# Patient Record
Sex: Female | Born: 1950 | Race: White | Hispanic: No | Smoking: Never smoker
Health system: Southern US, Community
[De-identification: ages and names within clinical notes are randomized; demographics above are authoritative.]

## PROBLEM LIST (undated history)

## (undated) DIAGNOSIS — Z923 Personal history of irradiation: Secondary | ICD-10-CM

## (undated) DIAGNOSIS — C50919 Malignant neoplasm of unspecified site of unspecified female breast: Secondary | ICD-10-CM

## (undated) DIAGNOSIS — M2540 Effusion, unspecified joint: Secondary | ICD-10-CM

## (undated) DIAGNOSIS — R351 Nocturia: Secondary | ICD-10-CM

## (undated) DIAGNOSIS — M1990 Unspecified osteoarthritis, unspecified site: Secondary | ICD-10-CM

## (undated) DIAGNOSIS — M2550 Pain in unspecified joint: Secondary | ICD-10-CM

## (undated) DIAGNOSIS — F419 Anxiety disorder, unspecified: Secondary | ICD-10-CM

## (undated) DIAGNOSIS — K219 Gastro-esophageal reflux disease without esophagitis: Secondary | ICD-10-CM

## (undated) DIAGNOSIS — E559 Vitamin D deficiency, unspecified: Secondary | ICD-10-CM

## (undated) DIAGNOSIS — F329 Major depressive disorder, single episode, unspecified: Secondary | ICD-10-CM

## (undated) DIAGNOSIS — F32A Depression, unspecified: Secondary | ICD-10-CM

## (undated) DIAGNOSIS — J302 Other seasonal allergic rhinitis: Secondary | ICD-10-CM

## (undated) HISTORY — PX: OTHER SURGICAL HISTORY: SHX169

## (undated) HISTORY — PX: COLONOSCOPY: SHX174

## (undated) HISTORY — DX: Personal history of irradiation: Z92.3

## (undated) HISTORY — DX: Malignant neoplasm of unspecified site of unspecified female breast: C50.919

---

## 1997-07-01 ENCOUNTER — Other Ambulatory Visit: Admission: RE | Admit: 1997-07-01 | Discharge: 1997-07-01 | Payer: Self-pay | Admitting: Obstetrics & Gynecology

## 2002-08-29 ENCOUNTER — Ambulatory Visit (HOSPITAL_COMMUNITY): Admission: RE | Admit: 2002-08-29 | Discharge: 2002-08-29 | Payer: Self-pay | Admitting: *Deleted

## 2002-10-17 ENCOUNTER — Emergency Department (HOSPITAL_COMMUNITY): Admission: EM | Admit: 2002-10-17 | Discharge: 2002-10-17 | Payer: Self-pay | Admitting: Emergency Medicine

## 2002-10-17 ENCOUNTER — Encounter: Payer: Self-pay | Admitting: Emergency Medicine

## 2002-10-19 ENCOUNTER — Ambulatory Visit (HOSPITAL_COMMUNITY): Admission: RE | Admit: 2002-10-19 | Discharge: 2002-10-19 | Payer: Self-pay | Admitting: Orthopedic Surgery

## 2002-10-19 ENCOUNTER — Ambulatory Visit (HOSPITAL_BASED_OUTPATIENT_CLINIC_OR_DEPARTMENT_OTHER): Admission: RE | Admit: 2002-10-19 | Discharge: 2002-10-19 | Payer: Self-pay | Admitting: Orthopedic Surgery

## 2002-10-19 ENCOUNTER — Encounter: Payer: Self-pay | Admitting: Orthopedic Surgery

## 2003-07-01 ENCOUNTER — Other Ambulatory Visit: Admission: RE | Admit: 2003-07-01 | Discharge: 2003-07-01 | Payer: Self-pay | Admitting: Family Medicine

## 2004-07-01 ENCOUNTER — Other Ambulatory Visit: Admission: RE | Admit: 2004-07-01 | Discharge: 2004-07-01 | Payer: Self-pay | Admitting: Family Medicine

## 2005-01-30 ENCOUNTER — Emergency Department (HOSPITAL_COMMUNITY): Admission: EM | Admit: 2005-01-30 | Discharge: 2005-01-30 | Payer: Self-pay | Admitting: Emergency Medicine

## 2005-07-01 ENCOUNTER — Other Ambulatory Visit: Admission: RE | Admit: 2005-07-01 | Discharge: 2005-07-01 | Payer: Self-pay | Admitting: Family Medicine

## 2006-07-14 ENCOUNTER — Other Ambulatory Visit: Admission: RE | Admit: 2006-07-14 | Discharge: 2006-07-14 | Payer: Self-pay | Admitting: Family Medicine

## 2007-07-17 ENCOUNTER — Other Ambulatory Visit: Admission: RE | Admit: 2007-07-17 | Discharge: 2007-07-17 | Payer: Self-pay | Admitting: Family Medicine

## 2008-07-17 ENCOUNTER — Other Ambulatory Visit: Admission: RE | Admit: 2008-07-17 | Discharge: 2008-07-17 | Payer: Self-pay | Admitting: Family Medicine

## 2009-07-29 ENCOUNTER — Other Ambulatory Visit: Admission: RE | Admit: 2009-07-29 | Discharge: 2009-07-29 | Payer: Self-pay | Admitting: Family Medicine

## 2010-05-22 NOTE — Op Note (Signed)
NAMEBRIGID, [name]                              ACCOUNT NO.:  [id number]   MEDICAL RECORD NO.:  [id number]                   PATIENT TYPE:  AMB   LOCATION:  DSC                                  FACILITY:  [facility]   PHYSICIAN:  Feliberto Gottron. Turner Daniels, M.D.                DATE OF BIRTH:  1950-07-21   DATE OF PROCEDURE:  10/20/2002  DATE OF DISCHARGE:                                 OPERATIVE REPORT   PREOPERATIVE DIAGNOSES:  Left distal tibia-fibula fracture, in addition a  posterior malleolus fracture.   POSTOPERATIVE DIAGNOSES:  Left distal tibia-fibula fracture, in addition a  posterior malleolus fracture.   PROCEDURE:  Open reduction and internal fixation of the left distal tibia-  fibula fracture using a, I believe, 34 cm x 8 mm DePuy Ace tibial nail with  two proximal locking screws and three distal locking screws.  The anterior  to posterior distal locking screw was able to reduce the posterior malleolus  fracture to within 1 mm of anatomic and hold it.   SURGEON:  Feliberto Gottron. Turner Daniels, M.D.   FIRST ASSISTANT:  Erskine Squibb B. Jannet Mantis.   ANESTHESIA:  General endotracheal.   ESTIMATED BLOOD LOSS:  Minimal.   FLUIDS REPLACED:  1 L crystalloid.   DRAINS PLACED:  None.   TOURNIQUET TIME:  None.   INDICATION FOR PROCEDURE:  A 60 year old woman who fell down two stairs and  sustained the above distal tibia-fibula fracture.  She was seen in the  office a couple of days ago and prepared for surgical stabilization for this  basically unstable distal tibia-fibula fracture.  She was also very unhappy  with the motion she felt of the bones inside of the splint.  Risks and  benefits of surgery well-understood by the patient, and she did receive 1 g  of Ancef perioperatively.   DESCRIPTION OF PROCEDURE:  The patient identified by arm band, taken to the  operating room at [facility] day surgery center.  Appropriate anesthetic monitors  were attached.  General endotracheal anesthesia induced with the  patient in  the supine position.  Tourniquet applied high to the left thigh but never  used.  The left lower extremity prepped and draped in the usual sterile  fashion from the toes to the tourniquet and the limb was then draped over  the large radiolucent triangle, holding it in about 150 degrees of flexion.  A 3 cm medial parapatellar wound was then made through the skin and  subcutaneous tissue and a medial parapatellar arthrotomy accomplished.  This  allowed placement of the DePuy awl into the proximal tibia in the center and  about a centimeter behind the anterior cortex where it joined up with the  tibial plateau.  Once this entry point had been established, a ball-tip  guidewire was threaded down to the tibia down to the fracture site and  threaded into the  center of the distal tibia on the AP and lateral views  using C-arm control.  We then reamed up to a 9.5 reamer and proximally up to  a 12 mm reamer to a depth of 5 cm.  We measured for a 34 cm x 8 mm Ace  tibial nail.  This was mounted on the driving platform.  The ball-tip wire  exchanged for the driving wire and the tibial nail inserted from proximal to  distal, obtaining good fit and fill proximally and distally on the AP and  lateral views.  Using the driving platform, the proximal screws were locked  with 5.5 mm screws and using a free-hand C-arm technique the distal screws  were placed, one anterior to posterior and two medial to lateral, obtaining  a near-anatomic reduction, and also the anterior to posterior screw was able  to skewer the posterior malleolus fragment and hold it in reduction within a  millimeter of anatomic.  The mortise was noted to be in good position after  the placement of the rod with the locking screws.  At this point the wounds  were washed out with normal saline solution.  The proximal insertion wound  was closed with subcutaneous 2-0 Vicryl suture and running interlocking 3-0  nylon suture.  The  screw insertion holes were closed with single loops of 3-  0 nylon suture.  A dressing of Xeroform, 4 x 4 dressing sponges, Webril, an  Ace wrap, and a Cam walker boot applied.  The patient was then awakened and  taken to the recovery room without difficulty.                                               Feliberto Gottron. Turner Daniels, M.D.    Ovid Curd  D:  10/19/2002  T:  10/20/2002  Job:  [id number]

## 2010-12-22 ENCOUNTER — Other Ambulatory Visit (HOSPITAL_COMMUNITY)
Admission: RE | Admit: 2010-12-22 | Discharge: 2010-12-22 | Disposition: A | Discharge status: Home / Self Care | Payer: BC Managed Care – PPO | Source: Ambulatory Visit | Attending: Family Medicine | Admitting: Family Medicine

## 2010-12-22 ENCOUNTER — Other Ambulatory Visit: Payer: Self-pay | Admitting: Family Medicine

## 2010-12-22 DIAGNOSIS — Z124 Encounter for screening for malignant neoplasm of cervix: Secondary | ICD-10-CM | POA: Insufficient documentation

## 2010-12-22 DIAGNOSIS — Z1159 Encounter for screening for other viral diseases: Secondary | ICD-10-CM | POA: Insufficient documentation

## 2012-01-27 ENCOUNTER — Other Ambulatory Visit: Payer: Self-pay | Admitting: Family Medicine

## 2012-01-27 ENCOUNTER — Other Ambulatory Visit (HOSPITAL_COMMUNITY)
Admission: RE | Admit: 2012-01-27 | Discharge: 2012-01-27 | Disposition: A | Discharge status: Home / Self Care | Payer: BC Managed Care – PPO | Source: Ambulatory Visit | Attending: Family Medicine | Admitting: Family Medicine

## 2012-01-27 DIAGNOSIS — Z124 Encounter for screening for malignant neoplasm of cervix: Secondary | ICD-10-CM | POA: Insufficient documentation

## 2012-01-27 DIAGNOSIS — Z1151 Encounter for screening for human papillomavirus (HPV): Secondary | ICD-10-CM | POA: Insufficient documentation

## 2013-07-18 ENCOUNTER — Other Ambulatory Visit: Payer: Self-pay | Admitting: Family Medicine

## 2013-07-18 ENCOUNTER — Other Ambulatory Visit (HOSPITAL_COMMUNITY)
Admission: RE | Admit: 2013-07-18 | Discharge: 2013-07-18 | Disposition: A | Discharge status: Home / Self Care | Payer: BC Managed Care – PPO | Source: Ambulatory Visit | Attending: Family Medicine | Admitting: Family Medicine

## 2013-07-18 DIAGNOSIS — Z1151 Encounter for screening for human papillomavirus (HPV): Secondary | ICD-10-CM | POA: Insufficient documentation

## 2013-07-18 DIAGNOSIS — R8789 Other abnormal findings in specimens from female genital organs: Secondary | ICD-10-CM | POA: Insufficient documentation

## 2013-07-18 DIAGNOSIS — R87810 Cervical high risk human papillomavirus (HPV) DNA test positive: Secondary | ICD-10-CM | POA: Insufficient documentation

## 2013-07-23 LAB — CYTOLOGY - PAP

## 2014-07-31 ENCOUNTER — Other Ambulatory Visit: Payer: Self-pay | Admitting: Family Medicine

## 2014-07-31 ENCOUNTER — Other Ambulatory Visit (HOSPITAL_COMMUNITY)
Admission: RE | Admit: 2014-07-31 | Discharge: 2014-07-31 | Disposition: A | Discharge status: Home / Self Care | Payer: BC Managed Care – PPO | Source: Ambulatory Visit | Attending: Family Medicine | Admitting: Family Medicine

## 2014-07-31 DIAGNOSIS — Z1151 Encounter for screening for human papillomavirus (HPV): Secondary | ICD-10-CM | POA: Diagnosis present

## 2014-07-31 DIAGNOSIS — R87619 Unspecified abnormal cytological findings in specimens from cervix uteri: Secondary | ICD-10-CM | POA: Insufficient documentation

## 2014-08-05 LAB — CYTOLOGY - PAP

## 2014-08-14 ENCOUNTER — Other Ambulatory Visit: Payer: Self-pay | Admitting: Gastroenterology

## 2015-07-23 ENCOUNTER — Other Ambulatory Visit: Payer: Self-pay | Admitting: Orthopedic Surgery

## 2015-08-06 ENCOUNTER — Other Ambulatory Visit (HOSPITAL_COMMUNITY)
Admission: RE | Admit: 2015-08-06 | Discharge: 2015-08-06 | Disposition: A | Discharge status: Home / Self Care | Payer: BC Managed Care – PPO | Source: Ambulatory Visit | Attending: Family Medicine | Admitting: Family Medicine

## 2015-08-06 ENCOUNTER — Other Ambulatory Visit: Payer: Self-pay | Admitting: Family Medicine

## 2015-08-06 DIAGNOSIS — Z124 Encounter for screening for malignant neoplasm of cervix: Secondary | ICD-10-CM | POA: Insufficient documentation

## 2015-08-06 DIAGNOSIS — Z1151 Encounter for screening for human papillomavirus (HPV): Secondary | ICD-10-CM | POA: Insufficient documentation

## 2015-08-08 LAB — CYTOLOGY - PAP

## 2015-08-22 ENCOUNTER — Ambulatory Visit (HOSPITAL_COMMUNITY)
Admission: RE | Admit: 2015-08-22 | Discharge: 2015-08-22 | Disposition: A | Discharge status: Home / Self Care | Payer: BC Managed Care – PPO | Source: Ambulatory Visit | Attending: Orthopedic Surgery | Admitting: Orthopedic Surgery

## 2015-08-22 ENCOUNTER — Encounter (HOSPITAL_COMMUNITY)
Admission: RE | Admit: 2015-08-22 | Discharge: 2015-08-22 | Disposition: A | Discharge status: Home / Self Care | Payer: BC Managed Care – PPO | Source: Ambulatory Visit | Attending: Orthopedic Surgery | Admitting: Orthopedic Surgery

## 2015-08-22 ENCOUNTER — Encounter (HOSPITAL_COMMUNITY): Payer: Self-pay

## 2015-08-22 DIAGNOSIS — Z01812 Encounter for preprocedural laboratory examination: Secondary | ICD-10-CM | POA: Insufficient documentation

## 2015-08-22 DIAGNOSIS — M1711 Unilateral primary osteoarthritis, right knee: Secondary | ICD-10-CM | POA: Diagnosis not present

## 2015-08-22 DIAGNOSIS — R9431 Abnormal electrocardiogram [ECG] [EKG]: Secondary | ICD-10-CM | POA: Insufficient documentation

## 2015-08-22 DIAGNOSIS — Z01818 Encounter for other preprocedural examination: Secondary | ICD-10-CM | POA: Insufficient documentation

## 2015-08-22 DIAGNOSIS — Z0183 Encounter for blood typing: Secondary | ICD-10-CM | POA: Insufficient documentation

## 2015-08-22 HISTORY — DX: Unspecified osteoarthritis, unspecified site: M19.90

## 2015-08-22 HISTORY — DX: Vitamin D deficiency, unspecified: E55.9

## 2015-08-22 HISTORY — DX: Nocturia: R35.1

## 2015-08-22 HISTORY — DX: Effusion, unspecified joint: M25.40

## 2015-08-22 HISTORY — DX: Anxiety disorder, unspecified: F41.9

## 2015-08-22 HISTORY — DX: Gastro-esophageal reflux disease without esophagitis: K21.9

## 2015-08-22 HISTORY — DX: Depression, unspecified: F32.A

## 2015-08-22 HISTORY — DX: Other seasonal allergic rhinitis: J30.2

## 2015-08-22 HISTORY — DX: Major depressive disorder, single episode, unspecified: F32.9

## 2015-08-22 HISTORY — DX: Pain in unspecified joint: M25.50

## 2015-08-22 LAB — BASIC METABOLIC PANEL
Anion gap: 6 (ref 5–15)
BUN: 10 mg/dL (ref 6–20)
CO2: 29 mmol/L (ref 22–32)
Calcium: 9.8 mg/dL (ref 8.9–10.3)
Chloride: 102 mmol/L (ref 101–111)
Creatinine, Ser: 0.87 mg/dL (ref 0.44–1.00)
GFR calc Af Amer: >60 mL/min (ref >60)
GFR calc non Af Amer: >60 mL/min (ref >60)
Glucose, Bld: 106 mg/dL — ABNORMAL HIGH (ref 65–99)
Potassium: 4 mmol/L (ref 3.5–5.1)
Sodium: 137 mmol/L (ref 135–145)

## 2015-08-22 LAB — SURGICAL PCR SCREEN
MRSA, PCR: NEGATIVE
Staphylococcus aureus: NEGATIVE

## 2015-08-22 LAB — URINALYSIS, ROUTINE W REFLEX MICROSCOPIC
Bilirubin Urine: NEGATIVE
Glucose, UA: NEGATIVE mg/dL
Hgb urine dipstick: NEGATIVE
Ketones, ur: NEGATIVE mg/dL
Leukocytes, UA: NEGATIVE
Nitrite: NEGATIVE
Protein, ur: NEGATIVE mg/dL
Specific Gravity, Urine: 1.018 (ref 1.005–1.030)
pH: 7 (ref 5.0–8.0)

## 2015-08-22 LAB — CBC WITH DIFFERENTIAL/PLATELET
Basophils Absolute: 0 10*3/uL (ref 0.0–0.1)
Basophils Relative: 1 %
Eosinophils Absolute: 0.3 10*3/uL (ref 0.0–0.7)
Eosinophils Relative: 7 %
HCT: 39.4 % (ref 36.0–46.0)
Hemoglobin: 13.1 g/dL (ref 12.0–15.0)
Lymphocytes Relative: 30 %
Lymphs Abs: 1.4 10*3/uL (ref 0.7–4.0)
MCH: 31.6 pg (ref 26.0–34.0)
MCHC: 33.2 g/dL (ref 30.0–36.0)
MCV: 94.9 fL (ref 78.0–100.0)
Monocytes Absolute: 0.3 10*3/uL (ref 0.1–1.0)
Monocytes Relative: 5 %
Neutro Abs: 2.6 10*3/uL (ref 1.7–7.7)
Neutrophils Relative %: 57 %
Platelets: 373 10*3/uL (ref 150–400)
RBC: 4.15 MIL/uL (ref 3.87–5.11)
RDW: 13 % (ref 11.5–15.5)
WBC: 4.6 10*3/uL (ref 4.0–10.5)

## 2015-08-22 LAB — PROTIME-INR
INR: 1.03
Prothrombin Time: 13.5 seconds (ref 11.4–15.2)

## 2015-08-22 LAB — TYPE AND SCREEN
ABO/RH(D): O POS
Antibody Screen: NEGATIVE

## 2015-08-22 LAB — APTT: aPTT: 29 seconds (ref 24–36)

## 2015-08-22 LAB — ABO/RH: ABO/RH(D): O POS

## 2015-08-22 MED ORDER — CHLORHEXIDINE GLUCONATE 4 % EX LIQD: 60.0000 mL | Freq: Once | CUTANEOUS | Status: DC

## 2015-08-22 NOTE — Progress Notes (Signed)
Cardiologist denies  Medical Md is Dr.Kevin Little  Echo denies  Stress test denies  Heart cath denies  EKG denies in past yr  CXR denies

## 2015-08-22 NOTE — Pre-Procedure Instructions (Signed)
[name]  08/22/2015     No Pharmacies Listed   Your procedure is scheduled on Mon, Aug 28 @ 12:30 PM  Report to [facility] Admitting at 10:30 AM  Call this number if you have problems the morning of surgery:  [phone]   Remember:  Do not eat food or drink liquids after midnight.  Take these medicines the morning of surgery with A SIP OF WATER Desvenlafaxine(Effexor),Bupropion(Wellbutrin), Alprazolam(Xanax),Azelastine Spray,and Prevacid              No Goody's,BC's,Aleve,Advil,Motrin,Ibuprofen,Fish Oil,or any Herbal Medications.   Do not wear jewelry, make-up or nail polish.  Do not wear lotions, powders, or perfumes.    Do not shave 48 hours prior to surgery.    Do not bring valuables to the hospital.  [facility] is not responsible for any belongings or valuables.  Contacts, dentures or bridgework may not be worn into surgery.  Leave your suitcase in the car.  After surgery it may be brought to your room.  For patients admitted to the hospital, discharge time will be determined by your treatment team.  Patients discharged the day of surgery will not be allowed to drive home.    [facility]  Before surgery, you can play an important role.  Because skin is not sterile, your skin needs to be as free of germs as possible.  You can reduce the number of germs on you skin by washing with CHG (chlorahexidine gluconate) soap before surgery.  CHG is an antiseptic cleaner which kills germs and bonds with the skin to continue killing germs even after washing.  Please DO NOT use if you have an allergy to CHG or antibacterial soaps.  If your skin becomes reddened/irritated stop using the CHG and inform your nurse when you arrive at Short Stay.  Do not shave (including legs and underarms) for at least 48 hours prior to the first CHG shower.  You may shave your face.  Please follow these instructions carefully:   1.  Shower with  CHG Soap the night before surgery and the                                morning of Surgery.  2.  If you choose to wash your hair, wash your hair first as usual with your       normal shampoo.  3.  After you shampoo, rinse your hair and body thoroughly to remove the                      Shampoo.  4.  Use CHG as you would any other liquid soap.  You can apply chg directly       to the skin and wash gently with scrungie or a clean washcloth.  5.  Apply the CHG Soap to your body ONLY FROM THE NECK DOWN.        Do not use on open wounds or open sores.  Avoid contact with your eyes,       ears, mouth and genitals (private parts).  Wash genitals (private parts)       with your normal soap.  6.  Wash thoroughly, paying special attention to the area where your surgery        will be performed.  7.  Thoroughly rinse your body with warm water from the neck  down.  8.  DO NOT shower/wash with your normal soap after using and rinsing off       the CHG Soap.  9.  Pat yourself dry with a clean towel.            10.  Wear clean pajamas.            11.  Place clean sheets on your bed the night of your first shower and do not        sleep with pets.  Day of Surgery  Do not apply any lotions/deoderants the morning of surgery.  Please wear clean clothes to the hospital/surgery center.    Please read over the following fact sheets that you were given. Pain Booklet, Coughing and Deep Breathing and MRSA Information

## 2015-08-28 DIAGNOSIS — M1711 Unilateral primary osteoarthritis, right knee: Secondary | ICD-10-CM | POA: Diagnosis present

## 2015-08-28 NOTE — H&P (Signed)
TOTAL KNEE ADMISSION H&P  Patient is being admitted for right total knee arthroplasty.  Subjective:  Chief Complaint:right knee pain.  HPI: [name], 65 y.o. female, has a history of pain and functional disability in the right knee due to arthritis and has failed non-surgical conservative treatments for greater than 12 weeks to includeNSAID's and/or analgesics, corticosteriod injections, viscosupplementation injections, flexibility and strengthening excercises, weight reduction as appropriate and activity modification.  Onset of symptoms was gradual, starting 6 years ago with gradually worsening course since that time. The patient noted no past surgery on the right knee(s).  Patient currently rates pain in the right knee(s) at 10 out of 10 with activity. Patient has night pain, worsening of pain with activity and weight bearing, pain that interferes with activities of daily living and pain with passive range of motion.  Patient has evidence of periarticular osteophytes and joint space narrowing by imaging studies.   There is no active infection.  There are no active problems to display for this patient.  Past Medical History:  Diagnosis Date  . Anxiety    takes Xanax daily   . Arthritis   . Depression    takes Pristiq and Wellbutrin daily  . GERD (gastroesophageal reflux disease)   . Joint pain   . Joint swelling   . Nocturia   . Seasonal allergies    Takes Zyrtec nightly along with Nasal Spray  . Vitamin D deficiency    new script for Vit D to start 08/22/15    Past Surgical History:  Procedure Laterality Date  . blephorplasty Bilateral   . COLONOSCOPY    . left leg surgery     rod    No prescriptions prior to admission.   Allergies  Allergen Reactions  . Codeine     Sick to stomach    Social History  Substance Use Topics  . Smoking status: Never Smoker  . Smokeless tobacco: Never Used  . Alcohol use Yes     Comment: couple times a wk    No family history on file.    Review of Systems  Constitutional: Negative.   HENT: Negative.   Eyes: Negative.   Respiratory: Negative.   Cardiovascular: Negative.   Gastrointestinal: Negative.   Genitourinary: Negative.   Musculoskeletal: Positive for joint pain.  Skin: Negative.   Neurological: Negative.   Endo/Heme/Allergies: Negative.   Psychiatric/Behavioral: Positive for depression. The patient is nervous/anxious.     Objective:  Physical Exam  Constitutional: She is oriented to person, place, and time. She appears well-developed and well-nourished.  HENT:  Head: Normocephalic and atraumatic.  Eyes: Pupils are equal, round, and reactive to light.  Neck: Normal range of motion.  Cardiovascular: Intact distal pulses.   Respiratory: Effort normal.  Musculoskeletal: She exhibits tenderness.  Obvious valgus deformity to the right knee.  Nontender along the medial collateral ligament.  Range of motion of the right knee is 0/130 collateral ligaments are stable, and again no discomfort over the MCL.  Her pain is exacerbated with valgus stress.  There is crepitus as you take her through range of motion and she is limping.  Neurological: She is alert and oriented to person, place, and time.  Skin: Skin is warm and dry.  Psychiatric: She has a normal mood and affect. Her behavior is normal. Judgment and thought content normal.    Vital signs in last 24 hours:    Labs:   There is no height or weight on file to calculate BMI.  Imaging Review Plain radiographs demonstrate AP, Rosenberg, lateral and sunrise x-rays show end-stage arthritis lateral compartment and less so patellofemoral compartment of the right knee bone-on-bone with erosion of the lateral femoral condyle evident.  Valgus deformity is greater than 12.   Assessment/Plan:  End stage arthritis, right knee   The patient history, physical examination, clinical judgment of the provider and imaging studies are consistent with end stage  degenerative joint disease of the right knee(s) and total knee arthroplasty is deemed medically necessary. The treatment options including medical management, injection therapy arthroscopy and arthroplasty were discussed at length. The risks and benefits of total knee arthroplasty were presented and reviewed. The risks due to aseptic loosening, infection, stiffness, patella tracking problems, thromboembolic complications and other imponderables were discussed. The patient acknowledged the explanation, agreed to proceed with the plan and consent was signed. Patient is being admitted for inpatient treatment for surgery, pain control, PT, OT, prophylactic antibiotics, VTE prophylaxis, progressive ambulation and ADL's and discharge planning. The patient is planning to be discharged home with home health services

## 2015-08-29 MED ORDER — TRANEXAMIC ACID 1000 MG/10ML IV SOLN
1000.0000 mg | INTRAVENOUS | Status: AC
  Administered 2015-09-01: 1000 mg via INTRAVENOUS
  Filled 2015-08-29: qty 10

## 2015-08-29 MED ORDER — DEXTROSE-NACL 5-0.45 % IV SOLN: INTRAVENOUS | Status: DC

## 2015-08-29 MED ORDER — BUPIVACAINE LIPOSOME 1.3 % IJ SUSP
20.0000 mL | Freq: Once | INTRAMUSCULAR | Status: AC
  Administered 2015-09-01: 20 mL
  Filled 2015-08-29: qty 20

## 2015-08-29 MED ORDER — TRANEXAMIC ACID 1000 MG/10ML IV SOLN
2000.0000 mg | Freq: Once | INTRAVENOUS | Status: DC
  Filled 2015-08-29: qty 20

## 2015-08-29 MED ORDER — CEFAZOLIN SODIUM-DEXTROSE 2-4 GM/100ML-% IV SOLN
2.0000 g | INTRAVENOUS | Status: AC
  Administered 2015-09-01: 2 g via INTRAVENOUS
  Filled 2015-08-29: qty 100

## 2015-09-01 ENCOUNTER — Inpatient Hospital Stay (HOSPITAL_COMMUNITY)
Admission: RE | Admit: 2015-09-01 | Discharge: 2015-09-03 | DRG: 470 | Disposition: A | Discharge status: Skilled Nursing Facility | Payer: BC Managed Care – PPO | Source: Ambulatory Visit | Attending: Orthopedic Surgery | Admitting: Orthopedic Surgery

## 2015-09-01 ENCOUNTER — Encounter (HOSPITAL_COMMUNITY)
Admission: RE | Disposition: A | Discharge status: Skilled Nursing Facility | Payer: Self-pay | Source: Ambulatory Visit | Attending: Orthopedic Surgery

## 2015-09-01 ENCOUNTER — Encounter (HOSPITAL_COMMUNITY): Payer: Self-pay | Admitting: *Deleted

## 2015-09-01 ENCOUNTER — Inpatient Hospital Stay (HOSPITAL_COMMUNITY): Payer: BC Managed Care – PPO | Admitting: Anesthesiology

## 2015-09-01 DIAGNOSIS — K219 Gastro-esophageal reflux disease without esophagitis: Secondary | ICD-10-CM | POA: Diagnosis present

## 2015-09-01 DIAGNOSIS — M1711 Unilateral primary osteoarthritis, right knee: Secondary | ICD-10-CM | POA: Diagnosis present

## 2015-09-01 DIAGNOSIS — F419 Anxiety disorder, unspecified: Secondary | ICD-10-CM | POA: Diagnosis present

## 2015-09-01 DIAGNOSIS — D62 Acute posthemorrhagic anemia: Secondary | ICD-10-CM | POA: Diagnosis not present

## 2015-09-01 DIAGNOSIS — F329 Major depressive disorder, single episode, unspecified: Secondary | ICD-10-CM | POA: Diagnosis present

## 2015-09-01 DIAGNOSIS — E559 Vitamin D deficiency, unspecified: Secondary | ICD-10-CM | POA: Diagnosis present

## 2015-09-01 DIAGNOSIS — J302 Other seasonal allergic rhinitis: Secondary | ICD-10-CM | POA: Diagnosis present

## 2015-09-01 DIAGNOSIS — M25561 Pain in right knee: Secondary | ICD-10-CM | POA: Diagnosis present

## 2015-09-01 DIAGNOSIS — Z885 Allergy status to narcotic agent status: Secondary | ICD-10-CM

## 2015-09-01 HISTORY — PX: TOTAL KNEE ARTHROPLASTY: SHX125

## 2015-09-01 SURGERY — ARTHROPLASTY, KNEE, TOTAL
Anesthesia: Regional | Site: Knee | Laterality: Right

## 2015-09-01 MED ORDER — CETIRIZINE HCL 10 MG PO TABS
10.0000 mg | ORAL_TABLET | Freq: Every day | ORAL | Status: DC
  Administered 2015-09-01 – 2015-09-03 (×3): 10 mg via ORAL
  Filled 2015-09-01 (×3): qty 1

## 2015-09-01 MED ORDER — LORATADINE 10 MG PO TABS: 10.0000 mg | ORAL_TABLET | Freq: Every day | ORAL | Status: DC

## 2015-09-01 MED ORDER — MIDAZOLAM HCL 2 MG/2ML IJ SOLN
INTRAMUSCULAR | Status: AC
  Filled 2015-09-01: qty 2

## 2015-09-01 MED ORDER — PANTOPRAZOLE SODIUM 20 MG PO TBEC
20.0000 mg | DELAYED_RELEASE_TABLET | Freq: Every day | ORAL | Status: DC
Start: 2015-09-02 — End: 2015-09-03
  Administered 2015-09-02 – 2015-09-03 (×2): 20 mg via ORAL
  Filled 2015-09-01 (×2): qty 1

## 2015-09-01 MED ORDER — LACTATED RINGERS IV SOLN
INTRAVENOUS | Status: DC | PRN
  Administered 2015-09-01 (×2): via INTRAVENOUS

## 2015-09-01 MED ORDER — AZELASTINE HCL 0.1 % NA SOLN
1.0000 | Freq: Two times a day (BID) | NASAL | Status: DC
  Administered 2015-09-01 – 2015-09-03 (×3): 1 via NASAL
  Filled 2015-09-01: qty 30

## 2015-09-01 MED ORDER — ACETAMINOPHEN 325 MG PO TABS: 650.0000 mg | ORAL_TABLET | Freq: Four times a day (QID) | ORAL | Status: DC | PRN

## 2015-09-01 MED ORDER — MIDAZOLAM HCL 2 MG/2ML IJ SOLN
INTRAMUSCULAR | Status: AC
  Administered 2015-09-01: 2 mg via INTRAVENOUS
  Filled 2015-09-01: qty 2

## 2015-09-01 MED ORDER — PHENOL 1.4 % MT LIQD: 1.0000 | OROMUCOSAL | Status: DC | PRN

## 2015-09-01 MED ORDER — HYDROMORPHONE HCL 1 MG/ML IJ SOLN
1.0000 mg | INTRAMUSCULAR | Status: DC | PRN
  Administered 2015-09-02 (×3): 1 mg via INTRAVENOUS
  Filled 2015-09-01 (×4): qty 1

## 2015-09-01 MED ORDER — METOCLOPRAMIDE HCL 5 MG/ML IJ SOLN: 5.0000 mg | Freq: Three times a day (TID) | INTRAMUSCULAR | Status: DC | PRN

## 2015-09-01 MED ORDER — ACETAMINOPHEN 650 MG RE SUPP: 650.0000 mg | Freq: Four times a day (QID) | RECTAL | Status: DC | PRN

## 2015-09-01 MED ORDER — DESVENLAFAXINE SUCCINATE ER 50 MG PO TB24
50.0000 mg | ORAL_TABLET | Freq: Every day | ORAL | Status: DC
  Administered 2015-09-02 – 2015-09-03 (×2): 50 mg via ORAL
  Filled 2015-09-01 (×2): qty 1

## 2015-09-01 MED ORDER — SODIUM CHLORIDE 0.9 % IJ SOLN
INTRAMUSCULAR | Status: DC | PRN
  Administered 2015-09-01: 50 mL

## 2015-09-01 MED ORDER — BUPROPION HCL ER (XL) 150 MG PO TB24
450.0000 mg | ORAL_TABLET | Freq: Every day | ORAL | Status: DC
  Administered 2015-09-02 – 2015-09-03 (×2): 450 mg via ORAL
  Filled 2015-09-01 (×2): qty 3

## 2015-09-01 MED ORDER — ASPIRIN EC 325 MG PO TBEC: 325.0000 mg | DELAYED_RELEASE_TABLET | Freq: Two times a day (BID) | ORAL | 0 refills | Status: DC

## 2015-09-01 MED ORDER — BUPIVACAINE-EPINEPHRINE (PF) 0.25% -1:200000 IJ SOLN
INTRAMUSCULAR | Status: DC | PRN
  Administered 2015-09-01: 50 mL

## 2015-09-01 MED ORDER — ALUM & MAG HYDROXIDE-SIMETH 200-200-20 MG/5ML PO SUSP: 30.0000 mL | ORAL | Status: DC | PRN

## 2015-09-01 MED ORDER — SODIUM CHLORIDE 0.9 % IR SOLN
Status: DC | PRN
  Administered 2015-09-01: 3000 mL

## 2015-09-01 MED ORDER — PROPOFOL 10 MG/ML IV BOLUS
INTRAVENOUS | Status: DC | PRN
  Administered 2015-09-01 (×2): 25 mg via INTRAVENOUS

## 2015-09-01 MED ORDER — MIDAZOLAM HCL 5 MG/5ML IJ SOLN
INTRAMUSCULAR | Status: DC | PRN
  Administered 2015-09-01 (×2): 2 mg via INTRAVENOUS

## 2015-09-01 MED ORDER — SENNOSIDES-DOCUSATE SODIUM 8.6-50 MG PO TABS
1.0000 | ORAL_TABLET | Freq: Every evening | ORAL | Status: DC | PRN
Start: 2015-09-01 — End: 2015-09-03
  Administered 2015-09-02: 1 via ORAL
  Filled 2015-09-01: qty 1

## 2015-09-01 MED ORDER — PROMETHAZINE HCL 25 MG/ML IJ SOLN: 6.2500 mg | INTRAMUSCULAR | Status: DC | PRN

## 2015-09-01 MED ORDER — ONDANSETRON HCL 4 MG PO TABS: 4.0000 mg | ORAL_TABLET | Freq: Four times a day (QID) | ORAL | Status: DC | PRN

## 2015-09-01 MED ORDER — METHOCARBAMOL 500 MG PO TABS
500.0000 mg | ORAL_TABLET | Freq: Four times a day (QID) | ORAL | Status: DC | PRN
  Administered 2015-09-01 – 2015-09-02 (×3): 500 mg via ORAL
  Filled 2015-09-01 (×3): qty 1

## 2015-09-01 MED ORDER — ALPRAZOLAM 0.5 MG PO TABS
0.5000 mg | ORAL_TABLET | Freq: Two times a day (BID) | ORAL | Status: DC | PRN
  Administered 2015-09-01 – 2015-09-03 (×3): 0.5 mg via ORAL
  Filled 2015-09-01 (×3): qty 1

## 2015-09-01 MED ORDER — BUPIVACAINE HCL (PF) 0.75 % IJ SOLN
INTRAMUSCULAR | Status: DC | PRN
  Administered 2015-09-01: 2 mL via INTRATHECAL

## 2015-09-01 MED ORDER — DIPHENHYDRAMINE HCL 12.5 MG/5ML PO ELIX: 12.5000 mg | ORAL_SOLUTION | ORAL | Status: DC | PRN

## 2015-09-01 MED ORDER — FENTANYL CITRATE (PF) 100 MCG/2ML IJ SOLN
INTRAMUSCULAR | Status: AC
  Administered 2015-09-01: 75 ug via INTRAVENOUS
  Filled 2015-09-01: qty 2

## 2015-09-01 MED ORDER — METHOCARBAMOL 500 MG PO TABS: 500.0000 mg | ORAL_TABLET | Freq: Two times a day (BID) | ORAL | 0 refills | Status: DC

## 2015-09-01 MED ORDER — STERILE WATER FOR IRRIGATION IR SOLN
Status: DC | PRN
  Administered 2015-09-01: 1000 mL

## 2015-09-01 MED ORDER — MENTHOL 3 MG MT LOZG
1.0000 | LOZENGE | OROMUCOSAL | Status: DC | PRN
Start: 2015-09-01 — End: 2015-09-03

## 2015-09-01 MED ORDER — HYDROMORPHONE HCL 1 MG/ML IJ SOLN
INTRAMUSCULAR | Status: AC
  Filled 2015-09-01: qty 1

## 2015-09-01 MED ORDER — OXYCODONE-ACETAMINOPHEN 5-325 MG PO TABS: 1.0000 | ORAL_TABLET | ORAL | 0 refills | Status: DC | PRN

## 2015-09-01 MED ORDER — LACTATED RINGERS IV SOLN
INTRAVENOUS | Status: DC
  Administered 2015-09-01: 10:00:00 via INTRAVENOUS

## 2015-09-01 MED ORDER — ZOLPIDEM TARTRATE 5 MG PO TABS
5.0000 mg | ORAL_TABLET | Freq: Every evening | ORAL | Status: DC | PRN
  Administered 2015-09-01 – 2015-09-02 (×2): 5 mg via ORAL
  Filled 2015-09-01 (×2): qty 1

## 2015-09-01 MED ORDER — FENTANYL CITRATE (PF) 100 MCG/2ML IJ SOLN
75.0000 ug | Freq: Once | INTRAMUSCULAR | Status: AC
  Administered 2015-09-01: 75 ug via INTRAVENOUS

## 2015-09-01 MED ORDER — MIDAZOLAM HCL 2 MG/2ML IJ SOLN
2.0000 mg | Freq: Once | INTRAMUSCULAR | Status: AC
  Administered 2015-09-01: 2 mg via INTRAVENOUS

## 2015-09-01 MED ORDER — DOCUSATE SODIUM 100 MG PO CAPS
100.0000 mg | ORAL_CAPSULE | Freq: Two times a day (BID) | ORAL | Status: DC
  Administered 2015-09-01 – 2015-09-03 (×4): 100 mg via ORAL
  Filled 2015-09-01 (×4): qty 1

## 2015-09-01 MED ORDER — METHOCARBAMOL 1000 MG/10ML IJ SOLN: 500.0000 mg | Freq: Four times a day (QID) | INTRAVENOUS | Status: DC | PRN

## 2015-09-01 MED ORDER — KCL IN DEXTROSE-NACL 20-5-0.45 MEQ/L-%-% IV SOLN
INTRAVENOUS | Status: DC
  Administered 2015-09-01: 17:00:00 via INTRAVENOUS
  Administered 2015-09-02: 125 mL/h via INTRAVENOUS
  Filled 2015-09-01 (×2): qty 1000

## 2015-09-01 MED ORDER — HYDROMORPHONE HCL 1 MG/ML IJ SOLN
0.2500 mg | INTRAMUSCULAR | Status: DC | PRN
  Administered 2015-09-01 (×2): 0.5 mg via INTRAVENOUS

## 2015-09-01 MED ORDER — FLEET ENEMA 7-19 GM/118ML RE ENEM: 1.0000 | ENEMA | Freq: Once | RECTAL | Status: DC | PRN

## 2015-09-01 MED ORDER — ONDANSETRON HCL 4 MG/2ML IJ SOLN
4.0000 mg | Freq: Four times a day (QID) | INTRAMUSCULAR | Status: DC | PRN
  Administered 2015-09-02 (×3): 4 mg via INTRAVENOUS
  Filled 2015-09-01 (×3): qty 2

## 2015-09-01 MED ORDER — OXYCODONE HCL 5 MG PO TABS
5.0000 mg | ORAL_TABLET | ORAL | Status: DC | PRN
  Administered 2015-09-01 – 2015-09-03 (×10): 10 mg via ORAL
  Filled 2015-09-01 (×10): qty 2

## 2015-09-01 MED ORDER — ASPIRIN EC 325 MG PO TBEC
325.0000 mg | DELAYED_RELEASE_TABLET | Freq: Every day | ORAL | Status: DC
  Administered 2015-09-02 – 2015-09-03 (×2): 325 mg via ORAL
  Filled 2015-09-01 (×2): qty 1

## 2015-09-01 MED ORDER — METOCLOPRAMIDE HCL 5 MG PO TABS: 5.0000 mg | ORAL_TABLET | Freq: Three times a day (TID) | ORAL | Status: DC | PRN

## 2015-09-01 MED ORDER — HYDROCODONE-ACETAMINOPHEN 7.5-325 MG PO TABS: 1.0000 | ORAL_TABLET | Freq: Once | ORAL | Status: DC | PRN

## 2015-09-01 MED ORDER — FENTANYL CITRATE (PF) 100 MCG/2ML IJ SOLN
INTRAMUSCULAR | Status: AC
  Filled 2015-09-01: qty 2

## 2015-09-01 MED ORDER — FENTANYL CITRATE (PF) 100 MCG/2ML IJ SOLN
INTRAMUSCULAR | Status: DC | PRN
  Administered 2015-09-01: 100 ug via INTRAVENOUS

## 2015-09-01 MED ORDER — PROPOFOL 500 MG/50ML IV EMUL
INTRAVENOUS | Status: DC | PRN
  Administered 2015-09-01: 100 ug/kg/min via INTRAVENOUS

## 2015-09-01 MED ORDER — ROPIVACAINE HCL 5 MG/ML IJ SOLN
INTRAMUSCULAR | Status: DC | PRN
  Administered 2015-09-01: 25 mL via PERINEURAL

## 2015-09-01 MED ORDER — BISACODYL 5 MG PO TBEC: 5.0000 mg | DELAYED_RELEASE_TABLET | Freq: Every day | ORAL | Status: DC | PRN

## 2015-09-01 MED ORDER — ZOLPIDEM TARTRATE 5 MG PO TABS: 10.0000 mg | ORAL_TABLET | Freq: Every evening | ORAL | Status: DC | PRN

## 2015-09-01 SURGICAL SUPPLY — 58 items
BANDAGE ACE 6X5 VEL STRL LF (GAUZE/BANDAGES/DRESSINGS) ×1 IMPLANT
BANDAGE ESMARK 6X9 LF (GAUZE/BANDAGES/DRESSINGS) ×1 IMPLANT
BLADE SAG 18X100X1.27 (BLADE) ×2 IMPLANT
BLADE SAW SGTL 13X75X1.27 (BLADE) ×2 IMPLANT
BLADE SURG ROTATE 9660 (MISCELLANEOUS) IMPLANT
BNDG CMPR 9X6 STRL LF SNTH (GAUZE/BANDAGES/DRESSINGS) ×1
BNDG CMPR MED 10X6 ELC LF (GAUZE/BANDAGES/DRESSINGS) ×1
BNDG ELASTIC 6X10 VLCR STRL LF (GAUZE/BANDAGES/DRESSINGS) ×2 IMPLANT
BNDG ESMARK 6X9 LF (GAUZE/BANDAGES/DRESSINGS) ×2
BOWL SMART MIX CTS (DISPOSABLE) ×2 IMPLANT
CAPT KNEE TOTAL 3 ATTUNE ×1 IMPLANT
CEMENT HV SMART SET (Cement) ×4 IMPLANT
COVER SURGICAL LIGHT HANDLE (MISCELLANEOUS) ×2 IMPLANT
CUFF TOURNIQUET SINGLE 34IN LL (TOURNIQUET CUFF) ×2 IMPLANT
CUFF TOURNIQUET SINGLE 44IN (TOURNIQUET CUFF) IMPLANT
DRAPE EXTREMITY T 121X128X90 (DRAPE) ×2 IMPLANT
DRAPE PROXIMA HALF (DRAPES) ×1 IMPLANT
DRAPE U-SHAPE 47X51 STRL (DRAPES) ×2 IMPLANT
DRSG AQUACEL AG ADV 3.5X10 (GAUZE/BANDAGES/DRESSINGS) ×2 IMPLANT
DURAPREP 26ML APPLICATOR (WOUND CARE) ×4 IMPLANT
ELECT REM PT RETURN 9FT ADLT (ELECTROSURGICAL) ×2
ELECTRODE REM PT RTRN 9FT ADLT (ELECTROSURGICAL) ×1 IMPLANT
EVACUATOR 1/8 PVC DRAIN (DRAIN) IMPLANT
GLOVE BIO SURGEON STRL SZ7.5 (GLOVE) ×4 IMPLANT
GLOVE BIO SURGEON STRL SZ8.5 (GLOVE) ×4 IMPLANT
GLOVE BIOGEL PI IND STRL 6.5 (GLOVE) IMPLANT
GLOVE BIOGEL PI IND STRL 8 (GLOVE) ×1 IMPLANT
GLOVE BIOGEL PI IND STRL 9 (GLOVE) ×1 IMPLANT
GLOVE BIOGEL PI INDICATOR 6.5 (GLOVE) ×3
GLOVE BIOGEL PI INDICATOR 8 (GLOVE) ×1
GLOVE BIOGEL PI INDICATOR 9 (GLOVE) ×1
GOWN STRL REUS W/ TWL LRG LVL3 (GOWN DISPOSABLE) ×1 IMPLANT
GOWN STRL REUS W/ TWL XL LVL3 (GOWN DISPOSABLE) ×2 IMPLANT
GOWN STRL REUS W/TWL LRG LVL3 (GOWN DISPOSABLE) ×2
GOWN STRL REUS W/TWL XL LVL3 (GOWN DISPOSABLE) ×4
HANDPIECE INTERPULSE COAX TIP (DISPOSABLE) ×2
HOOD PEEL AWAY FACE SHEILD DIS (HOOD) ×4 IMPLANT
KIT BASIN OR (CUSTOM PROCEDURE TRAY) ×2 IMPLANT
KIT ROOM TURNOVER OR (KITS) ×2 IMPLANT
MANIFOLD NEPTUNE II (INSTRUMENTS) ×3 IMPLANT
NEEDLE 22X1 1/2 (OR ONLY) (NEEDLE) ×4 IMPLANT
NS IRRIG 1000ML POUR BTL (IV SOLUTION) ×2 IMPLANT
PACK TOTAL JOINT (CUSTOM PROCEDURE TRAY) ×2 IMPLANT
PAD ARMBOARD 7.5X6 YLW CONV (MISCELLANEOUS) ×4 IMPLANT
SET HNDPC FAN SPRY TIP SCT (DISPOSABLE) ×1 IMPLANT
SUT VIC AB 0 CT1 27 (SUTURE) ×2
SUT VIC AB 0 CT1 27XBRD ANBCTR (SUTURE) ×1 IMPLANT
SUT VIC AB 1 CTX 36 (SUTURE) ×2
SUT VIC AB 1 CTX36XBRD ANBCTR (SUTURE) ×1 IMPLANT
SUT VIC AB 2-0 CT1 27 (SUTURE) ×2
SUT VIC AB 2-0 CT1 TAPERPNT 27 (SUTURE) IMPLANT
SUT VIC AB 3-0 CT1 27 (SUTURE) ×2
SUT VIC AB 3-0 CT1 TAPERPNT 27 (SUTURE) ×1 IMPLANT
SYR CONTROL 10ML LL (SYRINGE) ×4 IMPLANT
TOWEL OR 17X24 6PK STRL BLUE (TOWEL DISPOSABLE) ×2 IMPLANT
TOWEL OR 17X26 10 PK STRL BLUE (TOWEL DISPOSABLE) ×2 IMPLANT
TRAY CATH 16FR W/PLASTIC CATH (SET/KITS/TRAYS/PACK) ×1 IMPLANT
WATER STERILE IRR 1000ML POUR (IV SOLUTION) ×6 IMPLANT

## 2015-09-01 NOTE — Transfer of Care (Signed)
Immediate Anesthesia Transfer of Care Note  Patient: [name]  Procedure(s) Performed: Procedure(s): TOTAL KNEE ARTHROPLASTY (Right)  Patient Location: PACU  Anesthesia Type:Spinal  Level of Consciousness: awake, alert  and oriented  Airway & Oxygen Therapy: Patient Spontanous Breathing and Patient connected to nasal cannula oxygen  Post-op Assessment: Report given to RN and Post -op Vital signs reviewed and stable  Post vital signs: Reviewed and stable  Last Vitals:  Vitals:   09/01/15 1057 09/01/15 1430  BP: (!) 156/84 (!) 144/89  Pulse: 83 73  Resp: 13 (!) 21  Temp:  36.3 C    Last Pain: There were no vitals filed for this visit.       Complications: No apparent anesthesia complications

## 2015-09-01 NOTE — Progress Notes (Signed)
Orthopedic Tech Progress Note Patient Details:  [name] 10-09-1950 [id number]  CPM Right Knee CPM Right Knee: On Right Knee Flexion (Degrees): 40 Right Knee Extension (Degrees): 0  Ortho Devices Ortho Device/Splint Location: applied ohf footsie roll  Ortho Device/Splint Interventions: Ordered, Application   Braulio Bosch 09/01/2015, 4:30 PM

## 2015-09-01 NOTE — Anesthesia Procedure Notes (Signed)
Spinal  Patient location during procedure: OR Staffing Anesthesiologist: JUDD, BENJAMIN Performed: anesthesiologist  Preanesthetic Checklist Completed: patient identified, site marked, surgical consent, pre-op evaluation, timeout performed, IV checked, risks and benefits discussed and monitors and equipment checked Spinal Block Patient position: sitting Prep: DuraPrep Patient monitoring: heart rate, cardiac monitor, continuous pulse ox and blood pressure Approach: midline Location: L4-5 Needle Needle type: Quincke  Needle gauge: 22 G Additional Notes Functioning IV was confirmed and monitors were applied. Sterile prep and drape, including hand hygiene, mask and sterile gloves were used. The patient was positioned and the spine was prepped. The skin was anesthetized with lidocaine.  Free flow of clear CSF was obtained prior to injecting local anesthetic into the CSF.  The spinal needle aspirated freely following injection.  The needle was carefully withdrawn.  The patient tolerated the procedure well. Consent was obtained prior to procedure with all questions answered and concerns addressed.  Challenging spinal due to poor positioning and lumbar osteoarthritis   Destiny Pink, MD

## 2015-09-01 NOTE — Anesthesia Preprocedure Evaluation (Signed)
Anesthesia Evaluation  Patient identified by MRN, date of birth, ID band Patient awake    Reviewed: Allergy & Precautions, H&P , NPO status , Patient's Chart, lab work & pertinent test results  History of Anesthesia Complications Negative for: history of anesthetic complications  Airway Mallampati: II  TM Distance: >3 FB Neck ROM: full    Dental no notable dental hx.    Pulmonary neg pulmonary ROS,    Pulmonary exam normal breath sounds clear to auscultation       Cardiovascular negative cardio ROS Normal cardiovascular exam Rhythm:regular Rate:Normal     Neuro/Psych PSYCHIATRIC DISORDERS Anxiety Depression negative neurological ROS     GI/Hepatic Neg liver ROS, GERD  ,  Endo/Other  negative endocrine ROS  Renal/GU negative Renal ROS     Musculoskeletal  (+) Arthritis ,   Abdominal   Peds  Hematology negative hematology ROS (+)   Anesthesia Other Findings   Reproductive/Obstetrics negative OB ROS                             Anesthesia Physical Anesthesia Plan  ASA: II  Anesthesia Plan: Spinal and Regional   Post-op Pain Management:    Induction: Intravenous  Airway Management Planned: Simple Face Mask  Additional Equipment:   Intra-op Plan:   Post-operative Plan:   Informed Consent: I have reviewed the patients History and Physical, chart, labs and discussed the procedure including the risks, benefits and alternatives for the proposed anesthesia with the patient or authorized representative who has indicated his/her understanding and acceptance.   Dental Advisory Given  Plan Discussed with: Anesthesiologist, CRNA and Surgeon  Anesthesia Plan Comments:         Anesthesia Quick Evaluation

## 2015-09-01 NOTE — Discharge Instructions (Signed)

## 2015-09-01 NOTE — Anesthesia Postprocedure Evaluation (Signed)
Anesthesia Post Note  Patient: [name]  Procedure(s) Performed: Procedure(s) (LRB): TOTAL KNEE ARTHROPLASTY (Right)  Patient location during evaluation: PACU Anesthesia Type: Spinal and Regional Level of consciousness: oriented and awake and alert Pain management: pain level controlled Vital Signs Assessment: post-procedure vital signs reviewed and stable Respiratory status: spontaneous breathing, respiratory function stable and patient connected to nasal cannula oxygen Cardiovascular status: blood pressure returned to baseline and stable Postop Assessment: no headache and no backache Anesthetic complications: no    Last Vitals:  Vitals:   09/01/15 1624 09/01/15 1625  BP: (!) 181/102 (!) 179/110  Pulse: 77   Resp:    Temp: 36.7 C     Last Pain:  Vitals:   09/01/15 1624  TempSrc: Tympanic  PainSc:                  Zenaida Deed

## 2015-09-01 NOTE — Anesthesia Procedure Notes (Signed)
Anesthesia Regional Block:  Adductor canal block  Pre-Anesthetic Checklist: ,, timeout performed, Correct Patient, Correct Site, Correct Laterality, Correct Procedure, Correct Position, site marked, Risks and benefits discussed,  Surgical consent,  Pre-op evaluation,  At surgeon's request and post-op pain management  Laterality: Right  Prep: chloraprep       Needles:  Injection technique: Single-shot  Needle Type: Stimiplex     Needle Length: 5cm 5 cm Needle Gauge: 21 G    Additional Needles:  Procedures: ultrasound guided (picture in chart) Adductor canal block Narrative:  Injection made incrementally with aspirations every 5 mL.  Performed by: Personally  Anesthesiologist: JUDD, BENJAMIN  Additional Notes: Risks, benefits and alternative to block explained extensively.  Patient tolerated procedure well, without complications.

## 2015-09-01 NOTE — Op Note (Signed)
PATIENT ID:      [name]  MRN:     [id number] DOB/AGE:    63-Aug-1952 / 63 y.o.       OPERATIVE REPORT    DATE OF PROCEDURE:  09/01/2015       PREOPERATIVE DIAGNOSIS:   RIGHT KNEE DEGENERATIVE JOINT DISEASE      Estimated body mass index is 23.54 kg/m as calculated from the following:   Height as of this encounter: 5' 3.5" (1.613 m).   Weight as of this encounter: 135 lb (61.2 kg).                                                        POSTOPERATIVE DIAGNOSIS:   RIGHT KNEE DEGENERATIVE JOINT DISEASE                                                                      PROCEDURE:  Procedure(s): TOTAL KNEE ARTHROPLASTY Using DepuyAttune RP implants #4R Femur, #5Tibia, 5 mm Attune RP bearing, 38 Patella     SURGEON: ROWAN,FRANK J    ASSISTANT:   [employer] K[employer]   (Present and scrubbed throughout the case, critical for assistance with exposure, retraction, instrumentation, and closure.)         ANESTHESIA: spinal, 20cc Exparel, 50cc 0.25% Marcaine  EBL: 300  FLUID REPLACEMENT: 1500 crystalloid  TOURNIQUET TIME: 43min  Drains: None  Tranexamic Acid: 1gm iv, 2gm topical   COMPLICATIONS:  None         INDICATIONS FOR PROCEDURE: The patient has  RIGHT KNEE DEGENERATIVE JOINT DISEASE, Val deformities, XR shows bone on bone arthritis, lateral subluxation of tibia. Patient has failed all conservative measures including anti-inflammatory medicines, narcotics, attempts at  exercise and weight loss, cortisone injections and viscosupplementation.  Risks and benefits of surgery have been discussed, questions answered.   DESCRIPTION OF PROCEDURE: The patient identified by armband, received  IV antibiotics, in the holding area at [facility]. Patient taken to the operating room, appropriate anesthetic  monitors were attached, and spinal anesthesia was  induced. Tourniquet  applied high to the operative thigh. Lateral post and foot positioner  applied to the table, the lower extremity  was then prepped and draped  in usual sterile fashion from the toes to the tourniquet. Time-out procedure was performed. We began the operation, with the knee flexed 120 degrees, by making the anterior midline incision starting at handbreadth above the patella going over the patella 1 cm medial to and 4 cm distal to the tibial tubercle. Small bleeders in the skin and the  subcutaneous tissue identified and cauterized. Transverse retinaculum was incised and reflected medially and a medial parapatellar arthrotomy was accomplished. the patella was everted and theprepatellar fat pad resected. The superficial medial collateral  ligament was then elevated from anterior to posterior along the proximal  flare of the tibia and anterior half of the menisci resected. The knee was hyperflexed exposing bone on bone arthritis. Peripheral and notch osteophytes as well as the cruciate ligaments were then resected. We continued to  work our way  around posteriorly along the proximal tibia, and externally  rotated the tibia subluxing it out from underneath the femur. A McHale  retractor was placed through the notch and a lateral Hohmann retractor  placed, and we then drilled through the proximal tibia in line with the  axis of the tibia followed by an intramedullary guide rod and 2-degree  posterior slope cutting guide. The tibial cutting guide, 3 degree posterior sloped, was pinned into place allowing resection of 7 mm of bone medially and 0 mm of bone laterally. Satisfied with the tibial resection, we then  entered the distal femur 2 mm anterior to the PCL origin with the  intramedullary guide rod and applied the distal femoral cutting guide  set at 9 mm, with 5 degrees of valgus. This was pinned along the  epicondylar axis. At this point, the distal femoral cut was accomplished without difficulty. We then sized for a #4R femoral component and pinned the guide in 0 degrees of external rotation. The chamfer cutting  guide was pinned into place. The anterior, posterior, and chamfer cuts were accomplished without difficulty followed by  the Attune RP box cutting guide and the box cut. We also removed posterior osteophytes from the posterior femoral condyles. At this  time, the knee was brought into full extension. We checked our  extension and flexion gaps and found them symmetric for a 5 mm bearing. Distracting in extension with a lamina spreader, the posterior horns of the menisci were removed, and Exparel, diluted to 60 cc, with 20cc NS, and 20cc 0.5% Marcaine,was injected into the capsule and synovium of the knee. The posterior patella cut was accomplished with the 9.5 mm Attune cutting guide, sized for a 57mm dome, and the fixation pegs drilled.The knee  was then once again hyperflexed exposing the proximal tibia. We sized for a # 5 tibial base plate, applied the smokestack and the conical reamer followed by the the Delta fin keel punch. We then hammered into place the Attune RP trial femoral component, drilled the lugs, inserted a  5 mm trial bearing, trial patellar button, and took the knee through range of motion from 0-130 degrees. No thumb pressure was required for patellar Tracking. At this point, the limb was wrapped with an Esmarch bandage and the tourniquet inflated to 350 mmHg. All trial components were removed, mating surfaces irrigated with pulse lavage, and dried with suction and sponges. A double batch of DePuy HV cement with 1500 mg of Zinacef was mixed and applied to all bony metallic mating surfaces except for the posterior condyles of the femur itself. In order, we  hammered into place the tibial tray and removed excess cement, the femoral component and removed excess cement. The final Attune RP bearing  was inserted, and the knee brought to full extension with compression.  The patellar button was clamped into place, and excess cement  removed. While the cement cured the wound was irrigated out  with normal saline solution pulse lavage. Ligament stability and patellar tracking were checked and found to be excellent. The parapatellar arthrotomy was closed with  running #1 Vicryl suture. The subcutaneous tissue with 0 and 2-0 undyed  Vicryl suture, and the skin with running 3-0 SQ vicryl. A dressing of Xeroform,  4 x 4, dressing sponges, Webril, and Ace wrap applied. The patient  awakened, and taken to recovery room without difficulty.   ROWAN,FRANK J 09/01/2015, 1:53 PM

## 2015-09-02 ENCOUNTER — Encounter (HOSPITAL_COMMUNITY): Payer: Self-pay | Admitting: General Practice

## 2015-09-02 LAB — CBC
HCT: 28.3 % — ABNORMAL LOW (ref 36.0–46.0)
Hemoglobin: 9.5 g/dL — ABNORMAL LOW (ref 12.0–15.0)
MCH: 31.6 pg (ref 26.0–34.0)
MCHC: 33.6 g/dL (ref 30.0–36.0)
MCV: 94 fL (ref 78.0–100.0)
Platelets: 280 10*3/uL (ref 150–400)
RBC: 3.01 MIL/uL — ABNORMAL LOW (ref 3.87–5.11)
RDW: 12.9 % (ref 11.5–15.5)
WBC: 7 10*3/uL (ref 4.0–10.5)

## 2015-09-02 LAB — BASIC METABOLIC PANEL
Anion gap: 5 (ref 5–15)
BUN: 6 mg/dL (ref 6–20)
CO2: 28 mmol/L (ref 22–32)
Calcium: 8.8 mg/dL — ABNORMAL LOW (ref 8.9–10.3)
Chloride: 98 mmol/L — ABNORMAL LOW (ref 101–111)
Creatinine, Ser: 0.7 mg/dL (ref 0.44–1.00)
GFR calc Af Amer: >60 mL/min (ref >60)
GFR calc non Af Amer: >60 mL/min (ref >60)
Glucose, Bld: 210 mg/dL — ABNORMAL HIGH (ref 65–99)
Potassium: 4 mmol/L (ref 3.5–5.1)
Sodium: 131 mmol/L — ABNORMAL LOW (ref 135–145)

## 2015-09-02 NOTE — Progress Notes (Signed)
PATIENT ID: [name]  MRN: [id number]  DOB/AGE:  1950/02/28 / 65 y.o.  1 Day Post-Op Procedure(s) (LRB): TOTAL KNEE ARTHROPLASTY (Right)    PROGRESS NOTE Subjective: Patient is alert, oriented, no Nausea, no Vomiting, yes passing gas. Taking PO well. Denies SOB, Chest or Calf Pain. Using Incentive Spirometer, PAS in place. Ambulate WBAT, CPM 0-40 Patient reports pain as 4/10 .    Objective: Vital signs in last 24 hours: Vitals:   09/01/15 1625 09/01/15 2009 09/01/15 2300 09/02/15 0522  BP: (!) 179/110 (!) 170/103 (!) 156/79 127/74  Pulse:  85  90  Resp:      Temp:  98.4 F (36.9 C)  98.3 F (36.8 C)  TempSrc:  Oral  Oral  SpO2:  97%  94%  Weight:      Height:          Intake/Output from previous day: I/O last 3 completed shifts: In: 2850 [P.O.:1600; I.V.:1250] Out: 150 [Urine:50; Blood:100]   Intake/Output this shift: No intake/output data recorded.   LABORATORY DATA:  Recent Labs  09/02/15 0510  WBC 7.0  HGB 9.5*  HCT 28.3*  PLT 280  NA 131*  K 4.0  CL 98*  CO2 28  BUN 6  CREATININE 0.70  GLUCOSE 210*  CALCIUM 8.8*    Examination: Neurologically intact ABD soft Neurovascular intact Sensation intact distally Intact pulses distally Dorsiflexion/Plantar flexion intact Incision: dressing C/D/I No cellulitis present Compartment soft}  Assessment:   1 Day Post-Op Procedure(s) (LRB): TOTAL KNEE ARTHROPLASTY (Right) ADDITIONAL DIAGNOSIS: Expected Acute Blood Loss Anemia,   Plan: PT/OT WBAT, CPM 5/hrs day until ROM 0-90 degrees, then D/C CPM DVT Prophylaxis:  SCDx72hrs, ASA 325 mg BID x 2 weeks DISCHARGE PLAN: Skilled Nursing Facility/Rehab DISCHARGE NEEDS: HHPT, CPM, Walker and 3-in-1 comode seat     ROWAN,FRANK J 09/02/2015, 7:38 AM

## 2015-09-02 NOTE — Progress Notes (Signed)
Physical Therapy Treatment Patient Details Name: [name] MRN: [id number] DOB: May 29, 1950 Today's Date: 09/02/2015    History of Present Illness Patient is a 65 y/o female with hx of depression, anxiety, seasonal allergies presents s/p Rt TKA.     PT Comments    Patient feeling better this PM however continues to report pain which results in nausea. Tolerated gait training with min A for balance/safety and cues for step through gait. Right knee instability noted but no buckling. Will continue to follow.  Follow Up Recommendations  SNF     Equipment Recommendations  Rolling walker with 5" wheels    Recommendations for Other Services OT consult     Precautions / Restrictions Precautions Precautions: Knee Precaution Booklet Issued: No Precaution Comments: Reviewed no pillow under knee and precautions Restrictions Weight Bearing Restrictions: Yes RLE Weight Bearing: Weight bearing as tolerated    Mobility  Bed Mobility Overal bed mobility: Needs Assistance Bed Mobility: Supine to Sit;Sit to Supine     Supine to sit: Modified independent (Device/Increase time);HOB elevated Sit to supine: HOB elevated   General bed mobility comments: Sitting in chair upon PT arrival.   Transfers Overall transfer level: Needs assistance Equipment used: Rolling walker (2 wheeled) Transfers: Sit to/from Stand Sit to Stand: Min guard         General transfer comment: Min guard to steady in standing. Cues for hand placement/technique. Cues to place equal weight through BLEs upon sit/stand.  Ambulation/Gait Ambulation/Gait assistance: Min guard Ambulation Distance (Feet): 25 Feet (+ 20') Assistive device: Rolling walker (2 wheeled) Gait Pattern/deviations: Step-to pattern;Decreased stance time - right;Decreased step length - left;Trunk flexed   Gait velocity interpretation: Below normal speed for age/gender General Gait Details: Very slow, unsteady gait wtih right knee instability.  Cues for knee extension during stance phase. 1 seated rest break.   Stairs            Wheelchair Mobility    Modified Rankin (Stroke Patients Only)       Balance Overall balance assessment: Needs assistance Sitting-balance support: Feet supported;No upper extremity supported Sitting balance-Leahy Scale: Good     Standing balance support: During functional activity Standing balance-Leahy Scale: Fair Standing balance comment: Able to stand without UE support for therapist to adjust RW.                     Cognition Arousal/Alertness: Awake/alert Behavior During Therapy: WFL for tasks assessed/performed Overall Cognitive Status: Within Functional Limits for tasks assessed       Memory: Decreased short-term memory              Exercises     General Comments        Pertinent Vitals/Pain Pain Assessment: Faces Faces Pain Scale: Hurts whole lot Pain Location: right knee Pain Descriptors / Indicators: Sore;Operative site guarding;Guarding;Grimacing Pain Intervention(s): Monitored during session;Repositioned;Limited activity within patient's tolerance    Home Living Family/patient expects to be discharged to:: Skilled nursing facility [facility] place) Living Arrangements: Alone Available Help at Discharge: Friend(s);Available PRN/intermittently Type of Home: House Home Access: Stairs to enter Entrance Stairs-Rails: Right Home Layout: Two level;Bed/bath upstairs Home Equipment: Walker - standard      Prior Function Level of Independence: Independent          PT Goals (current goals can now be found in the care plan section) Acute Rehab PT Goals Patient Stated Goal: to go to rehab before going home PT Goal Formulation: With patient Time For Goal Achievement:  09/16/15 Potential to Achieve Goals: Good Progress towards PT goals: Progressing toward goals    Frequency  7X/week    PT Plan Current plan remains appropriate    Co-evaluation              End of Session Equipment Utilized During Treatment: Gait belt Activity Tolerance: Patient tolerated treatment well Patient left: in chair;with call bell/phone within reach;Other (comment) (OT present in room.)     Time: [id number] PT Time Calculation (min) (ACUTE ONLY): 24 min  Charges:  $Gait Training: 23-37 mins                    G Codes:      [name] 09/02/2015, 2:29 PM  [name], [place], DPT [phone]

## 2015-09-02 NOTE — NC FL2 (Signed)
[place] LEVEL OF CARE SCREENING TOOL     IDENTIFICATION  Patient Name: [name] Birthdate: 1950/11/21 Sex: female Admission Date (Current Location): 09/01/2015  [facility] and [place] Number:  Herbalist and Address:  The . [facility], [place] [address] [place], [place] [address]      Provider Number: [id number]  Attending Physician Name and Address:  Frederik Pear, MD  Relative Name and Phone Number:       Current Level of Care: Hospital Recommended Level of Care: [place] Prior Approval Number:    Date Approved/Denied:   PASRR Number: [id number]  Discharge Plan: SNF    Current Diagnoses: Patient Active Problem List   Diagnosis Date Noted  . Primary osteoarthritis of right knee 08/28/2015    Orientation RESPIRATION BLADDER Height & Weight     Self, Time, Situation, Place  Normal Continent Weight: 135 lb (61.2 kg) Height:  5' 3.5" (161.3 cm)  BEHAVIORAL SYMPTOMS/MOOD NEUROLOGICAL BOWEL NUTRITION STATUS      Continent Diet  AMBULATORY STATUS COMMUNICATION OF NEEDS Skin   Limited Assist Verbally Surgical wounds                       Personal Care Assistance Level of Assistance  Bathing, Dressing Bathing Assistance: Limited assistance   Dressing Assistance: Limited assistance     Functional Limitations Info             SPECIAL CARE FACTORS FREQUENCY  PT (By licensed PT), OT (By licensed OT)     PT Frequency: daily OT Frequency: daily            Contractures Contractures Info: Not present    Additional Factors Info  Allergies, Code Status Code Status Info: FULL Allergies Info: Codeine           Current Medications (09/02/2015):  This is the current hospital active medication list Current Facility-Administered Medications  Medication Dose Route Frequency Provider Last Rate Last Dose  . acetaminophen (TYLENOL) tablet 650 mg  650 mg Oral Q6H PRN Leighton Parody, PA-C       Or  .  acetaminophen (TYLENOL) suppository 650 mg  650 mg Rectal Q6H PRN Leighton Parody, PA-C      . ALPRAZolam Duanne Moron) tablet 0.5 mg  0.5 mg Oral BID PRN Leighton Parody, PA-C   0.5 mg at 09/01/15 2022  . alum & mag hydroxide-simeth (MAALOX/MYLANTA) 200-200-20 MG/5ML suspension 30 mL  30 mL Oral Q4H PRN Leighton Parody, PA-C      . aspirin EC tablet 325 mg  325 mg Oral Q breakfast Leighton Parody, PA-C   325 mg at 09/02/15 0844  . azelastine (ASTELIN) 0.1 % nasal spray 1 spray  1 spray Each Nare BID Leighton Parody, PA-C   1 spray at 09/02/15 0900  . bisacodyl (DULCOLAX) EC tablet 5 mg  5 mg Oral Daily PRN Leighton Parody, PA-C      . buPROPion (WELLBUTRIN XL) 24 hr tablet 450 mg  450 mg Oral Daily Leighton Parody, PA-C   450 mg at 09/02/15 0900  . cetirizine (ZYRTEC) tablet 10 mg  10 mg Oral Daily Frederik Pear, MD   10 mg at 09/02/15 0900  . desvenlafaxine (PRISTIQ) 24 hr tablet 50 mg  50 mg Oral Daily Leighton Parody, PA-C   50 mg at 09/02/15 0900  . dextrose 5 % and 0.45 % NaCl with KCl 20 mEq/L  infusion   Intravenous Continuous Leighton Parody, PA-C 125 mL/hr at 09/02/15 0215 125 mL/hr at 09/02/15 0215  . diphenhydrAMINE (BENADRYL) 12.5 MG/5ML elixir 12.5-25 mg  12.5-25 mg Oral Q4H PRN Leighton Parody, PA-C      . docusate sodium (COLACE) capsule 100 mg  100 mg Oral BID Leighton Parody, PA-C   100 mg at 09/02/15 0900  . HYDROmorphone (DILAUDID) injection 1 mg  1 mg Intravenous Q2H PRN Leighton Parody, PA-C   1 mg at 09/02/15 1047  . menthol-cetylpyridinium (CEPACOL) lozenge 3 mg  1 lozenge Oral PRN Leighton Parody, PA-C       Or  . phenol (CHLORASEPTIC) mouth spray 1 spray  1 spray Mouth/Throat PRN Leighton Parody, PA-C      . methocarbamol (ROBAXIN) tablet 500 mg  500 mg Oral Q6H PRN Leighton Parody, PA-C   500 mg at 09/02/15 0214   Or  . methocarbamol (ROBAXIN) 500 mg in dextrose 5 % 50 mL IVPB  500 mg Intravenous Q6H PRN Leighton Parody, PA-C      . metoCLOPramide (REGLAN) tablet 5-10 mg  5-10 mg  Oral Q8H PRN Leighton Parody, PA-C       Or  . metoCLOPramide (REGLAN) injection 5-10 mg  5-10 mg Intravenous Q8H PRN Leighton Parody, PA-C      . ondansetron [facility]) tablet 4 mg  4 mg Oral Q6H PRN Leighton Parody, PA-C       Or  . ondansetron [facility]) injection 4 mg  4 mg Intravenous Q6H PRN Leighton Parody, PA-C   4 mg at 09/02/15 0729  . oxyCODONE (Oxy IR/ROXICODONE) immediate release tablet 5-10 mg  5-10 mg Oral Q3H PRN Leighton Parody, PA-C   10 mg at 09/02/15 [id number]  . pantoprazole (PROTONIX) EC tablet 20 mg  20 mg Oral Daily Leighton Parody, PA-C   20 mg at 09/02/15 0900  . senna-docusate (Senokot-S) tablet 1 tablet  1 tablet Oral QHS PRN Leighton Parody, PA-C      . sodium phosphate (FLEET) 7-19 GM/118ML enema 1 enema  1 enema Rectal Once PRN Leighton Parody, PA-C      . zolpidem [facility]) tablet 5 mg  5 mg Oral QHS PRN Frederik Pear, MD   5 mg at 09/01/15 2206     Discharge Medications: Please see discharge summary for a list of discharge medications.  Relevant Imaging Results:  Relevant Lab Results:   Additional Information SSN: [id number]  Dulcy Fanny, LCSW

## 2015-09-02 NOTE — Evaluation (Signed)
Physical Therapy Evaluation Patient Details Name: [name] MRN: [id number] DOB: [birth year] Today's Date: 09/02/2015   History of Present Illness  Patient is a 65 y/o female with hx of depression, anxiety, seasonal allergies presents s/p Rt TKA.   Clinical Impression  Patient presents with lethargy, nausea, dizziness, pain and post surgical deficits RLE s/p Rt TKA. Mobility assessment limited due to nausea and dizziness. Instructed pt on exercises during session. Tolerated standing with Min guard assist but LOB onto bed. Pt lives alone in 2 story home and not safe to return home there without support. Would benefit from short term SNF to maximize independence and mobility prior to return home. Will follow.    Follow Up Recommendations SNF    Equipment Recommendations  Rolling walker with 5" wheels    Recommendations for Other Services OT consult     Precautions / Restrictions Precautions Precautions: Knee Precaution Booklet Issued: No Precaution Comments: Reviewed no pillow under knee and precautions Restrictions Weight Bearing Restrictions: Yes RLE Weight Bearing: Weight bearing as tolerated      Mobility  Bed Mobility Overal bed mobility: Needs Assistance Bed Mobility: Supine to Sit;Sit to Supine     Supine to sit: Modified independent (Device/Increase time);HOB elevated Sit to supine: HOB elevated   General bed mobility comments: Increased time and use of rail. No assist needed.   Transfers Overall transfer level: Needs assistance Equipment used: Rolling walker (2 wheeled) Transfers: Sit to/from Stand Sit to Stand: Min guard         General transfer comment: Min guard to steady in standing and assist with donning underwear. LOB onto bed. + dizziness.  Ambulation/Gait Ambulation/Gait assistance:  (Deferred secondary to nausea, dizziness.)              Stairs            Wheelchair Mobility    Modified Rankin (Stroke Patients Only)        Balance Overall balance assessment: Needs assistance Sitting-balance support: Feet supported;No upper extremity supported Sitting balance-Leahy Scale: Good     Standing balance support: During functional activity Standing balance-Leahy Scale: Poor Standing balance comment: Reilant on BUEs for support. Min gaurd to don underwear but LOB onto bed due to dizziness.                             Pertinent Vitals/Pain Pain Assessment: Faces Faces Pain Scale: Hurts a little bit Pain Location: right knee Pain Descriptors / Indicators: Sore;Operative site guarding Pain Intervention(s): Monitored during session;Premedicated before session;Repositioned;Limited activity within patient's tolerance    Home Living Family/patient expects to be discharged to:: Skilled nursing facility [facility] place) Living Arrangements: Alone Available Help at Discharge: Friend(s);Available PRN/intermittently Type of Home: House Home Access: Stairs to enter Entrance Stairs-Rails: Right Entrance Stairs-Number of Steps: 4 Home Layout: Two level;Bed/bath upstairs Home Equipment: Walker - standard      Prior Function Level of Independence: Independent               Hand Dominance        Extremity/Trunk Assessment   Upper Extremity Assessment: Defer to OT evaluation           Lower Extremity Assessment: RLE deficits/detail RLE Deficits / Details: Limited AROM/strength secondary to pain/surgery       Communication   Communication: No difficulties  Cognition Arousal/Alertness: Lethargic;Suspect due to medications Behavior During Therapy: [facility] for tasks assessed/performed Overall Cognitive Status: Within Functional Limits for  tasks assessed                      General Comments      Exercises Total Joint Exercises Ankle Circles/Pumps: Both;10 reps;Supine Quad Sets: Both;10 reps;Supine Heel Slides: Right;10 reps;Supine Hip ABduction/ADduction: Right;10  reps;Supine Long Arc Quad: Right;5 reps;Seated Goniometric ROM: 10-75 degrees knee AROM      Assessment/Plan    PT Assessment Patient needs continued PT services  PT Diagnosis Difficulty walking;Acute pain   PT Problem List Decreased strength;Decreased mobility;Decreased range of motion;Decreased balance;Decreased activity tolerance;Impaired sensation;Pain  PT Treatment Interventions Therapeutic activities;Gait training;Therapeutic exercise;Patient/family education;Stair training;Balance training;Functional mobility training   PT Goals (Current goals can be found in the Care Plan section) Acute Rehab PT Goals Patient Stated Goal: to go to rehab before going home PT Goal Formulation: With patient Time For Goal Achievement: 09/16/15 Potential to Achieve Goals: Good    Frequency 7X/week   Barriers to discharge Decreased caregiver support;Inaccessible home environment lives alone and has stairs to get into home/bedroom    Co-evaluation               End of Session Equipment Utilized During Treatment: Gait belt Activity Tolerance: Treatment limited secondary to medical complications (Comment) (nausea, dizziness) Patient left: in bed;with call bell/phone within reach Nurse Communication: Mobility status         Time: [id number] PT Time Calculation (min) (ACUTE ONLY): 37 min   Charges:   PT Evaluation $PT Eval Moderate Complexity: 1 Procedure PT Treatments $Therapeutic Exercise: 8-22 mins   PT G Codes:        Shauna A Hartshorne 09/02/2015, 12:23 PM  Wray Kearns, [place], DPT [phone]

## 2015-09-02 NOTE — Clinical Social Work Note (Signed)
Clinical Social Work Assessment  Patient Details  Name: [name] MRN: [id number] Date of Birth: 02/04/1950  Date of referral:  09/02/15               Reason for consult:  Facility Placement                Permission sought to share information with:  Facility Sport and exercise psychologist, Case Optician, dispensing granted to share information::  Yes, Verbal Permission Granted  Name::        Agency::   ([facility]- preregistered)  Relationship::     Contact Information:     Housing/Transportation Living arrangements for the past 2 months:  [place] of Information:  Patient Patient Interpreter Needed:  None Criminal Activity/Legal Involvement Pertinent to Current Situation/Hospitalization:  No - Comment as needed Significant Relationships:  Siblings Lives with:  Self Do you feel safe going back to the place where you live?  No Need for family participation in patient care:  No (Coment)  Care giving concerns:  No caregivers present at time of this assessment   Social Worker assessment / plan:  CSW discussed disposition with patient.  Patient states she has pre-registered with [facility] SN[facility] prior to admission.  Patient states she is from home alone with limited to minimal supports at time of discharge.  Patient has completed admissions paperwork with [facility] and is requesting PTAR transportation.   Employment status:  Retired Forensic scientist:   Nurse, mental health) PT Recommendations:  [place] / Referral to community resources:  [place]  Patient/Family's Response to care:  Patient is agreeable and has plans to dc to SNF when medically stable.  Patient/Family's Understanding of and Emotional Response to Diagnosis, Current Treatment, and Prognosis:  Patient is very realistic regarding her plans to dc to SNF for STR at time of discharge.  Patient feels that the MD office has done a terrific job preparing her for what is to come  and patient feels at peace with the process.  Emotional Assessment Appearance:  Appears stated age Attitude/Demeanor/Rapport:    Affect (typically observed):  Accepting, Adaptable Orientation:  Oriented to Self, Oriented to Place, Oriented to  Time, Oriented to Situation Alcohol / Substance use:  Not Applicable Psych involvement (Current and /or in the community):  No (Comment)  Discharge Needs  Concerns to be addressed:  No discharge needs identified Readmission within the last 30 days:  No Current discharge risk:  None Barriers to Discharge:  No Barriers Identified   Dulcy Fanny, LCSW 09/02/2015, 1:50 PM

## 2015-09-02 NOTE — Clinical Social Work Placement (Signed)
   CLINICAL SOCIAL WORK PLACEMENT  NOTE  Date:  09/02/2015  Patient Details  Name: [name] MRN: [id number] Date of Birth: July 15, 1950  Clinical Social Work is seeking post-discharge placement for this patient at the [place] level of care (*CSW will initial, date and re-position this form in  chart as items are completed):  Yes   Patient/family provided with [facility] Work Department's list of facilities offering this level of care within the geographic area requested by the patient (or if unable, by the patient's family).  Yes   Patient/family informed of their freedom to choose among providers that offer the needed level of care, that participate in Medicare, Medicaid or managed care program needed by the patient, have an available bed and are willing to accept the patient.  Yes   Patient/family informed of [facility]'s ownership interest in [facility] and [facility], as well as of the fact that they are under no obligation to receive care at these facilities.  PASRR submitted to EDS on 09/02/15     PASRR number received on 09/02/15     Existing PASRR number confirmed on       FL2 transmitted to all facilities in geographic area requested by pt/family on 09/02/15     FL2 transmitted to all facilities within larger geographic area on       Patient informed that his/her managed care company has contracts with or will negotiate with certain facilities, including the following:        Yes   Patient/family informed of bed offers received.  Patient chooses bed at       Physician recommends and patient chooses bed at      Patient to be transferred to   on  .  Patient to be transferred to facility by       Patient family notified on   of transfer.  Name of family member notified:        PHYSICIAN       Additional Comment:    _______________________________________________ Dulcy Fanny, LCSW 09/02/2015, 1:52 PM

## 2015-09-02 NOTE — Evaluation (Signed)
Occupational Therapy Evaluation Patient Details Name: [name] MRN: [id number] DOB: Apr 01, 1950 Today's Date: 09/02/2015    History of Present Illness Patient is a 65 y/o female with hx of depression, anxiety, seasonal allergies presents s/p Rt TKA.    Clinical Impression   Pt was admitted for the above sx.  At baseline, she is independent with adls.  Pt currently needs up to mod A for LB and min guard for transfers. She needs reinforcement for safety.  Pt lives alone and will benefit from continued OT in acute setting as well as SNF.    Follow Up Recommendations  SNF    Equipment Recommendations  3 in 1 bedside comode    Recommendations for Other Services       Precautions / Restrictions Precautions Precautions: Knee Precaution Booklet Issued: No Precaution Comments: Reviewed no pillow under knee and precautions Restrictions Weight Bearing Restrictions: Yes RLE Weight Bearing: Weight bearing as tolerated      Mobility Bed Mobility Overal bed mobility: Needs Assistance Bed Mobility: Supine to Sit;Sit to Supine      Sit to supine: Min assist   General bed mobility comments: assist for RLE  Transfers Overall transfer level: Needs assistance Equipment used: Rolling walker (2 wheeled) Transfers: Sit to/from Stand Sit to Stand: Min guard         General transfer comment: for safety during transition.  Cues for UE/LE placement and safety    Balance Overall balance assessment: Needs assistance Sitting-balance support: Feet supported;No upper extremity supported Sitting balance-Leahy Scale: Good     Standing balance support: During functional activity Standing balance-Leahy Scale: Fair                             ADL Overall ADL's : Needs assistance/impaired     Grooming: Wash/dry hands;Supervision/safety;Standing   Upper Body Bathing: Set up;Sitting   Lower Body Bathing: Minimal assistance;Sit to/from stand   Upper Body Dressing : Set  up;Sitting   Lower Body Dressing: Moderate assistance;Sit to/from stand   Toilet Transfer: Ambulation;BSC;RW;Min guard   Toileting- Water quality scientist and Hygiene: Sit to/from stand;Min guard         General ADL Comments: ambulated to bathroom with min guard. Cues for safety:  hand placement, turning walker completely with her, turning her body completely and making sure LLE touches surface prior to sitting.   Pt saw AE at preop class--did not use this session     Vision     Perception     Praxis      Pertinent Vitals/Pain Pain Assessment: 0-10 Pain Score: 7  Faces Pain Scale: Hurts whole lot Pain Location: R knee Pain Descriptors / Indicators: Sore Pain Intervention(s): Limited activity within patient's tolerance;Monitored during session;Repositioned;Ice applied (requesting nausea meds)     Hand Dominance     Extremity/Trunk Assessment Upper Extremity Assessment Upper Extremity Assessment: Overall WFL for tasks assessed          Communication Communication Communication: No difficulties   Cognition Arousal/Alertness: Awake/alert Behavior During Therapy: WFL for tasks assessed/performed Overall Cognitive Status: Within Functional Limits for tasks assessed--needs reinforcement for safety                    General Comments       Exercises       Shoulder Instructions      Home Living Family/patient expects to be discharged to:: Skilled nursing facility ([facility]) Living Arrangements: Alone ([place])  Available Help at Discharge: Friend(s);Available PRN/intermittently Type of Home: House Home Access: Stairs to enter [employer] of Steps: 4 Entrance Stairs-Rails: Right Home Layout: Two level;Bed/bath upstairs Alternate Level Stairs-Number of Steps: 16 steps Alternate Level Stairs-Rails: Right           Home Equipment: Walker - standard          Prior Functioning/Environment Level of Independence: Independent              OT Diagnosis: Acute pain   OT Problem List: Decreased activity tolerance;Decreased knowledge of use of DME or AE;Pain   OT Treatment/Interventions: Self-care/ADL training;DME and/or AE instruction;Patient/family education    OT Goals(Current goals can be found in the care plan section) Acute Rehab OT Goals Patient Stated Goal: to go to rehab before going home OT Goal Formulation: With patient Time For Goal Achievement: 09/09/15 Potential to Achieve Goals: Good ADL Goals Pt Will Transfer to Toilet: with supervision;ambulating;bedside commode Additional ADL Goal #1: pt will complete adl with set up/supervision using AE, sit to stand Additional ADL Goal #2: Pt will not need any more than one vc for safety during adls/bathroom transfers  OT Frequency: Min 2X/week   Barriers to D/C:            Co-evaluation              End of Session CPM Right Knee CPM Right Knee: Off Nurse Communication:  (message to unit secretary: nausea meds needed)  Activity Tolerance: Patient limited by pain (and nausea) Patient left: in bed;with call bell/phone within reach;with bed alarm set   Time: 1345-1407 OT Time Calculation (min): 22 min Charges:  OT General Charges $OT Visit: 1 Procedure OT Evaluation $OT Eval Low Complexity: 1 Procedure G-Codes:    Davies,Destiny 2015-09-20, 2:53 PM Lesle Chris, OTR/L [phone] 2015/09/20

## 2015-09-02 NOTE — Progress Notes (Signed)
Pt c/o frequent urination and voiding small amounts  Did post void residual bladder scan 85 mls. I informed the pt that according to her health history this is not new for her. I asked if burning or pain associated with urination she states that just a little bit of burning no pain. Will continue to monitor. Arthor Captain LPN

## 2015-09-03 LAB — CBC
HCT: 30.5 % — ABNORMAL LOW (ref 36.0–46.0)
Hemoglobin: 10 g/dL — ABNORMAL LOW (ref 12.0–15.0)
MCH: 31.3 pg (ref 26.0–34.0)
MCHC: 32.8 g/dL (ref 30.0–36.0)
MCV: 95.6 fL (ref 78.0–100.0)
Platelets: 264 10*3/uL (ref 150–400)
RBC: 3.19 MIL/uL — ABNORMAL LOW (ref 3.87–5.11)
RDW: 12.8 % (ref 11.5–15.5)
WBC: 9.3 10*3/uL (ref 4.0–10.5)

## 2015-09-03 MED ORDER — HYDROMORPHONE HCL 2 MG PO TABS: 2.0000 mg | ORAL_TABLET | Freq: Four times a day (QID) | ORAL | 0 refills | Status: DC | PRN

## 2015-09-03 NOTE — Clinical Social Work Note (Addendum)
11:59am- PT evaluated patient.  Therapy notes sent to [facility]  11:19am- PT paged to evaluate patient STAT for imminent dc pending insurance authorization.   PT paged.  CSW awaiting a return call.  Patient will discharge today per MD order. Patient will discharge to: [facility] RN to call report prior to transportation to: [phone] Transportation: PTAR to be set up once authorization has been received from [facility]/[employer]  CSW sent discharge summary to SNF for review.     Nonnie Done, LCSW ([id number]) [id number]  Licensed Clinical Social Worker

## 2015-09-03 NOTE — Progress Notes (Signed)
Physical Therapy Treatment Patient Details Name: [name] MRN: [id number] DOB: 1950/01/24 Today's Date: 09/03/2015    History of Present Illness Patient is a 65 y/o female with hx of depression, anxiety, seasonal allergies presents s/p Rt TKA.     PT Comments    Pt was seen for therapy today with progression minimally of gait, painful WBing on R knee and pt is not sure if she is leaving.  Will anticipate transition to SNF and will report same to nursing of her efforts.  Pt asking for meds when available.    Follow Up Recommendations  SNF     Equipment Recommendations  Rolling walker with 5" wheels    Recommendations for Other Services OT consult     Precautions / Restrictions Precautions Precautions: Knee Precaution Booklet Issued: No Precaution Comments: Reviewed no pillow under knee and precautions Restrictions Weight Bearing Restrictions: Yes RLE Weight Bearing: Weight bearing as tolerated    Mobility  Bed Mobility Overal bed mobility: Needs Assistance Bed Mobility: Supine to Sit;Sit to Supine     Supine to sit: Min guard;Min assist Sit to supine: Min guard;Min assist   General bed mobility comments: assist for RLE  Transfers Overall transfer level: Needs assistance Equipment used: Rolling walker (2 wheeled) Transfers: Sit to/from [facility] Sit to Stand: Min guard Stand pivot transfers: Min assist;Min guard       General transfer comment: needed cues for sequence and to set up to sit on bed  Ambulation/Gait Ambulation/Gait assistance: Min guard Ambulation Distance (Feet): 35 Feet Assistive device: Rolling walker (2 wheeled) Gait Pattern/deviations: Step-to pattern;Decreased stride length;Wide base of support;Trunk flexed;Decreased weight shift to right;Decreased dorsiflexion - right Gait velocity: reduced Gait velocity interpretation: Below normal speed for age/gender General Gait Details: Pt is struggling to stand on RLE and reports  pain with any extension   Stairs            Wheelchair Mobility    Modified Rankin (Stroke Patients Only)       Balance Overall balance assessment: Needs assistance Sitting-balance support: Feet supported Sitting balance-Leahy Scale: Good     Standing balance support: Bilateral upper extremity supported Standing balance-Leahy Scale: Fair                      Cognition Arousal/Alertness: Awake/alert Behavior During Therapy: WFL for tasks assessed/performed Overall Cognitive Status: Within Functional Limits for tasks assessed       Memory: Decreased short-term memory              Exercises Total Joint Exercises Ankle Circles/Pumps: Both;10 reps;Supine Quad Sets: Both;10 reps;Supine    General Comments        Pertinent Vitals/Pain Pain Assessment: 0-10 Pain Score: 6  Pain Descriptors / Indicators: Operative site guarding Pain Intervention(s): Limited activity within patient's tolerance;Monitored during session;Premedicated before session;Repositioned;Relaxation;Ice applied    Home Living                      Prior Function            PT Goals (current goals can now be found in the care plan section) Acute Rehab PT Goals Patient Stated Goal: to go to rehab before going home Progress towards PT goals: Progressing toward goals    Frequency  7X/week    PT Plan Current plan remains appropriate    Co-evaluation             End of Session Equipment Utilized During  Treatment: Gait belt Activity Tolerance: Patient tolerated treatment well Patient left: in bed;with call bell/phone within reach;with bed alarm set (pt asked to rest in bed)     Time: [id number] PT Time Calculation (min) (ACUTE ONLY): 38 min  Charges:  $Gait Training: 8-22 mins $Therapeutic Exercise: 8-22 mins $Therapeutic Activity: 8-22 mins                    G Codes:      Ramond Dial 09-20-15, 11:52 AM    Mee Hives, PT MS Acute Rehab Dept.  Number: [place] and [place]

## 2015-09-03 NOTE — Progress Notes (Signed)
PATIENT ID: [name]  MRN: [id number]  DOB/AGE:  Jan 04, 1951 / 65 y.o.  2 Days Post-Op Procedure(s) (LRB): TOTAL KNEE ARTHROPLASTY (Right)    PROGRESS NOTE Subjective: Patient is alert, oriented, no Nausea, no Vomiting, yes passing gas. Taking PO well. Denies SOB, Chest or Calf Pain. Using Incentive Spirometer, PAS in place. Ambulate WBAT with pt walking 25 ft with therapy, CPM 0-40 Patient reports pain as 9/10 .  Pt states the oral oxycodone is not relieving her pain.  Objective: Vital signs in last 24 hours: Vitals:   09/02/15 0522 09/02/15 1500 09/02/15 2116 09/03/15 0431  BP: 127/74 (!) 161/96 137/84 (!) 144/79  Pulse: 90 86 90 91  Resp:  16 18 18   Temp: 98.3 F (36.8 C) 98.2 F (36.8 C) 98.9 F (37.2 C) 98.5 F (36.9 C)  TempSrc: Oral Oral Oral Oral  SpO2: 94% 99% 99% 100%  Weight:      Height:          Intake/Output from previous day: I/O last 3 completed shifts: In: [id number] [P.O.:480; I.V.:2750] Out: -    Intake/Output this shift: No intake/output data recorded.   LABORATORY DATA:  Recent Labs  09/02/15 0510 09/03/15 0628  WBC 7.0 9.3  HGB 9.5* 10.0*  HCT 28.3* 30.5*  PLT 280 264  NA 131*  --   K 4.0  --   CL 98*  --   CO2 28  --   BUN 6  --   CREATININE 0.70  --   GLUCOSE 210*  --   CALCIUM 8.8*  --     Examination: Neurologically intact Neurovascular intact Sensation intact distally Intact pulses distally Dorsiflexion/Plantar flexion intact Incision: scant drainage No cellulitis present Compartment soft}  Assessment:   2 Days Post-Op Procedure(s) (LRB): TOTAL KNEE ARTHROPLASTY (Right) ADDITIONAL DIAGNOSIS: Expected Acute Blood Loss Anemia, anxiety and depression  Plan: PT/OT WBAT, CPM 5/hrs day until ROM 0-90 degrees, then D/C CPM DVT Prophylaxis:  SCDx72hrs, ASA 325 mg BID x 2 weeks DISCHARGE PLAN: Skilled Nursing Facility/Rehab DISCHARGE NEEDS: HHPT, HHRN, CPM, Walker and 3-in-1 comode seat     PHILLIPS, ERIC R 09/03/2015, 7:50  AM

## 2015-09-03 NOTE — Discharge Summary (Signed)
Patient ID: [name] MRN: [id number] DOB/AGE: 1950-03-22 65 y.o.  Admit date: 09/01/2015 Discharge date: 09/03/2015  Admission Diagnoses:  Principal Problem:   Primary osteoarthritis of right knee   Discharge Diagnoses:  Same  Past Medical History:  Diagnosis Date  . Anxiety    takes Xanax daily   . Arthritis   . Depression    takes Pristiq and Wellbutrin daily  . GERD (gastroesophageal reflux disease)   . Joint pain   . Joint swelling   . Nocturia   . Seasonal allergies    Takes Zyrtec nightly along with Nasal Spray  . Vitamin D deficiency    new script for Vit D to start 08/22/15    Surgeries: Procedure(s): TOTAL KNEE ARTHROPLASTY on 09/01/2015   Consultants:   Discharged Condition: Improved  Hospital Course: Shyesha Clippard is an 65 y.o. female who was admitted 09/01/2015 for operative treatment ofPrimary osteoarthritis of right knee. Patient has severe unremitting pain that affects sleep, daily activities, and work/hobbies. After pre-op clearance the patient was taken to the operating room on 09/01/2015 and underwent  Procedure(s): TOTAL KNEE ARTHROPLASTY.    Patient was given perioperative antibiotics: Anti-infectives    Start     Dose/Rate Route Frequency Ordered Stop   09/01/15 1100  ceFAZolin (ANCEF) IVPB 2g/100 mL premix     2 g 200 mL/hr over 30 Minutes Intravenous To [facility] 08/29/15 2144 09/01/15 1220       Patient was given sequential compression devices, early ambulation, and chemoprophylaxis to prevent DVT.  Patient benefited maximally from hospital stay and there were no complications.    Recent vital signs: Patient Vitals for the past 24 hrs:  BP Temp Temp src Pulse Resp SpO2  09/03/15 0431 (!) 144/79 98.5 F (36.9 C) Oral 91 18 100 %  09/02/15 2116 137/84 98.9 F (37.2 C) Oral 90 18 99 %  09/02/15 1500 (!) 161/96 98.2 F (36.8 C) Oral 86 16 99 %     Recent laboratory studies:  Recent Labs  09/02/15 0510 09/03/15 0628  WBC 7.0  9.3  HGB 9.5* 10.0*  HCT 28.3* 30.5*  PLT 280 264  NA 131*  --   K 4.0  --   CL 98*  --   CO2 28  --   BUN 6  --   CREATININE 0.70  --   GLUCOSE 210*  --   CALCIUM 8.8*  --      Discharge Medications:     Medication List    TAKE these medications   ALPRAZolam 0.5 MG tablet Commonly known as:  XANAX Take 0.5 mg by mouth 2 (two) times daily as needed for anxiety.   aspirin EC 325 MG tablet Take 1 tablet (325 mg total) by mouth 2 (two) times daily.   azelastine 0.1 % nasal spray Commonly known as:  ASTELIN Place 1 spray into both nostrils 2 (two) times daily. Use in each nostril as directed   buPROPion 150 MG 24 hr tablet Commonly known as:  WELLBUTRIN XL Take 450 mg by mouth daily.   cetirizine 10 MG tablet Commonly known as:  ZYRTEC Take 10 mg by mouth at bedtime.   desvenlafaxine 50 MG 24 hr tablet Commonly known as:  PRISTIQ Take 50 mg by mouth daily.   HYDROmorphone 2 MG tablet Commonly known as:  DILAUDID Take 1 tablet (2 mg total) by mouth every 6 (six) hours as needed for severe pain.   lansoprazole 15 MG capsule Commonly known as:  PREVACID Take 15 mg by mouth daily at 12 noon.   Melatonin 10 MG Tabs Take 10 mg by mouth at bedtime.   methocarbamol 500 MG tablet Commonly known as:  ROBAXIN Take 1 tablet (500 mg total) by mouth 2 (two) times daily with a meal.   Vitamin D (Ergocalciferol) 50000 units Caps capsule Commonly known as:  DRISDOL Take 50,000 Units by mouth every 7 (seven) days.   zolpidem 10 MG tablet Commonly known as:  AMBIEN Take 10 mg by mouth at bedtime.       Diagnostic Studies: Dg Chest 2 View  Result Date: 08/22/2015 CLINICAL DATA:  Preoperative examination prior to total right knee arthroplasty. EXAM: CHEST  2 VIEW COMPARISON:  None in PACs FINDINGS: The lungs are adequately inflated. There is no focal infiltrate. There is no pleural effusion. The heart and pulmonary vascularity are normal. The mediastinum is normal in  width. The bony thorax is unremarkable. IMPRESSION: There is no active cardiopulmonary disease. Electronically Signed   By: David  Martinique M.D.   On: 08/22/2015 14:04    Disposition: Final discharge disposition not confirmed  Discharge Instructions    CPM    Complete by:  As directed   Continuous passive motion machine (CPM):      Use the CPM from 0 to 60  for 5 hours per day.      You may increase by 10 degrees per day.  You may break it up into 2 or 3 sessions per day.      Use CPM for 2 weeks or until you are told to stop.   Call MD / Call 911    Complete by:  As directed   If you experience chest pain or shortness of breath, CALL 911 and be transported to the hospital emergency room.  If you develope a fever above 101 F, pus (white drainage) or increased drainage or redness at the wound, or calf pain, call your surgeon's office.   Constipation Prevention    Complete by:  As directed   Drink plenty of fluids.  Prune juice may be helpful.  You may use a stool softener, such as Colace (over the counter) 100 mg twice a day.  Use MiraLax (over the counter) for constipation as needed.   Diet - low sodium heart healthy    Complete by:  As directed   Driving restrictions    Complete by:  As directed   No driving for 2 weeks   Increase activity slowly as tolerated    Complete by:  As directed   Patient may shower    Complete by:  As directed   You may shower without a dressing once there is no drainage.  Do not wash over the wound.  If drainage remains, cover wound with plastic wrap and then shower.      Follow-up Information    Kerin Salen, MD In 2 weeks.   Specialty:  Orthopedic Surgery Contact information: [place] [address] [phone]            Signed: Theodosia Quay 09/03/2015, 7:54 AM

## 2015-09-03 NOTE — Clinical Social Work Placement (Signed)
   CLINICAL SOCIAL WORK PLACEMENT  NOTE  Date:  09/03/2015  Patient Details  Name: [name] MRN: [id number] Date of Birth: Jun 29, 1950  Clinical Social Work is seeking post-discharge placement for this patient at the [place] level of care (*CSW will initial, date and re-position this form in  chart as items are completed):  Yes   Patient/family provided with [facility] Work Department's list of facilities offering this level of care within the geographic area requested by the patient (or if unable, by the patient's family).  Yes   Patient/family informed of their freedom to choose among providers that offer the needed level of care, that participate in Medicare, Medicaid or managed care program needed by the patient, have an available bed and are willing to accept the patient.  Yes   Patient/family informed of [facility]'s ownership interest in [facility] and [facility], as well as of the fact that they are under no obligation to receive care at these facilities.  PASRR submitted to EDS on 09/02/15     PASRR number received on 09/02/15     Existing PASRR number confirmed on       FL2 transmitted to all facilities in geographic area requested by pt/family on 09/02/15     FL2 transmitted to all facilities within larger geographic area on       Patient informed that his/her managed care company has contracts with or will negotiate with certain facilities, including the following:        Yes   Patient/family informed of bed offers received.  Patient chooses bed at [facility]     Physician recommends and patient chooses bed at      Patient to be transferred to [facility] on 09/03/15.  Patient to be transferred to facility by [facility]     Patient family notified on 09/03/15 of transfer.  Name of family member notified:  patient is alert and oriented and family is at bedside     PHYSICIAN Please sign FL2, Please prepare prescriptions,  Please prepare priority discharge summary, including medications     Additional Comment:    _______________________________________________ Dulcy Fanny, LCSW 09/03/2015, 11:18 AM

## 2015-09-03 NOTE — Progress Notes (Signed)
[facility] here to transport patient to SNF. Report called to [place] at [facility]. Patient received pain medicine.  Alert and oriented.

## 2015-09-04 ENCOUNTER — Encounter: Payer: Self-pay | Admitting: Adult Health

## 2015-09-04 ENCOUNTER — Non-Acute Institutional Stay (SKILLED_NURSING_FACILITY): Payer: BC Managed Care – PPO | Admitting: Adult Health

## 2015-09-04 DIAGNOSIS — K219 Gastro-esophageal reflux disease without esophagitis: Secondary | ICD-10-CM | POA: Diagnosis not present

## 2015-09-04 DIAGNOSIS — F329 Major depressive disorder, single episode, unspecified: Secondary | ICD-10-CM | POA: Diagnosis not present

## 2015-09-04 DIAGNOSIS — D62 Acute posthemorrhagic anemia: Secondary | ICD-10-CM

## 2015-09-04 DIAGNOSIS — E871 Hypo-osmolality and hyponatremia: Secondary | ICD-10-CM | POA: Diagnosis not present

## 2015-09-04 DIAGNOSIS — J302 Other seasonal allergic rhinitis: Secondary | ICD-10-CM

## 2015-09-04 DIAGNOSIS — E559 Vitamin D deficiency, unspecified: Secondary | ICD-10-CM

## 2015-09-04 DIAGNOSIS — F419 Anxiety disorder, unspecified: Secondary | ICD-10-CM

## 2015-09-04 DIAGNOSIS — G4700 Insomnia, unspecified: Secondary | ICD-10-CM

## 2015-09-04 DIAGNOSIS — K5901 Slow transit constipation: Secondary | ICD-10-CM | POA: Diagnosis not present

## 2015-09-04 DIAGNOSIS — F32A Depression, unspecified: Secondary | ICD-10-CM

## 2015-09-04 DIAGNOSIS — R2681 Unsteadiness on feet: Secondary | ICD-10-CM | POA: Diagnosis not present

## 2015-09-04 DIAGNOSIS — M1711 Unilateral primary osteoarthritis, right knee: Secondary | ICD-10-CM

## 2015-09-04 NOTE — Progress Notes (Signed)
Patient ID: [name], female   DOB: Jun 06, 1950, 65 y.o.   MRN: [id number]    DATE:  09/04/2015   MRN:  [id number]  BIRTHDAY: 11-14-1950  Facility:  [facility]ursing Home Location:  [place] Room Number: 103-P  LEVEL OF CARE:  SNF [phone])  Contact Information    Name Relation Home Work [place] Sister [phone]  [phone]   Driver,Jerry Other [id number]         Code Status History    Date Active Date Inactive Code Status Order ID Comments User Context   09/01/2015  4:31 PM 09/03/2015  9:26 PM Full Code [id number]  Leighton Parody, PA-C Inpatient       Chief Complaint  Patient presents with  . Hospitalization Follow-up    HISTORY OF PRESENT ILLNESS:  This is a 65 year old female who has been admitted to [facility] on 09/03/15 from [facility] with primary osteoarthritis of right knee S/P  Right total knee arthroplasty on 09/01/15.  She has been admitted for a short-term rehabilitation.  She is seen in her room today and verbalized that her pain is well-controlled but is having constipation.   PAST MEDICAL HISTORY:  Past Medical History:  Diagnosis Date  . Anxiety    takes Xanax daily   . Arthritis   . Depression    takes Pristiq and Wellbutrin daily  . GERD (gastroesophageal reflux disease)   . Joint pain   . Joint swelling   . Nocturia   . Seasonal allergies    Takes Zyrtec nightly along with Nasal Spray  . Vitamin D deficiency    new script for Vit D to start 08/22/15     CURRENT MEDICATIONS: Reviewed  Patient's Medications  New Prescriptions   No medications on file  Previous Medications   ALPRAZOLAM (XANAX) 0.5 MG TABLET    Take 0.5 mg by mouth 2 (two) times daily as needed for anxiety.   ASPIRIN EC 325 MG TABLET    Take 1 tablet (325 mg total) by mouth 2 (two) times daily.   AZELASTINE (ASTELIN) 0.1 % NASAL SPRAY    Place 1 spray into both nostrils 2 (two) times daily. Use in each nostril as directed   BUPROPION (WELLBUTRIN XL) 150 MG 24 HR TABLET    Take 450 mg by mouth daily.    CETIRIZINE (ZYRTEC) 10 MG TABLET    Take 10 mg by mouth at bedtime.    DESVENLAFAXINE (PRISTIQ) 50 MG 24 HR TABLET    Take 50 mg by mouth daily.    HYDROMORPHONE (DILAUDID) 2 MG TABLET    Take 1 tablet (2 mg total) by mouth every 6 (six) hours as needed for severe pain.   LANSOPRAZOLE (PREVACID) 15 MG CAPSULE    Take 15 mg by mouth daily at 12 noon.   MELATONIN 10 MG TABS    Take 10 mg by mouth at bedtime.   METHOCARBAMOL (ROBAXIN) 500 MG TABLET    Take 1 tablet (500 mg total) by mouth 2 (two) times daily with a meal.   VITAMIN D, ERGOCALCIFEROL, (DRISDOL) 50000 UNITS CAPS CAPSULE    Take 50,000 Units by mouth every 7 (seven) days.   ZOLPIDEM (AMBIEN) 10 MG TABLET    Take 10 mg by mouth at bedtime.  Modified Medications   No medications on file  Discontinued Medications   No medications on file     Allergies  Allergen Reactions  . Codeine Nausea  And Vomiting    Sick to stomach     REVIEW OF SYSTEMS:  GENERAL: no change in appetite, no fatigue, no weight changes, no fever, chills or weakness EYES: Denies change in vision, dry eyes, eye pain, itching or discharge EARS: Denies change in hearing, ringing in ears, or earache NOSE: Denies nasal congestion or epistaxis MOUTH and THROAT: Denies oral discomfort, gingival pain or bleeding, pain from teeth or hoarseness   RESPIRATORY: no cough, SOB, DOE, wheezing, hemoptysis CARDIAC: no chest pain, edema or palpitations GI: no abdominal pain, diarrhea, heart burn, nausea or vomiting, +constipation GU: Denies dysuria, frequency, hematuria, incontinence, or discharge PSYCHIATRIC: Denies feeling of depression or anxiety. No report of hallucinations, insomnia, paranoia, or agitation     PHYSICAL EXAMINATION  GENERAL APPEARANCE: Well nourished. In no acute distress. Normal body habitus SKIN:  Right knee surgical incision is covered with aquacel dressing, dry and  no erythema HEAD: Normal in size and contour. No evidence of trauma EYES: Lids open and close normally. No blepharitis, entropion or ectropion. PERRL. Conjunctivae are clear and sclerae are white. Lenses are without opacity EARS: Pinnae are normal. Patient hears normal voice tunes of the examiner MOUTH and THROAT: Lips are without lesions. Oral mucosa is moist and without lesions. Tongue is normal in shape, size, and color and without lesions NECK: supple, trachea midline, no neck masses, no thyroid tenderness, no thyromegaly LYMPHATICS: no LAN in the neck, no supraclavicular LAN RESPIRATORY: breathing is even & unlabored, BS CTAB CARDIAC: RRR, no murmur,no extra heart sounds, no edema GI: abdomen soft, normal BS, no masses, no tenderness, no hepatomegaly, no splenomegaly EXTREMITIES:  Able to move X 4 extremities PSYCHIATRIC: Alert and oriented X 3. Affect and behavior are appropriate  LABS/RADIOLOGY: Labs reviewed: Basic Metabolic Panel:  Recent Labs  08/22/15 1336 09/02/15 0510  NA 137 131*  K 4.0 4.0  CL 102 98*  CO2 29 28  GLUCOSE 106* 210*  BUN 10 6  CREATININE 0.87 0.70  CALCIUM 9.8 8.8*   CBC:  Recent Labs  08/22/15 1336 09/02/15 0510 09/03/15 0628  WBC 4.6 7.0 9.3  NEUTROABS 2.6  --   --   HGB 13.1 9.5* 10.0*  HCT 39.4 28.3* 30.5*  MCV 94.9 94.0 95.6  PLT 373 280 264     Dg Chest 2 View  Result Date: 08/22/2015 CLINICAL DATA:  Preoperative examination prior to total right knee arthroplasty. EXAM: CHEST  2 VIEW COMPARISON:  None in PACs FINDINGS: The lungs are adequately inflated. There is no focal infiltrate. There is no pleural effusion. The heart and pulmonary vascularity are normal. The mediastinum is normal in width. The bony thorax is unremarkable. IMPRESSION: There is no active cardiopulmonary disease. Electronically Signed   By: David  Martinique M.D.   On: 08/22/2015 14:04    ASSESSMENT/PLAN:  Unsteady gait - for rehabilitation, PT and OT, for  therapeutic strengthening exercises; fall precaution  Primary OA of right knee S/P right total knee arthroplasty - for rehabilitation, PT and OT, for therapeutic strengthening exercises; continue ASA EC 325 mg 1 tab PO BID for DVT prophylaxis; Dilaudid 2 mg 1 tab PO Q 6 hours PRN for pain; Robaxin 500 mg 1 tab PO BID for muscle spasm; follow-up with orthopedic surgeon, Dr. Frederik Pear, in 2 weeks  Anxiety - mood is stable; continue Xanax 0.5 mg 1 tab BID PRN  Depression - continue Wellbutrin XL 150 mg give 3 tabs = 450 mg PO daily and Pristiq ER 50 mg  1 tab daily; follows-up with Dr. Lesle Reek, psychiatrist  Seasonal rhinitis - continue Zyrtec 10 mg 1 tab Q HS and Azelastine 0.1% nasal spry 1 spray into both nostrils BID  GERD - continue Prevacid 15 mg 1 capsule PO Q 12 noon  Insomnia - continue Melatonin 10 mg 1 tab PO Q HS and Ambien 10 mg 1 tab PO Q HS  Vitamin D deficiency - continue Vitamin D2 50,000 units 1 capsule PO Q week   Constipation - start Senna-S 8.6-50 mg 2 tabs PO BID and Miralax 17 gm PO daily  Hyponatremia - NA 131; check CMP  Anemia, acute blood loss - re-check CBC Lab Results  Component Value Date   HGB 10.0 (L) 09/03/2015        Goals of care:  Short-term rehabilitation    Durenda Age, NP [facility] [phone]

## 2015-09-05 ENCOUNTER — Non-Acute Institutional Stay (SKILLED_NURSING_FACILITY): Payer: BC Managed Care – PPO | Admitting: Internal Medicine

## 2015-09-05 ENCOUNTER — Encounter: Payer: Self-pay | Admitting: Internal Medicine

## 2015-09-05 DIAGNOSIS — K219 Gastro-esophageal reflux disease without esophagitis: Secondary | ICD-10-CM | POA: Diagnosis not present

## 2015-09-05 DIAGNOSIS — K5901 Slow transit constipation: Secondary | ICD-10-CM | POA: Diagnosis not present

## 2015-09-05 DIAGNOSIS — R600 Localized edema: Secondary | ICD-10-CM

## 2015-09-05 DIAGNOSIS — R2681 Unsteadiness on feet: Secondary | ICD-10-CM | POA: Diagnosis not present

## 2015-09-05 DIAGNOSIS — E871 Hypo-osmolality and hyponatremia: Secondary | ICD-10-CM

## 2015-09-05 DIAGNOSIS — D62 Acute posthemorrhagic anemia: Secondary | ICD-10-CM | POA: Diagnosis not present

## 2015-09-05 DIAGNOSIS — M1711 Unilateral primary osteoarthritis, right knee: Secondary | ICD-10-CM | POA: Diagnosis not present

## 2015-09-05 DIAGNOSIS — F329 Major depressive disorder, single episode, unspecified: Secondary | ICD-10-CM | POA: Diagnosis not present

## 2015-09-05 LAB — BASIC METABOLIC PANEL
BUN: 6 mg/dL (ref 4–21)
Creatinine: 0.7 mg/dL (ref 0.5–1.1)
Glucose: 107 mg/dL
Potassium: 4.5 mmol/L (ref 3.4–5.3)
Sodium: 139 mmol/L (ref 137–147)

## 2015-09-05 LAB — HEPATIC FUNCTION PANEL
ALT: 10 U/L (ref 7–35)
AST: 16 U/L (ref 13–35)
Alkaline Phosphatase: 54 U/L (ref 25–125)
Bilirubin, Total: 0.5 mg/dL

## 2015-09-05 NOTE — Progress Notes (Signed)
LOCATION: [place]  PCP: Gennette Pac, MD   Code Status: Full Code  Goals of care: Advanced Directive information Advanced Directives 09/02/2015  Does patient have an advance directive? Yes  Type of Paramedic of [place];Living will  Does patient want to make changes to advanced directive? No - Patient declined  Copy of advanced directive(s) in chart? No - copy requested       Extended Emergency Contact Information Primary Emergency Contact: Driver,Jane Address: [address]          [place], [place] [address] [place] Phone: [phone] Mobile Phone: [phone] Relation: Sister Secondary Emergency Contact: Driver,Jerry Address: [address]          [place], [place] [address] Johnnette Litter of [place] Phone: [phone] Relation: Other   Allergies  Allergen Reactions  . Codeine Nausea And Vomiting    Sick to stomach    Chief Complaint  Patient presents with  . New Admit To SNF    New Admission     HPI:  Patient is a 65 y.o. female seen today for short term rehabilitation post hospital admission from 09/01/15- 09/03/15 with right knee OA. She underwent right knee arthroplasty. She is seen in her room today.   Review of Systems:  Constitutional: Negative for fever, chills, diaphoresis.  Energy level is slowly coming back. HENT: Negative for headache, congestion, nasal discharge, sore throat, difficulty swallowing.   Eyes: Negative for blurred vision, double vision and discharge.  Respiratory: Negative for cough, shortness of breath and wheezing.   Cardiovascular: Negative for chest pain, palpitations, leg swelling.  Gastrointestinal: Negative for nausea, vomiting, abdominal pain. Positive for heartburn. Last bowel movement was the day before surgery.  Genitourinary: Negative for dysuria and flank pain.  Musculoskeletal: Negative for back pain, fall in the facility.  Skin: Negative for itching, rash.    Neurological: Negative for dizziness. Psychiatric/Behavioral: Negative for depression   Past Medical History:  Diagnosis Date  . Anxiety    takes Xanax daily   . Arthritis   . Depression    takes Pristiq and Wellbutrin daily  . GERD (gastroesophageal reflux disease)   . Joint pain   . Joint swelling   . Nocturia   . Seasonal allergies    Takes Zyrtec nightly along with Nasal Spray  . Vitamin D deficiency    new script for Vit D to start 08/22/15   Past Surgical History:  Procedure Laterality Date  . blephorplasty Bilateral   . COLONOSCOPY    . left leg surgery     rod  . 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 TOTAL KNEE ARTHROPLASTY Right 09/01/2015   Procedure: TOTAL KNEE ARTHROPLASTY;  Surgeon: Frederik Pear, MD;  Location: [facility];  Service: Orthopedics;  Laterality: Right;   Social History:   reports that she has never smoked. She has never used smokeless tobacco. She reports that she drinks alcohol. She reports that she does not use drugs.  No family history on file.  Medications:   Medication List       Accurate as of 09/05/15  2:57 PM. Always use your most recent med list.          ALPRAZolam 0.5 MG tablet Commonly known as:  XANAX Take 0.5 mg by mouth 2 (two) times daily as needed for anxiety.   aspirin EC 325 MG tablet Take 1 tablet (325 mg total) by mouth 2 (two) times daily.   azelastine 0.1 %  nasal spray Commonly known as:  ASTELIN Place 1 spray into both nostrils 2 (two) times daily. Use in each nostril as directed   buPROPion 150 MG 24 hr tablet Commonly known as:  WELLBUTRIN XL Take 450 mg by mouth daily.   cetirizine 10 MG tablet Commonly known as:  ZYRTEC Take 10 mg by mouth at bedtime.   desvenlafaxine 50 MG 24 hr tablet Commonly known as:  PRISTIQ Take 50 mg by mouth daily.   HYDROmorphone 2 MG tablet Commonly known as:  DILAUDID Take 1 tablet (2 mg total) by mouth every 6 (six) hours as needed for severe pain.   lansoprazole  15 MG capsule Commonly known as:  PREVACID Take 15 mg by mouth daily at 12 noon.   Melatonin 10 MG Tabs Take 10 mg by mouth at bedtime.   methocarbamol 500 MG tablet Commonly known as:  ROBAXIN Take 1 tablet (500 mg total) by mouth 2 (two) times daily with a meal.   polyethylene glycol packet Commonly known as:  MIRALAX / GLYCOLAX Take 17 g by mouth daily.   senna-docusate 8.6-50 MG tablet Commonly known as:  Senokot-S Take 2 tablets by mouth 2 (two) times daily.   Vitamin D (Ergocalciferol) 50000 units Caps capsule Commonly known as:  DRISDOL Take 50,000 Units by mouth every 7 (seven) days.   zolpidem 10 MG tablet Commonly known as:  AMBIEN Take 10 mg by mouth at bedtime.       Immunizations:  There is no immunization history on file for this patient.   Physical Exam:  Vitals:   09/05/15 1451  BP: 121/67  Resp: 16  Temp: 98.9 F (37.2 C)  TempSrc: Oral  SpO2: 96%  Weight: 135 lb (61.2 kg)  Height: 5\' 3"  (1.6 m)   Body mass index is 23.91 kg/m.  General- adult female, well built, in no acute distress Head- normocephalic, atraumatic Nose- no nasal discharge Throat- moist mucus membrane Eyes- PERRLA, EOMI, no pallor, no icterus, no discharge, normal conjunctiva, normal sclera Neck- no cervical lymphadenopathy Cardiovascular- normal s1,s2, no murmur, 1+ right and trace left leg edema Respiratory- bilateral clear to auscultation, no wheeze, no rhonchi, no crackles, no use of accessory muscles Abdomen- bowel sounds present, soft, non tender Musculoskeletal- able to move all 4 extremities, limited right knee ROM Neurological- alert and oriented to person, place and time Skin- warm and dry, right knee surgical incision with aquacel dressing Psychiatry- normal mood and affect    Labs reviewed: Basic Metabolic Panel:  Recent Labs  08/22/15 1336 09/02/15 0510  NA 137 131*  K 4.0 4.0  CL 102 98*  CO2 29 28  GLUCOSE 106* 210*  BUN 10 6  CREATININE  0.87 0.70  CALCIUM 9.8 8.8*   Liver Function Tests: No results for input(s): AST, ALT, ALKPHOS, BILITOT, PROT, ALBUMIN in the last 8760 hours. No results for input(s): LIPASE, AMYLASE in the last 8760 hours. No results for input(s): AMMONIA in the last 8760 hours. CBC:  Recent Labs  08/22/15 1336 09/02/15 0510 09/03/15 0628  WBC 4.6 7.0 9.3  NEUTROABS 2.6  --   --   HGB 13.1 9.5* 10.0*  HCT 39.4 28.3* 30.5*  MCV 94.9 94.0 95.6  PLT 373 280 264   Cardiac Enzymes: No results for input(s): CKTOTAL, CKMB, CKMBINDEX, TROPONINI in the last 8760 hours. BNP: Invalid input(s): POCBNP CBG: No results for input(s): GLUCAP in the last 8760 hours.  Radiological Exams: Dg Chest 2 View  Result Date: 08/22/2015  CLINICAL DATA:  Preoperative examination prior to total right knee arthroplasty. EXAM: CHEST  2 VIEW COMPARISON:  None in PACs FINDINGS: The lungs are adequately inflated. There is no focal infiltrate. There is no pleural effusion. The heart and pulmonary vascularity are normal. The mediastinum is normal in width. The bony thorax is unremarkable. IMPRESSION: There is no active cardiopulmonary disease. Electronically Signed   By: David  Martinique M.D.   On: 08/22/2015 14:04    Assessment/Plan  Gait instability With right knee surgery. Will have patient work with PT/OT as tolerated to regain strength and restore function.  Fall precautions are in place.  Right knee OA S/p right TKA. Has orthopedic follow up. RLE WBAT. Will have her work with physical therapy and occupational therapy team to help with gait training and muscle strengthening exercises.fall precautions. Skin care. Encourage to be out of bed. Continue aspirin 325 mg bid for dvt prophylaxis. Continue dilaudid 2 mg q6h prn pain and robaxin 500 mg bid for muscle spasm  Leg edema Add ted hose to help with this and monitor  Blood loss anemia Post op, monitor cbc   Hyponatremia Monitor bmp  Constipation Currently on senna  s 2 tab bid and miralax daily, changes made yesterday evening, monitor. Add prune juice   gerd Change admin time of lansoprazole to 6 am. Continue 20 mg daily  Depression Continue ehr bupropion and pristiq with xanax as needed, monitor her mood    Goals of care: short term rehabilitation   Labs/tests ordered: cbc, cmp   Family/ staff Communication: reviewed care plan with patient and nursing supervisor    Blanchie Serve, MD Internal Medicine [place], [place] [address] Cell Phone (Monday-Friday 8 am - 5 pm): [phone] On Call: [phone] and follow prompts after 5 pm and on weekends Office Phone: [phone] Office Fax: [phone]

## 2015-09-12 ENCOUNTER — Encounter: Payer: Self-pay | Admitting: Adult Health

## 2015-09-12 ENCOUNTER — Non-Acute Institutional Stay (SKILLED_NURSING_FACILITY): Payer: BC Managed Care – PPO | Admitting: Adult Health

## 2015-09-12 DIAGNOSIS — R2681 Unsteadiness on feet: Secondary | ICD-10-CM | POA: Diagnosis not present

## 2015-09-12 DIAGNOSIS — D62 Acute posthemorrhagic anemia: Secondary | ICD-10-CM

## 2015-09-12 DIAGNOSIS — F329 Major depressive disorder, single episode, unspecified: Secondary | ICD-10-CM

## 2015-09-12 DIAGNOSIS — K5901 Slow transit constipation: Secondary | ICD-10-CM

## 2015-09-12 DIAGNOSIS — M1711 Unilateral primary osteoarthritis, right knee: Secondary | ICD-10-CM

## 2015-09-12 DIAGNOSIS — K219 Gastro-esophageal reflux disease without esophagitis: Secondary | ICD-10-CM

## 2015-09-12 DIAGNOSIS — J302 Other seasonal allergic rhinitis: Secondary | ICD-10-CM | POA: Diagnosis not present

## 2015-09-12 DIAGNOSIS — E559 Vitamin D deficiency, unspecified: Secondary | ICD-10-CM

## 2015-09-12 DIAGNOSIS — G4700 Insomnia, unspecified: Secondary | ICD-10-CM | POA: Diagnosis not present

## 2015-09-12 DIAGNOSIS — F419 Anxiety disorder, unspecified: Secondary | ICD-10-CM | POA: Diagnosis not present

## 2015-09-12 NOTE — Progress Notes (Signed)
Patient ID: [name], female   DOB: August 02, 1950, 65 y.o.   MRN: [id number]    DATE:  09/12/2015   MRN:  [id number]  BIRTHDAY: [birth year]  Facility:  [facility] Home Location:  [place]:  [id number]  Contact Information    Name Relation Home Work Mobile   Driver,Jane Sister [id number]  [phone]   Driver,Jerry Other [id number]         Code Status History    Date Active Date Inactive Code Status Order ID Comments User Context   09/01/2015  4:31 PM 09/03/2015  9:26 PM Full Code [id number]  Leighton Parody, PA-C Inpatient       Chief Complaint  Patient presents with  . Discharge Note    HISTORY OF PRESENT ILLNESS:  This is a 65 year old female who is for discharge home with Home health PT. She will discharge home with medications and prescriptions. DME:  Rolling walker.  She has been admitted to [facility] on 09/03/15 from [facility] with primary osteoarthritis of right knee S/P  Right total knee arthroplasty on 09/01/15.  Patient was admitted to this facility for short-term rehabilitation after the patient's recent hospitalization.  Patient has completed SNF rehabilitation and therapy has cleared the patient for discharge.   PAST MEDICAL HISTORY:  Past Medical History:  Diagnosis Date  . Anxiety    takes Xanax daily   . Arthritis   . Depression    takes Pristiq and Wellbutrin daily  . GERD (gastroesophageal reflux disease)   . Joint pain   . Joint swelling   . Nocturia   . Seasonal allergies    Takes Zyrtec nightly along with Nasal Spray  . Vitamin D deficiency    new script for Vit D to start 08/22/15     CURRENT MEDICATIONS: Reviewed  Patient's Medications  New Prescriptions   No medications on file  Previous Medications   ALPRAZOLAM (XANAX) 0.5 MG TABLET    Take 0.5 mg by mouth 2 (two) times daily as needed for anxiety.   ASPIRIN EC 325 MG TABLET    Take 1 tablet (325 mg total) by mouth 2 (two) times daily.   AZELASTINE (ASTELIN) 0.1 % NASAL SPRAY    Place 1 spray into both nostrils 2 (two) times daily. Use in each nostril as directed   BUPROPION (WELLBUTRIN XL) 150 MG 24 HR TABLET    Take 450 mg by mouth daily.    CETIRIZINE (ZYRTEC) 10 MG TABLET    Take 10 mg by mouth at bedtime.    DESVENLAFAXINE (PRISTIQ) 50 MG 24 HR TABLET    Take 50 mg by mouth daily.    HYDROMORPHONE (DILAUDID) 2 MG TABLET    Take 1 tablet (2 mg total) by mouth every 6 (six) hours as needed for severe pain.   LANSOPRAZOLE (PREVACID) 15 MG CAPSULE    Take 15 mg by mouth daily. At 6AM   MELATONIN 10 MG TABS    Take 10 mg by mouth at bedtime.   METHOCARBAMOL (ROBAXIN) 500 MG TABLET    Take 1 tablet (500 mg total) by mouth 2 (two) times daily with a meal.   POLYETHYLENE GLYCOL (MIRALAX / GLYCOLAX) PACKET    Take 17 g by mouth daily.   PROTEIN (PROCEL) POWD    Take 1 scoop by mouth 2 (two) times daily.   SENNA-DOCUSATE (SENOKOT-S) 8.6-50 MG TABLET    Take 2 tablets by mouth 2 (  two) times daily.   VITAMIN D, ERGOCALCIFEROL, (DRISDOL) 50000 UNITS CAPS CAPSULE    Take 50,000 Units by mouth every 7 (seven) days.   ZOLPIDEM (AMBIEN) 10 MG TABLET    Take 10 mg by mouth at bedtime.  Modified Medications   No medications on file  Discontinued Medications   No medications on file     Allergies  Allergen Reactions  . Codeine Nausea And Vomiting    Sick to stomach     REVIEW OF SYSTEMS:  GENERAL: no change in appetite, no fatigue, no weight changes, no fever, chills or weakness EYES: Denies change in vision, dry eyes, eye pain, itching or discharge EARS: Denies change in hearing, ringing in ears, or earache NOSE: Denies nasal congestion or epistaxis MOUTH and THROAT: Denies oral discomfort, gingival pain or bleeding, pain from teeth or hoarseness   RESPIRATORY: no cough, SOB, DOE, wheezing, hemoptysis CARDIAC: no chest pain, edema or palpitations GI: no abdominal pain, diarrhea, heart burn, nausea or vomiting GU: Denies  dysuria, frequency, hematuria, incontinence, or discharge PSYCHIATRIC: Denies feeling of depression or anxiety. No report of hallucinations, insomnia, paranoia, or agitation     PHYSICAL EXAMINATION  GENERAL APPEARANCE: Well nourished. In no acute distress. Normal body habitus SKIN:  Right knee surgical incision is healed HEAD: Normal in size and contour. No evidence of trauma EYES: Lids open and close normally. No blepharitis, entropion or ectropion. PERRL. Conjunctivae are clear and sclerae are white. Lenses are without opacity EARS: Pinnae are normal. Patient hears normal voice tunes of the examiner MOUTH and THROAT: Lips are without lesions. Oral mucosa is moist and without lesions. Tongue is normal in shape, size, and color and without lesions NECK: supple, trachea midline, no neck masses, no thyroid tenderness, no thyromegaly LYMPHATICS: no LAN in the neck, no supraclavicular LAN RESPIRATORY: breathing is even & unlabored, BS CTAB CARDIAC: RRR, no murmur,no extra heart sounds, no edema GI: abdomen soft, normal BS, no masses, no tenderness, no hepatomegaly, no splenomegaly EXTREMITIES:  Able to move X 4 extremities PSYCHIATRIC: Alert and oriented X 3. Affect and behavior are appropriate  LABS/RADIOLOGY: Labs reviewed: Basic Metabolic Panel:  Recent Labs  08/22/15 1336 09/02/15 0510 09/05/15 1332  NA 137 131* 139  K 4.0 4.0 4.5  CL 102 98*  --   CO2 29 28  --   GLUCOSE 106* 210*  --   BUN 10 6 6   CREATININE 0.87 0.70 0.7  CALCIUM 9.8 8.8*  --    CBC:  Recent Labs  08/22/15 1336 09/02/15 0510 09/03/15 0628  WBC 4.6 7.0 9.3  NEUTROABS 2.6  --   --   HGB 13.1 9.5* 10.0*  HCT 39.4 28.3* 30.5*  MCV 94.9 94.0 95.6  PLT 373 280 264     Dg Chest 2 View  Result Date: 08/22/2015 CLINICAL DATA:  Preoperative examination prior to total right knee arthroplasty. EXAM: CHEST  2 VIEW COMPARISON:  None in PACs FINDINGS: The lungs are adequately inflated. There is no focal  infiltrate. There is no pleural effusion. The heart and pulmonary vascularity are normal. The mediastinum is normal in width. The bony thorax is unremarkable. IMPRESSION: There is no active cardiopulmonary disease. Electronically Signed   By: David  Martinique M.D.   On: 08/22/2015 14:04    ASSESSMENT/PLAN:  Unsteady gait - for Home health PT, for therapeutic strengthening exercises; fall precaution  Primary OA of right knee S/P right total knee arthroplasty - for Home health PT, for therapeutic strengthening  exercises; continue ASA EC 325 mg 1 tab PO BID for DVT prophylaxis; Dilaudid 2 mg 1 tab PO Q 6 hours PRN for pain; Robaxin 500 mg 1 tab PO BID for muscle spasm; follow-up with orthopedic surgeon, Dr. Frederik Pear  Anxiety - mood is stable; continue Xanax 0.5 mg 1 tab BID PRN  Depression - continue Wellbutrin XL 150 mg give 3 tabs = 450 mg PO daily and Pristiq ER 50 mg 1 tab daily; follows-up with Dr. Lesle Reek, psychiatrist  Seasonal rhinitis - continue Zyrtec 10 mg 1 tab Q HS and Azelastine 0.1% nasal spry 1 spray into both nostrils BID  GERD - continue Prevacid 15 mg 1 capsule PO Q 12 noon  Insomnia - continue Melatonin 10 mg 1 tab PO Q HS and Ambien 10 mg 1 tab PO Q HS  Vitamin D deficiency - continue Vitamin D2 50,000 units 1 capsule PO Q week   Constipation - continue Senna-S 8.6-50 mg 2 tabs PO BID and Miralax 17 gm PO daily  Hyponatremia - NA 131;re- check NA 139, resolved  Anemia, acute blood loss - stable Lab Results  Component Value Date   HGB 10.0 (L) 09/03/2015       I have filled out patient's discharge paperwork and written prescriptions.  Patient will receive home health PT.  DME provided:  Rolling walker   Total discharge time: Greater than 30 minutes Greater than 50% was spent in counseling and coordination of care with the patient.    Discharge time involved coordination of the discharge process with social worker, nursing staff and therapy department.  Medical justification for home health services/DME verified.     Durenda Age, NP [employer] [phone]

## 2016-08-06 ENCOUNTER — Other Ambulatory Visit: Payer: Self-pay | Admitting: Family Medicine

## 2016-08-06 ENCOUNTER — Other Ambulatory Visit (HOSPITAL_COMMUNITY)
Admission: RE | Admit: 2016-08-06 | Discharge: 2016-08-06 | Disposition: A | Discharge status: Home / Self Care | Payer: BC Managed Care – PPO | Source: Ambulatory Visit | Attending: Family Medicine | Admitting: Family Medicine

## 2016-08-06 DIAGNOSIS — Z124 Encounter for screening for malignant neoplasm of cervix: Secondary | ICD-10-CM | POA: Insufficient documentation

## 2016-08-10 LAB — CYTOLOGY - PAP
Adequacy: ABSENT
Diagnosis: NEGATIVE
HPV: NOT DETECTED

## 2016-09-30 ENCOUNTER — Telehealth: Payer: Self-pay | Admitting: Endocrinology

## 2016-09-30 NOTE — Telephone Encounter (Signed)
Patient would like to move her NP appointment around due to having other plans. Call patient to move appointment.

## 2016-10-26 ENCOUNTER — Encounter: Payer: Self-pay | Admitting: Endocrinology

## 2016-10-26 ENCOUNTER — Ambulatory Visit (INDEPENDENT_AMBULATORY_CARE_PROVIDER_SITE_OTHER): Payer: BC Managed Care – PPO | Admitting: Endocrinology

## 2016-10-26 DIAGNOSIS — E559 Vitamin D deficiency, unspecified: Secondary | ICD-10-CM

## 2016-10-26 DIAGNOSIS — E871 Hypo-osmolality and hyponatremia: Secondary | ICD-10-CM

## 2016-10-26 DIAGNOSIS — I10 Essential (primary) hypertension: Secondary | ICD-10-CM | POA: Diagnosis not present

## 2016-10-26 DIAGNOSIS — G4700 Insomnia, unspecified: Secondary | ICD-10-CM

## 2016-10-26 DIAGNOSIS — K5900 Constipation, unspecified: Secondary | ICD-10-CM | POA: Diagnosis not present

## 2016-10-26 DIAGNOSIS — F419 Anxiety disorder, unspecified: Secondary | ICD-10-CM

## 2016-10-26 DIAGNOSIS — J309 Allergic rhinitis, unspecified: Secondary | ICD-10-CM

## 2016-10-26 LAB — BASIC METABOLIC PANEL
BUN: 11 mg/dL (ref 6–23)
CO2: 32 mEq/L (ref 19–32)
Calcium: 10.6 mg/dL — ABNORMAL HIGH (ref 8.4–10.5)
Chloride: 98 mEq/L (ref 96–112)
Creatinine, Ser: 0.81 mg/dL (ref 0.40–1.20)
GFR: 75.47 mL/min (ref >60.00)
Glucose, Bld: 120 mg/dL — ABNORMAL HIGH (ref 70–99)
Potassium: 4.4 mEq/L (ref 3.5–5.1)
Sodium: 136 mEq/L (ref 135–145)

## 2016-10-26 LAB — CORTISOL
Cortisol, Plasma: 5.8 ug/dL
Cortisol, Plasma: 17.7 ug/dL

## 2016-10-26 MED ORDER — COSYNTROPIN NICU IV SYRINGE 0.25 MG/ML (STANDARD DOSE)
0.2500 mg | Freq: Once | INTRAVENOUS | Status: AC
  Administered 2016-10-26: 0.25 mg via INTRAMUSCULAR

## 2016-10-26 NOTE — Progress Notes (Signed)
Subjective:    Patient ID: [name], female    DOB: 11-12-1950, 66 y.o.   MRN: [id number]  HPI Pt is referred by Dr Rex Kras, for hyponatremia.  Pt was first noted to have hyponatremia mid-2018.  She has no h/o Acei, diuretic, adrenal insuff, SIADH, cirrhosis, CHF, hypothyroidism, nephrotic synd, diarrhea, n/v, pain, brain injury, hypertriglyceridemia, schizophrenia, pulm dz, chemotx, narcotics, ssri, GBS.  Pt reports intermitt mild palpitations in the chest, and assoc anxiety.   Past Medical History:  Diagnosis Date  . Anxiety    takes Xanax daily   . Arthritis   . Depression    takes Pristiq and Wellbutrin daily  . GERD (gastroesophageal reflux disease)   . Joint pain   . Joint swelling   . Nocturia   . Seasonal allergies    Takes Zyrtec nightly along with Nasal Spray  . Vitamin D deficiency    new script for Vit D to start 08/22/15    Past Surgical History:  Procedure Laterality Date  . blephorplasty Bilateral   . COLONOSCOPY    . left leg surgery     rod  . 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 Marital status: Married    Spouse name: N/A  . Number of children: N/A  . Years of education: N/A   Occupational History  . Not on file.   Social History Main Topics  . Smoking status: Never Smoker  . Smokeless tobacco: Never Used  . Alcohol use Yes     Comment: couple times a wk  . Drug use: No  . Sexual activity: Not on file   Other Topics Concern  . Not on file   Social History Narrative  . No narrative on file    Current Outpatient Prescriptions on File Prior to Visit  Medication Sig Dispense Refill  . ALPRAZolam (XANAX) 0.5 MG tablet Take 0.5 mg by mouth 2 (two) times daily as needed for anxiety.    Marland Kitchen azelastine (ASTELIN) 0.1 % nasal spray Place 1  spray into both nostrils 2 (two) times daily. Use in each nostril as directed    . buPROPion (WELLBUTRIN XL) 150 MG 24 hr tablet Take 450 mg by mouth daily.     Marland Kitchen desvenlafaxine (PRISTIQ) 50 MG 24 hr tablet Take 50 mg by mouth daily.     . lansoprazole (PREVACID) 15 MG capsule Take 15 mg by mouth daily. At 6AM    . Melatonin 10 MG TABS Take 10 mg by mouth at bedtime.    . Vitamin D, Ergocalciferol, (DRISDOL) 50000 units CAPS capsule Take 50,000 Units by mouth every 7 (seven) days.    Marland Kitchen zolpidem (AMBIEN) 10 MG tablet Take 10 mg by mouth at bedtime.    . cetirizine (ZYRTEC) 10 MG tablet Take 10 mg by mouth at bedtime.     . polyethylene glycol (MIRALAX / GLYCOLAX) packet Take 17 g by mouth daily.     No current facility-administered medications on file prior to visit.     Allergies  Allergen Reactions  . Codeine Nausea And Vomiting    Sick to stomach    Family History  Problem Relation Age of Onset  . Other Neg Hx        hyponatremia    BP (!) 142/88 (  BP Location: Left Arm)   Pulse 97   Temp 98.8 F (37.1 C) (Oral)   Ht 5\' 4"  (1.626 m)   Wt 143 lb 3.2 oz (65 kg)   LMP  (LMP Unknown)   SpO2 91%   BMI 24.58 kg/m     Review of Systems Denies weight change, numbness, visual loss, hearing loss, sob, edema, n/v, polyuria, open wound, memory loss, back pain, cold intolerance, dry mouth, and headache.  She has chronic rhinorrhea.      Objective:   Physical Exam VS: see vs page GEN: no distress HEAD: head: no deformity eyes: no periorbital swelling, no proptosis external nose and ears are normal mouth: no lesion seen NECK: supple, thyroid is not enlarged CHEST WALL: no deformity LUNGS: clear to auscultation CV: reg rate and rhythm, no murmur ABD: abdomen is soft, nontender.  no hepatosplenomegaly.  not distended.  no hernia MUSCULOSKELETAL: muscle bulk and strength are grossly normal.  no obvious joint swelling.  gait is normal and steady EXTEMITIES: no leg edema PULSES:  dorsalis pedis intact bilat.  no carotid bruit NEURO:  cn 2-12 grossly intact.   readily moves all 4's.  sensation is intact to touch on all 4's SKIN:  Normal texture and temperature.  No rash or suspicious lesion is visible.   NODES:  None palpable at the neck PSYCH: alert, well-oriented.  Does not appear anxious nor depressed.    I personally reviewed electrocardiogram tracing (08/22/15): Indication: preop Impression: NSR.  No MI.  No hypertrophy. No comparison is available.  Lab Results  Component Value Date   CREATININE 0.81 10/26/2016   BUN 11 10/26/2016   NA 136 10/26/2016   K 4.4 10/26/2016   CL 98 10/26/2016   CO2 32 10/26/2016   ACTH stimulation test is done: baseline cortisol level=6 then Cosyntropin 250 mcg is given im 45 minutes later, cortisol level=18 (normal response)    Assessment & Plan:  Hyponatremia: new to me, mild, uncertain etiology elev BP: demeclocycline might help this.  Patient Instructions  blood tests are requested for you today.  We'll let you know about the results.   Based on the results, I would probably prescribe for you a pill called "demeclocycline." Try to limit fluid intake to 1 1/2 quarts per day, although this is difficult. Please come back for a follow-up appointment in 3 months.

## 2016-10-26 NOTE — Patient Instructions (Addendum)
blood tests are requested for you today.  We'll let you know about the results.   Based on the results, I would probably prescribe for you a pill called "demeclocycline." Try to limit fluid intake to 1 1/2 quarts per day, although this is difficult. Please come back for a follow-up appointment in 3 months.

## 2016-10-26 NOTE — Progress Notes (Signed)
Pre visit review using our clinic review tool, if applicable. No additional management support is needed unless otherwise documented below in the visit note. 

## 2016-10-27 MED ORDER — DEMECLOCYCLINE HCL 300 MG PO TABS: 300.0000 mg | ORAL_TABLET | Freq: Every day | ORAL | 5 refills | Status: DC

## 2016-10-28 DIAGNOSIS — J309 Allergic rhinitis, unspecified: Secondary | ICD-10-CM | POA: Insufficient documentation

## 2016-10-28 DIAGNOSIS — I10 Essential (primary) hypertension: Secondary | ICD-10-CM | POA: Insufficient documentation

## 2016-10-28 DIAGNOSIS — F419 Anxiety disorder, unspecified: Secondary | ICD-10-CM | POA: Insufficient documentation

## 2016-10-28 DIAGNOSIS — E559 Vitamin D deficiency, unspecified: Secondary | ICD-10-CM | POA: Insufficient documentation

## 2016-10-28 DIAGNOSIS — K5900 Constipation, unspecified: Secondary | ICD-10-CM | POA: Insufficient documentation

## 2016-10-28 DIAGNOSIS — G4700 Insomnia, unspecified: Secondary | ICD-10-CM | POA: Insufficient documentation

## 2016-11-30 ENCOUNTER — Other Ambulatory Visit: Payer: BC Managed Care – PPO

## 2017-01-26 ENCOUNTER — Ambulatory Visit: Payer: BC Managed Care – PPO | Admitting: Endocrinology

## 2017-02-04 ENCOUNTER — Encounter: Payer: Self-pay | Admitting: Endocrinology

## 2017-02-04 ENCOUNTER — Ambulatory Visit: Payer: BC Managed Care – PPO | Admitting: Endocrinology

## 2017-02-04 VITALS — BP 143/97 | HR 96 | Ht 64.0 in | Wt 149.4 lb

## 2017-02-04 DIAGNOSIS — E871 Hypo-osmolality and hyponatremia: Secondary | ICD-10-CM | POA: Diagnosis not present

## 2017-02-04 LAB — BASIC METABOLIC PANEL
BUN: 12 mg/dL (ref 6–23)
CO2: 29 mEq/L (ref 19–32)
Calcium: 9.4 mg/dL (ref 8.4–10.5)
Chloride: 100 mEq/L (ref 96–112)
Creatinine, Ser: 0.85 mg/dL (ref 0.40–1.20)
GFR: 71.32 mL/min (ref >60.00)
Glucose, Bld: 104 mg/dL — ABNORMAL HIGH (ref 70–99)
Potassium: 3.9 mEq/L (ref 3.5–5.1)
Sodium: 137 mEq/L (ref 135–145)

## 2017-02-04 NOTE — Patient Instructions (Addendum)
blood tests are requested for you today.  We'll let you know about the results.  If you get sick, especially with vomiting or diarrhea, you should this checked then, also. Please come back for a follow-up appointment in 6 months.

## 2017-02-04 NOTE — Progress Notes (Signed)
Subjective:    Patient ID: [name], female    DOB: February 07, 1950, 67 y.o.   MRN: [id number]  HPI Pt returns for f/u of hyponatremia (dx'ed 7408; uncertain etiology; ACTH stim test was normal; she was rx'ed declomycin).  On it, pt states she feels no different, and well in general.   Past Medical History:  Diagnosis Date  . Anxiety    takes Xanax daily   . Arthritis   . Depression    takes Pristiq and Wellbutrin daily  . GERD (gastroesophageal reflux disease)   . Joint pain   . Joint swelling   . Nocturia   . Seasonal allergies    Takes Zyrtec nightly along with Nasal Spray  . Vitamin D deficiency    new script for Vit D to start 08/22/15    Past Surgical History:  Procedure Laterality Date  . blephorplasty Bilateral   . COLONOSCOPY    . left leg surgery     rod  . 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 Marital status: Married    Spouse name: Not on file  . Number of children: Not on file  . Years of education: Not on file  . Highest education level: Not on file  Social Needs  . Financial resource strain: Not on file  . Food insecurity - worry: Not on file  . Food insecurity - inability: Not on file  . Transportation needs - medical: Not on file  . Transportation needs - non-medical: Not on file  Occupational History  . Not on file  Tobacco Use  . Smoking status: Never Smoker  . Smokeless tobacco: Never Used  Substance and Sexual Activity  . Alcohol use: Yes    Comment: couple times a wk  . Drug use: No  . Sexual activity: Not on file  Other Topics Concern  . Not on file  Social History Narrative  . Not on file    Current Outpatient Medications on File Prior to Visit  Medication Sig Dispense Refill  . ALPRAZolam (XANAX) 0.5 MG tablet Take  0.5 mg by mouth 2 (two) times daily as needed for anxiety.    Marland Kitchen azelastine (ASTELIN) 0.1 % nasal spray Place 1 spray into both nostrils 2 (two) times daily. Use in each nostril as directed    . buPROPion (WELLBUTRIN XL) 150 MG 24 hr tablet Take 450 mg by mouth daily.     Marland Kitchen desvenlafaxine (PRISTIQ) 50 MG 24 hr tablet Take 50 mg by mouth daily.     . lansoprazole (PREVACID) 15 MG capsule Take 15 mg by mouth daily. At 6AM    . levocetirizine (XYZAL) 5 MG tablet Take 5 mg by mouth every evening.    Marland Kitchen losartan (COZAAR) 100 MG tablet Take 100 mg by mouth daily.  1  . Melatonin 10 MG TABS Take 10 mg by mouth at bedtime.    . polyethylene glycol (MIRALAX / GLYCOLAX) packet Take 17 g by mouth daily.    . Vitamin D, Ergocalciferol, (DRISDOL) 50000 units CAPS capsule Take 50,000 Units by mouth every 7 (seven) days.    Marland Kitchen zolpidem (AMBIEN) 10 MG tablet Take 10 mg by mouth at bedtime.    . cetirizine (ZYRTEC) 10 MG tablet Take 10 mg by mouth at bedtime.  No current facility-administered medications on file prior to visit.     Allergies  Allergen Reactions  . Codeine Nausea And Vomiting    Sick to stomach    Family History  Problem Relation Age of Onset  . Other Neg Hx        hyponatremia    BP (!) 143/97 (BP Location: Left Arm, Patient Position: Sitting, Cuff Size: Large)   Pulse 96   Ht 5\' 4"  (1.626 m)   Wt 149 lb 6.4 oz (67.8 kg)   LMP  (LMP Unknown)   BMI 25.64 kg/m    Review of Systems Denies headache    Objective:   Physical Exam VITAL SIGNS:  See vs page GENERAL: no distress Ext: no leg edema.         Assessment & Plan:  Hyponatremia: due for recheck  Patient Instructions  blood tests are requested for you today.  We'll let you know about the results.  If you get sick, especially with vomiting or diarrhea, you should this checked then, also. Please come back for a follow-up appointment in 6 months.

## 2017-02-06 MED ORDER — DEMECLOCYCLINE HCL 300 MG PO TABS: 300.0000 mg | ORAL_TABLET | Freq: Every day | ORAL | 3 refills | Status: DC

## 2017-08-05 ENCOUNTER — Ambulatory Visit: Payer: Medicare Other | Admitting: Endocrinology

## 2017-08-05 ENCOUNTER — Encounter: Payer: Self-pay | Admitting: Endocrinology

## 2017-08-05 VITALS — BP 128/88 | HR 105 | Temp 98.0°F | Ht 64.0 in | Wt 146.0 lb

## 2017-08-05 DIAGNOSIS — E871 Hypo-osmolality and hyponatremia: Secondary | ICD-10-CM | POA: Diagnosis not present

## 2017-08-05 NOTE — Progress Notes (Signed)
Subjective:    Patient ID: [name], female    DOB: 03-30-1950, 67 y.o.   MRN: [id number]  HPI Pt returns for f/u of hyponatremia (dx'ed 6767; uncertain etiology; USG was 1.018 in 2017; ACTH stim test was normal; she was rx'ed declomycin).  she feels no different, and well in general.  She takes declomycin as rx'ed.   Past Medical History:  Diagnosis Date  . Anxiety    takes Xanax daily   . Arthritis   . Depression    takes Pristiq and Wellbutrin daily  . GERD (gastroesophageal reflux disease)   . Joint pain   . Joint swelling   . Nocturia   . Seasonal allergies    Takes Zyrtec nightly along with Nasal Spray  . Vitamin D deficiency    new script for Vit D to start 08/22/15    Past Surgical History:  Procedure Laterality Date  . blephorplasty Bilateral   . COLONOSCOPY    . left leg surgery     rod  . 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 Marital status: Married    Spouse name: Not on file  . Number of children: Not on file  . Years of education: Not on file  . Highest education level: Not on file  Occupational History  . Not on file  Social Needs  . Financial resource strain: Not on file  . Food insecurity:    Worry: Not on file    Inability: Not on file  . Transportation needs:    Medical: Not on file    Non-medical: Not on file  Tobacco Use  . Smoking status: Never Smoker  . Smokeless tobacco: Never Used  Substance and Sexual Activity  . Alcohol use: Yes    Comment: couple times a wk  . Drug use: No  . Sexual activity: Not on file  Lifestyle  . Physical activity:    Days per week: Not on file    Minutes per session: Not on file  . Stress: Not on file  Relationships  . Social connections:    Talks on phone: Not on file    Gets  together: Not on file    Attends religious service: Not on file    Active member of club or organization: Not on file    Attends meetings of clubs or organizations: Not on file    Relationship status: Not on file  . Intimate partner violence:    Fear of current or ex partner: Not on file    Emotionally abused: Not on file    Physically abused: Not on file    Forced sexual activity: Not on file  Other Topics Concern  . Not on file  Social History Narrative  . Not on file    Current Outpatient Medications on File Prior to Visit  Medication Sig Dispense Refill  . ALPRAZolam (XANAX) 0.5 MG tablet Take 0.5 mg by mouth 2 (two) times daily as needed for anxiety.    Marland Kitchen azelastine (ASTELIN) 0.1 % nasal spray Place 1 spray into both nostrils 2 (two) times daily. Use in each nostril as directed    . buPROPion (WELLBUTRIN XL) 150 MG 24 hr tablet Take 450 mg by mouth daily.     . cetirizine (ZYRTEC) 10  MG tablet Take 10 mg by mouth at bedtime.     Marland Kitchen demeclocycline (DECLOMYCIN) 300 MG tablet Take 1 tablet (300 mg total) by mouth daily. 90 tablet 3  . desvenlafaxine (PRISTIQ) 50 MG 24 hr tablet Take 50 mg by mouth daily.     . lansoprazole (PREVACID) 15 MG capsule Take 15 mg by mouth daily. At 6AM    . levocetirizine (XYZAL) 5 MG tablet Take 5 mg by mouth every evening.    Marland Kitchen losartan (COZAAR) 100 MG tablet Take 100 mg by mouth daily.  1  . Melatonin 10 MG TABS Take 10 mg by mouth at bedtime.    . polyethylene glycol (MIRALAX / GLYCOLAX) packet Take 17 g by mouth daily.    . Vitamin D, Ergocalciferol, (DRISDOL) 50000 units CAPS capsule Take 50,000 Units by mouth every 7 (seven) days.    Marland Kitchen zolpidem (AMBIEN) 10 MG tablet Take 10 mg by mouth at bedtime.     No current facility-administered medications on file prior to visit.     Allergies  Allergen Reactions  . Codeine Nausea And Vomiting    Sick to stomach    Family History  Problem Relation Age of Onset  . Other Neg Hx        hyponatremia      BP 128/88 (BP Location: Right Arm, Patient Position: Sitting, Cuff Size: Normal)   Pulse (!) 105   Temp 98 F (36.7 C) (Oral)   Ht 5\' 4"  (1.626 m)   Wt 146 lb (66.2 kg)   LMP  (LMP Unknown)   SpO2 99%   BMI 25.06 kg/m    Review of Systems Denies headache and nausea.      Objective:   Physical Exam VITAL SIGNS:  See vs page GENERAL: no distress NECK: There is no palpable thyroid enlargement.  No thyroid nodule is palpable.  No palpable lymphadenopathy at the anterior neck.  Ext: no leg edema  Gait: normal and steady.      Assessment & Plan:  Hyponatremia: due for recheck HTN: well-controlled   Patient Instructions  We can skip the blood test today, because Dr Rex Kras will do next week.   If you get sick, especially with vomiting or diarrhea, you should this checked then, also.   Please come back for a follow-up appointment in 6 months.

## 2017-08-05 NOTE — Patient Instructions (Addendum)
We can skip the blood test today, because Dr Rex Kras will do next week.   If you get sick, especially with vomiting or diarrhea, you should this checked then, also.   Please come back for a follow-up appointment in 6 months.

## 2017-08-18 ENCOUNTER — Other Ambulatory Visit: Payer: Self-pay | Admitting: Family Medicine

## 2017-08-18 ENCOUNTER — Other Ambulatory Visit (HOSPITAL_COMMUNITY)
Admission: RE | Admit: 2017-08-18 | Discharge: 2017-08-18 | Disposition: A | Discharge status: Home / Self Care | Payer: Medicare Other | Source: Ambulatory Visit | Attending: Family Medicine | Admitting: Family Medicine

## 2017-08-18 DIAGNOSIS — Z124 Encounter for screening for malignant neoplasm of cervix: Secondary | ICD-10-CM | POA: Diagnosis present

## 2017-08-22 LAB — CYTOLOGY - PAP
Diagnosis: UNDETERMINED — AB
HPV: NOT DETECTED

## 2018-02-06 ENCOUNTER — Encounter: Payer: Self-pay | Admitting: Endocrinology

## 2018-02-06 ENCOUNTER — Ambulatory Visit: Payer: Medicare Other | Admitting: Endocrinology

## 2018-02-06 VITALS — BP 136/82 | HR 97 | Ht 64.0 in | Wt 147.8 lb

## 2018-02-06 DIAGNOSIS — E871 Hypo-osmolality and hyponatremia: Secondary | ICD-10-CM

## 2018-02-06 LAB — BASIC METABOLIC PANEL
BUN: 9 mg/dL (ref 6–23)
CO2: 30 mEq/L (ref 19–32)
Calcium: 9.9 mg/dL (ref 8.4–10.5)
Chloride: 100 mEq/L (ref 96–112)
Creatinine, Ser: 0.88 mg/dL (ref 0.40–1.20)
GFR: 64.27 mL/min (ref >60.00)
Glucose, Bld: 109 mg/dL — ABNORMAL HIGH (ref 70–99)
Potassium: 4.5 mEq/L (ref 3.5–5.1)
Sodium: 138 mEq/L (ref 135–145)

## 2018-02-06 NOTE — Patient Instructions (Signed)
Blood tests are requested for you today.  We'll let you know about the results.  If you get sick, especially with vomiting or diarrhea, you should this checked then, also.   Please come back for a follow-up appointment in 6 months.    

## 2018-02-06 NOTE — Progress Notes (Signed)
Subjective:    Patient ID: [name], female    DOB: 1950/09/22, 68 y.o.   MRN: [id number]  HPI Pt returns for f/u of hyponatremia (dx'ed 4098; uncertain etiology; USG was 1.018 in 2017; ACTH stim test was normal; she was rx'ed declomycin).  she feels no different, and well in general.  She takes declomycin as rx'ed, but she says she does not limit fluid intake.  Past Medical History:  Diagnosis Date  . Anxiety    takes Xanax daily   . Arthritis   . Depression    takes Pristiq and Wellbutrin daily  . GERD (gastroesophageal reflux disease)   . Joint pain   . Joint swelling   . Nocturia   . Seasonal allergies    Takes Zyrtec nightly along with Nasal Spray  . Vitamin D deficiency    new script for Vit D to start 08/22/15    Past Surgical History:  Procedure Laterality Date  . blephorplasty Bilateral   . COLONOSCOPY    . left leg surgery     rod  . 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 Marital status: Married    Spouse name: Not on file  . Number of children: Not on file  . Years of education: Not on file  . Highest education level: Not on file  Occupational History  . Not on file  Social Needs  . Financial resource strain: Not on file  . Food insecurity:    Worry: Not on file    Inability: Not on file  . Transportation needs:    Medical: Not on file    Non-medical: Not on file  Tobacco Use  . Smoking status: Never Smoker  . Smokeless tobacco: Never Used  Substance and Sexual Activity  . Alcohol use: Yes    Comment: couple times a wk  . Drug use: No  . Sexual activity: Not on file  Lifestyle  . Physical activity:    Days per week: Not on file    Minutes per session: Not on file  . Stress: Not on file  Relationships  . Social connections:   Talks on phone: Not on file    Gets together: Not on file    Attends religious service: Not on file    Active member of club or organization: Not on file    Attends meetings of clubs or organizations: Not on file    Relationship status: Not on file  . Intimate partner violence:    Fear of current or ex partner: Not on file    Emotionally abused: Not on file    Physically abused: Not on file    Forced sexual activity: Not on file  Other Topics Concern  . Not on file  Social History Narrative  . Not on file    Current Outpatient Medications on File Prior to Visit  Medication Sig Dispense Refill  . ALPRAZolam (XANAX) 0.5 MG tablet Take 0.5 mg by mouth 2 (two) times daily as needed for anxiety.    Marland Kitchen azelastine (ASTELIN) 0.1 % nasal spray Place 1 spray into both nostrils 2 (two) times daily. Use in each nostril as directed    . buPROPion (WELLBUTRIN XL) 150 MG 24 hr tablet Take 450 mg by mouth daily.     Marland Kitchen  cetirizine (ZYRTEC) 10 MG tablet Take 10 mg by mouth at bedtime.     Marland Kitchen demeclocycline (DECLOMYCIN) 300 MG tablet Take 1 tablet (300 mg total) by mouth daily. 90 tablet 3  . desvenlafaxine (PRISTIQ) 50 MG 24 hr tablet Take 50 mg by mouth daily.     . lansoprazole (PREVACID) 15 MG capsule Take 15 mg by mouth daily. At 6AM    . losartan (COZAAR) 100 MG tablet Take 100 mg by mouth daily.  1  . Melatonin 10 MG TABS Take 10 mg by mouth at bedtime.    . polyethylene glycol (MIRALAX / GLYCOLAX) packet Take 17 g by mouth daily.    . Vitamin D, Ergocalciferol, (DRISDOL) 50000 units CAPS capsule Take 2,000 Units by mouth daily.     Marland Kitchen zolpidem (AMBIEN) 10 MG tablet Take 10 mg by mouth at bedtime.     No current facility-administered medications on file prior to visit.     Allergies  Allergen Reactions  . Codeine Nausea And Vomiting    Sick to stomach    Family History  Problem Relation Age of Onset  . Other Neg Hx        hyponatremia    BP 136/82 (BP Location: Left Arm, Patient  Position: Sitting, Cuff Size: Normal)   Pulse 97   Ht 5\' 4"  (1.626 m)   Wt 147 lb 12.8 oz (67 kg)   LMP  (LMP Unknown)   SpO2 96%   BMI 25.37 kg/m    Review of Systems Denies headache and nausea.      Objective:   Physical Exam VITAL SIGNS:  See vs page GENERAL: no distress Ext: no leg edema.    Lab Results  Component Value Date   CREATININE 0.88 02/06/2018   BUN 9 02/06/2018   NA 138 02/06/2018   K 4.5 02/06/2018   CL 100 02/06/2018   CO2 30 02/06/2018       Assessment & Plan:  Hyponatremia: well-controlled. Depression: meds increase the risk for worsening with any GI illness.   Patient Instructions  Blood tests are requested for you today.  We'll let you know about the results.  If you get sick, especially with vomiting or diarrhea, you should this checked then, also.   Please come back for a follow-up appointment in 6 months.

## 2018-02-07 ENCOUNTER — Telehealth: Payer: Self-pay | Admitting: Endocrinology

## 2018-02-07 NOTE — Telephone Encounter (Signed)
Closed as duplicate 

## 2018-02-07 NOTE — Telephone Encounter (Signed)
Patient returned your call.

## 2018-04-10 ENCOUNTER — Other Ambulatory Visit: Payer: Self-pay | Admitting: Endocrinology

## 2018-04-10 ENCOUNTER — Telehealth: Payer: Self-pay | Admitting: Endocrinology

## 2018-04-10 MED ORDER — DEMECLOCYCLINE HCL 300 MG PO TABS: 300.0000 mg | ORAL_TABLET | Freq: Every day | ORAL | 3 refills | Status: DC

## 2018-04-10 NOTE — Telephone Encounter (Signed)
I refilled

## 2018-04-10 NOTE — Telephone Encounter (Signed)
Please advise if this refill request is appropriate to refill.

## 2018-04-10 NOTE — Telephone Encounter (Signed)
Patient stated that she was just in our office in february and stated that when a request was sent into our office for  Demeclocycline HCl 300 MG   That this was declined do to her needing appointment. Patient would like to know why she needs to be seen when she was just seen in February   She said she only has only 3 tablets left   Please advise

## 2018-08-25 ENCOUNTER — Ambulatory Visit: Payer: Medicare Other | Admitting: Endocrinology

## 2018-08-25 ENCOUNTER — Other Ambulatory Visit: Payer: Self-pay

## 2018-08-29 ENCOUNTER — Ambulatory Visit: Payer: Medicare Other | Admitting: Endocrinology

## 2018-09-08 ENCOUNTER — Other Ambulatory Visit: Payer: Self-pay

## 2018-09-08 ENCOUNTER — Ambulatory Visit: Payer: Medicare Other | Admitting: Endocrinology

## 2018-09-08 ENCOUNTER — Encounter: Payer: Self-pay | Admitting: Endocrinology

## 2018-09-08 VITALS — BP 102/70 | HR 95 | Temp 97.6°F | Ht 64.0 in | Wt 146.6 lb

## 2018-09-08 DIAGNOSIS — E871 Hypo-osmolality and hyponatremia: Secondary | ICD-10-CM

## 2018-09-08 NOTE — Progress Notes (Signed)
Subjective:    Patient ID: [name], female    DOB: 07/23/1950, 68 y.o.   MRN: [id number]  HPI Pt returns for f/u of hyponatremia (dx'ed 99991111; uncertain etiology; USG was 1.018 in 2017; ACTH stim test was normal; she was rx'ed declomycin).  she feels no different, and well in general.  She takes declomycin as rx'ed, but she says she does not limit fluid intake.   Past Medical History:  Diagnosis Date  . Anxiety    takes Xanax daily   . Arthritis   . Depression    takes Pristiq and Wellbutrin daily  . GERD (gastroesophageal reflux disease)   . Joint pain   . Joint swelling   . Nocturia   . Seasonal allergies    Takes Zyrtec nightly along with Nasal Spray  . Vitamin D deficiency    new script for Vit D to start 08/22/15    Past Surgical History:  Procedure Laterality Date  . blephorplasty Bilateral   . COLONOSCOPY    . left leg surgery     rod  . 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 Marital status: Married    Spouse name: Not on file  . Number of children: Not on file  . Years of education: Not on file  . Highest education level: Not on file  Occupational History  . Not on file  Social Needs  . Financial resource strain: Not on file  . Food insecurity    Worry: Not on file    Inability: Not on file  . Transportation needs    Medical: Not on file    Non-medical: Not on file  Tobacco Use  . Smoking status: Never Smoker  . Smokeless tobacco: Never Used  Substance and Sexual Activity  . Alcohol use: Yes    Comment: couple times a wk  . Drug use: No  . Sexual activity: Not on file  Lifestyle  . Physical activity    Days per week: Not on file    Minutes per session: Not on file  . Stress: Not on file  Relationships  . Social Clinical research associate on phone: Not on file    Gets together: Not on file    Attends religious service: Not on file    Active member of club or organization: Not on file    Attends meetings of clubs or organizations: Not on file    Relationship status: Not on file  . Intimate partner violence    Fear of current or ex partner: Not on file    Emotionally abused: Not on file    Physically abused: Not on file    Forced sexual activity: Not on file  Other Topics Concern  . Not on file  Social History Narrative  . Not on file    Current Outpatient Medications on File Prior to Visit  Medication Sig Dispense Refill  . ALPRAZolam (XANAX) 0.5 MG tablet Take 0.5 mg by mouth 2 (two) times daily as needed for anxiety.    Marland Kitchen azelastine (ASTELIN) 0.1 % nasal spray Place 1 spray into both nostrils 2 (two) times daily. Use in each nostril as directed    . buPROPion (WELLBUTRIN XL) 150 MG 24 hr tablet Take 450 mg by mouth daily.     Marland Kitchen  cetirizine (ZYRTEC) 10 MG tablet Take 10 mg by mouth at bedtime.     Marland Kitchen demeclocycline (DECLOMYCIN) 300 MG tablet Take 1 tablet (300 mg total) by mouth daily. 90 tablet 3  . desvenlafaxine (PRISTIQ) 50 MG 24 hr tablet Take 50 mg by mouth daily.     . lansoprazole (PREVACID) 15 MG capsule Take 15 mg by mouth daily. At 6AM    . Melatonin 10 MG TABS Take 10 mg by mouth at bedtime.    . polyethylene glycol (MIRALAX / GLYCOLAX) packet Take 17 g by mouth daily.    . Vitamin D, Ergocalciferol, (DRISDOL) 50000 units CAPS capsule Take 2,000 Units by mouth daily.     Marland Kitchen zolpidem (AMBIEN) 10 MG tablet Take 10 mg by mouth at bedtime.    Marland Kitchen telmisartan (MICARDIS) 80 MG tablet Take 80 mg by mouth daily.     No current facility-administered medications on file prior to visit.     Allergies  Allergen Reactions  . Codeine Nausea And Vomiting    Sick to stomach    Family History  Problem Relation Age of Onset  . Other Neg Hx        hyponatremia    BP 102/70   Pulse 95   Temp 97.6 F (36.4 C)  (Oral)   Ht 5\' 4"  (1.626 m)   Wt 146 lb 9.6 oz (66.5 kg)   LMP  (LMP Unknown)   SpO2 95%   BMI 25.16 kg/m    Review of Systems Denies sob.      Objective:   Physical Exam VITAL SIGNS:  See vs page.   GENERAL: no distress.   Ext: trace bilat leg edema.        Assessment & Plan:  Hyponatremia: due for recheck Edema: this is prob a separate prob from the above  Patient Instructions  Please do your blood tests next week, with Dr Rex Kras.   If you get sick, especially with vomiting or diarrhea, you should this checked then, also.   Please come back for a follow-up appointment in 6 months.

## 2018-09-08 NOTE — Patient Instructions (Addendum)
Please do your blood tests next week, with Dr Rex Kras.   If you get sick, especially with vomiting or diarrhea, you should this checked then, also.   Please come back for a follow-up appointment in 6 months.

## 2018-09-13 ENCOUNTER — Other Ambulatory Visit (HOSPITAL_COMMUNITY)
Admission: RE | Admit: 2018-09-13 | Discharge: 2018-09-13 | Disposition: A | Discharge status: Home / Self Care | Payer: Medicare Other | Source: Ambulatory Visit | Attending: Family Medicine | Admitting: Family Medicine

## 2018-09-13 ENCOUNTER — Other Ambulatory Visit: Payer: Self-pay | Admitting: Family Medicine

## 2018-09-13 DIAGNOSIS — R87612 Low grade squamous intraepithelial lesion on cytologic smear of cervix (LGSIL): Secondary | ICD-10-CM | POA: Insufficient documentation

## 2018-09-13 DIAGNOSIS — Z780 Asymptomatic menopausal state: Secondary | ICD-10-CM | POA: Diagnosis not present

## 2018-09-13 DIAGNOSIS — Z87898 Personal history of other specified conditions: Secondary | ICD-10-CM | POA: Insufficient documentation

## 2018-09-13 DIAGNOSIS — Z1151 Encounter for screening for human papillomavirus (HPV): Secondary | ICD-10-CM | POA: Insufficient documentation

## 2018-09-15 ENCOUNTER — Telehealth: Payer: Self-pay | Admitting: Endocrinology

## 2018-09-15 NOTE — Telephone Encounter (Signed)
please contact patient: Sodium at Dr Eddie Dibbles was normal--good.  Please continue the same medications.  I'll see you next time.

## 2018-09-15 NOTE — Telephone Encounter (Signed)
Called pt and informed of Dr. Ellison's advice. Verbalized acceptance and understanding. 

## 2018-09-17 LAB — CYTOLOGY - PAP: HPV: NOT DETECTED

## 2018-09-26 ENCOUNTER — Other Ambulatory Visit: Payer: Self-pay | Admitting: Nurse Practitioner

## 2018-09-26 ENCOUNTER — Other Ambulatory Visit: Payer: Self-pay

## 2018-09-26 ENCOUNTER — Other Ambulatory Visit (HOSPITAL_COMMUNITY)
Admission: RE | Admit: 2018-09-26 | Discharge: 2018-09-26 | Disposition: A | Discharge status: Home / Self Care | Payer: Medicare Other | Source: Ambulatory Visit | Attending: Nurse Practitioner | Admitting: Nurse Practitioner

## 2018-09-26 DIAGNOSIS — Z124 Encounter for screening for malignant neoplasm of cervix: Secondary | ICD-10-CM | POA: Insufficient documentation

## 2018-09-26 DIAGNOSIS — Z1151 Encounter for screening for human papillomavirus (HPV): Secondary | ICD-10-CM | POA: Diagnosis not present

## 2018-09-28 LAB — CYTOLOGY - PAP
Diagnosis: NEGATIVE
High risk HPV: NEGATIVE
Molecular Disclaimer: 56
Molecular Disclaimer: DETECTED
Molecular Disclaimer: NORMAL

## 2018-09-28 LAB — CERVICOVAGINAL ANCILLARY ONLY: HPV: NOT DETECTED

## 2019-01-17 ENCOUNTER — Other Ambulatory Visit: Payer: Self-pay | Admitting: Nurse Practitioner

## 2019-01-17 DIAGNOSIS — N72 Inflammatory disease of cervix uteri: Secondary | ICD-10-CM | POA: Diagnosis not present

## 2019-01-17 DIAGNOSIS — R87619 Unspecified abnormal cytological findings in specimens from cervix uteri: Secondary | ICD-10-CM | POA: Diagnosis not present

## 2019-01-17 DIAGNOSIS — N952 Postmenopausal atrophic vaginitis: Secondary | ICD-10-CM | POA: Diagnosis not present

## 2019-03-09 ENCOUNTER — Ambulatory Visit: Payer: Medicare Other | Admitting: Endocrinology

## 2019-04-10 ENCOUNTER — Other Ambulatory Visit: Payer: Self-pay

## 2019-04-12 ENCOUNTER — Encounter: Payer: Self-pay | Admitting: Endocrinology

## 2019-04-12 ENCOUNTER — Other Ambulatory Visit: Payer: Self-pay

## 2019-04-12 ENCOUNTER — Telehealth: Payer: Self-pay

## 2019-04-12 ENCOUNTER — Ambulatory Visit: Payer: Medicare PPO | Admitting: Endocrinology

## 2019-04-12 VITALS — BP 138/88 | HR 96 | Ht 64.0 in | Wt 146.0 lb

## 2019-04-12 DIAGNOSIS — E871 Hypo-osmolality and hyponatremia: Secondary | ICD-10-CM | POA: Diagnosis not present

## 2019-04-12 LAB — BASIC METABOLIC PANEL
BUN: 13 mg/dL (ref 6–23)
CO2: 29 mEq/L (ref 19–32)
Calcium: 9.6 mg/dL (ref 8.4–10.5)
Chloride: 101 mEq/L (ref 96–112)
Creatinine, Ser: 0.91 mg/dL (ref 0.40–1.20)
GFR: 61.61 mL/min (ref >60.00)
Glucose, Bld: 113 mg/dL — ABNORMAL HIGH (ref 70–99)
Potassium: 4 mEq/L (ref 3.5–5.1)
Sodium: 138 mEq/L (ref 135–145)

## 2019-04-12 MED ORDER — DEMECLOCYCLINE HCL 300 MG PO TABS: 300.0000 mg | ORAL_TABLET | Freq: Every day | ORAL | 3 refills | Status: DC

## 2019-04-12 NOTE — Progress Notes (Signed)
Subjective:    Patient ID: [name], female    DOB: Nov 24, 1950, 70 y.o.   MRN: [id number]  HPI Pt returns for f/u of hyponatremia (dx'ed 99991111; uncertain etiology; USG was 1.018 in 2017; ACTH stim test was normal; she was rx'ed declomycin).  she feels no different, and well in general.  She takes declomycin as rx'ed, but she says she does not limit fluid intake.   Past Medical History:  Diagnosis Date  . Anxiety    takes Xanax daily   . Arthritis   . Depression    takes Pristiq and Wellbutrin daily  . GERD (gastroesophageal reflux disease)   . Joint pain   . Joint swelling   . Nocturia   . Seasonal allergies    Takes Zyrtec nightly along with Nasal Spray  . Vitamin D deficiency    new script for Vit D to start 08/22/15    Past Surgical History:  Procedure Laterality Date  . blephorplasty Bilateral   . COLONOSCOPY    . left leg surgery     rod  . 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 Marital status: Married    Spouse name: Not on file  . Number of children: Not on file  . Years of education: Not on file  . Highest education level: Not on file  Occupational History  . Not on file  Tobacco Use  . Smoking status: Never Smoker  . Smokeless tobacco: Never Used  Substance and Sexual Activity  . Alcohol use: Yes    Comment: couple times a wk  . Drug use: No  . Sexual activity: Not on file  Other Topics Concern  . Not on file  Social History Narrative  . Not on file   Social Determinants of Health   Financial Resource Strain:   . Difficulty of Paying Living Expenses:   Food Insecurity:   . Worried About Charity fundraiser in the Last Year:   . Arboriculturist in the Last Year:   Transportation Needs:   . Film/video editor (Medical):   Marland Kitchen  Lack of Transportation (Non-Medical):   Physical Activity:   . Days of Exercise per Week:   . Minutes of Exercise per Session:   Stress:   . Feeling of Stress :   Social Connections:   . Frequency of Communication with Friends and Family:   . Frequency of Social Gatherings with Friends and Family:   . Attends Religious Services:   . Active Member of Clubs or Organizations:   . Attends Archivist Meetings:   Marland Kitchen Marital Status:   Intimate Partner Violence:   . Fear of Current or Ex-Partner:   . Emotionally Abused:   Marland Kitchen Physically Abused:   . Sexually Abused:     Current Outpatient Medications on File Prior to Visit  Medication Sig Dispense Refill  . ALPRAZolam (XANAX) 0.5 MG tablet Take 0.5 mg by mouth 2 (two) times daily as needed for anxiety.    Marland Kitchen azelastine (ASTELIN) 0.1 % nasal spray Place 1 spray into both nostrils 2 (two) times daily. Use in each nostril as directed    . buPROPion (WELLBUTRIN XL) 150 MG 24 hr tablet Take 450 mg by mouth daily.     Marland Kitchen  cetirizine (ZYRTEC) 10 MG tablet Take 10 mg by mouth at bedtime.     Marland Kitchen desvenlafaxine (PRISTIQ) 50 MG 24 hr tablet Take 50 mg by mouth daily.     . lansoprazole (PREVACID) 15 MG capsule Take 15 mg by mouth daily. At 6AM    . Melatonin 10 MG TABS Take 10 mg by mouth at bedtime.    . polyethylene glycol (MIRALAX / GLYCOLAX) packet Take 17 g by mouth daily.    Marland Kitchen telmisartan (MICARDIS) 80 MG tablet Take 80 mg by mouth daily.    . Vitamin D, Ergocalciferol, (DRISDOL) 50000 units CAPS capsule Take 2,000 Units by mouth daily.     Marland Kitchen zolpidem (AMBIEN) 10 MG tablet Take 10 mg by mouth at bedtime.     No current facility-administered medications on file prior to visit.    Allergies  Allergen Reactions  . Codeine Nausea And Vomiting    Sick to stomach    Family History  Problem Relation Age of Onset  . Other Neg Hx        hyponatremia    BP 138/88   Pulse 96   Ht 5\' 4"  (1.626 m)   Wt 146 lb (66.2 kg)   LMP  (LMP  Unknown)   SpO2 99%   BMI 25.06 kg/m    Review of Systems Denies headache and nausea.      Objective:   Physical Exam VITAL SIGNS:  See vs page GENERAL: no distress Ext: no leg edema.    Lab Results  Component Value Date   CREATININE 0.91 04/12/2019   BUN 13 04/12/2019   NA 138 04/12/2019   K 4.0 04/12/2019   CL 101 04/12/2019   CO2 29 04/12/2019       Assessment & Plan:  Hyponatremia: well-controlled.  Please continue the same medication.   Patient Instructions  Blood tests are requested for you today.  We'll let you know about the results.  If you get sick, especially with vomiting or diarrhea, you should this checked then, also.   Please come back for a follow-up appointment in 6 months.

## 2019-04-12 NOTE — Telephone Encounter (Signed)
LAB RESULTS  Lab results were reviewed by Dr. Loanne Drilling. Called pt to inform about lab results as well as new orders. Using closed-loop communication, pt verbalized complete acceptance and understanding of all information provided. No further questions nor concerns were voiced at this time.

## 2019-04-12 NOTE — Telephone Encounter (Signed)
-----   Message from Renato Shin, MD sent at 04/12/2019  4:43 PM EDT ----- please contact patient: Normal.  Please continue the same medication.  I have sent a prescription to your pharmacy, to refill.  I'll see you next time.

## 2019-04-12 NOTE — Patient Instructions (Signed)
Blood tests are requested for you today.  We'll let you know about the results.  If you get sick, especially with vomiting or diarrhea, you should this checked then, also.   Please come back for a follow-up appointment in 6 months.

## 2019-04-19 LAB — ARGININE VASOPRESSIN HORMONE
ADH: <0.8 pg/mL (ref 0.0–4.7)
Osmolality Meas: 283 mOsmol/kg (ref 280–301)

## 2019-07-18 DIAGNOSIS — H1820 Unspecified corneal edema: Secondary | ICD-10-CM | POA: Diagnosis not present

## 2019-09-20 DIAGNOSIS — E871 Hypo-osmolality and hyponatremia: Secondary | ICD-10-CM | POA: Diagnosis not present

## 2019-09-20 DIAGNOSIS — I10 Essential (primary) hypertension: Secondary | ICD-10-CM | POA: Diagnosis not present

## 2019-09-20 DIAGNOSIS — N289 Disorder of kidney and ureter, unspecified: Secondary | ICD-10-CM | POA: Diagnosis not present

## 2019-09-20 DIAGNOSIS — Z87898 Personal history of other specified conditions: Secondary | ICD-10-CM | POA: Diagnosis not present

## 2019-09-20 DIAGNOSIS — E7800 Pure hypercholesterolemia, unspecified: Secondary | ICD-10-CM | POA: Diagnosis not present

## 2019-09-20 DIAGNOSIS — F419 Anxiety disorder, unspecified: Secondary | ICD-10-CM | POA: Diagnosis not present

## 2019-09-20 DIAGNOSIS — Z1159 Encounter for screening for other viral diseases: Secondary | ICD-10-CM | POA: Diagnosis not present

## 2019-09-20 DIAGNOSIS — Z0000 Encounter for general adult medical examination without abnormal findings: Secondary | ICD-10-CM | POA: Diagnosis not present

## 2019-09-20 DIAGNOSIS — J309 Allergic rhinitis, unspecified: Secondary | ICD-10-CM | POA: Diagnosis not present

## 2019-09-20 DIAGNOSIS — Z23 Encounter for immunization: Secondary | ICD-10-CM | POA: Diagnosis not present

## 2019-09-25 DIAGNOSIS — Z1231 Encounter for screening mammogram for malignant neoplasm of breast: Secondary | ICD-10-CM | POA: Diagnosis not present

## 2019-10-02 ENCOUNTER — Ambulatory Visit: Payer: Medicare PPO | Attending: Internal Medicine

## 2019-10-02 DIAGNOSIS — Z23 Encounter for immunization: Secondary | ICD-10-CM

## 2019-10-02 NOTE — Progress Notes (Signed)
   Covid-[facility] Vaccination [facility]  Name:  Tove Wideman    MRN: [id number] DOB: 1950/04/21  10/02/2019  Ms. Lick was observed post Covid-19 immunization for 15 minutes without incident. She was provided with Vaccine Information Sheet and instruction to access the V-Safe system.   Ms. Utley was instructed to call 911 with any severe reactions post vaccine: Marland Kitchen Difficulty breathing  . Swelling of face and throat  . A fast heartbeat  . A bad rash all over body  . Dizziness and weakness

## 2019-10-17 ENCOUNTER — Ambulatory Visit: Payer: Medicare PPO | Admitting: Endocrinology

## 2019-11-26 ENCOUNTER — Ambulatory Visit: Payer: Medicare PPO | Admitting: Endocrinology

## 2019-12-11 ENCOUNTER — Ambulatory Visit: Payer: Medicare PPO | Admitting: Endocrinology

## 2019-12-11 ENCOUNTER — Other Ambulatory Visit: Payer: Self-pay

## 2019-12-11 ENCOUNTER — Encounter: Payer: Self-pay | Admitting: Endocrinology

## 2019-12-11 VITALS — BP 118/62 | HR 98 | Ht 63.5 in | Wt 148.4 lb

## 2019-12-11 DIAGNOSIS — E871 Hypo-osmolality and hyponatremia: Secondary | ICD-10-CM | POA: Diagnosis not present

## 2019-12-11 NOTE — Patient Instructions (Addendum)
Please continue the same demeclocycline.   If you get sick, especially with vomiting or diarrhea, you should this checked then, also.  You do not need to limit salt.   Please come back for a follow-up appointment in 6 months.

## 2019-12-11 NOTE — Progress Notes (Signed)
Subjective:    Patient ID: [name], female    DOB: Nov 29, 1950, 69 y.o.   MRN: [id number]  HPI Pt returns for f/u of hyponatremia (dx'ed 7262; uncertain etiology; USG was 1.018 in 2017; ACTH stim test was normal; she has never had pituitary imaging; she was rx'ed declomycin).  she feels no different, and well in general.  She takes declomycin as rx'ed, but she says she does not limit fluid intake.  pt states she feels well in general.   Past Medical History:  Diagnosis Date  . Anxiety    takes Xanax daily   . Arthritis   . Depression    takes Pristiq and Wellbutrin daily  . GERD (gastroesophageal reflux disease)   . Joint pain   . Joint swelling   . Nocturia   . Seasonal allergies    Takes Zyrtec nightly along with Nasal Spray  . Vitamin D deficiency    new script for Vit D to start 08/22/15    Past Surgical History:  Procedure Laterality Date  . blephorplasty Bilateral   . COLONOSCOPY    . left leg surgery     rod  . 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 Marital status: Married    Spouse name: Not on file  . Number of children: Not on file  . Years of education: Not on file  . Highest education level: Not on file  Occupational History  . Not on file  Tobacco Use  . Smoking status: Never Smoker  . Smokeless tobacco: Never Used  Substance and Sexual Activity  . Alcohol use: Yes    Comment: couple times a wk  . Drug use: No  . Sexual activity: Not on file  Other Topics Concern  . Not on file  Social History Narrative  . Not on file   Social Determinants of Health   Financial Resource Strain: Not on file  Food Insecurity: Not on file  Transportation Needs: Not on file  Physical Activity: Not on file  Stress: Not on file  Social Connections:  Not on file  Intimate Partner Violence: Not on file    Current Outpatient Medications on File Prior to Visit  Medication Sig Dispense Refill  . ALPRAZolam (XANAX) 0.5 MG tablet Take 0.5 mg by mouth 2 (two) times daily as needed for anxiety.    Marland Kitchen azelastine (ASTELIN) 0.1 % nasal spray Place 1 spray into both nostrils 2 (two) times daily. Use in each nostril as directed    . buPROPion (WELLBUTRIN XL) 150 MG 24 hr tablet Take 450 mg by mouth daily.     . cetirizine (ZYRTEC) 10 MG tablet Take 10 mg by mouth at bedtime.     Marland Kitchen demeclocycline (DECLOMYCIN) 300 MG tablet Take 1 tablet (300 mg total) by mouth daily. 90 tablet 3  . desvenlafaxine (PRISTIQ) 50 MG 24 hr tablet Take 50 mg by mouth daily.     . lansoprazole (PREVACID) 15 MG capsule Take 15 mg by mouth daily. At 6AM    . Melatonin 10 MG TABS Take 10 mg by mouth at bedtime.    Marland Kitchen telmisartan (MICARDIS) 80 MG tablet Take 80 mg by mouth daily.    . Vitamin D, Ergocalciferol, (DRISDOL) 50000 units CAPS capsule Take 2,000 Units by  mouth daily.     Marland Kitchen zolpidem (AMBIEN) 10 MG tablet Take 10 mg by mouth at bedtime.     No current facility-administered medications on file prior to visit.    Allergies  Allergen Reactions  . Codeine Nausea And Vomiting    Sick to stomach    Family History  Problem Relation Age of Onset  . Other Neg Hx        hyponatremia    BP 118/62   Pulse 98   Ht 5' 3.5" (1.613 m)   Wt 148 lb 6.4 oz (67.3 kg)   LMP  (LMP Unknown)   SpO2 98%   BMI 25.88 kg/m   Review of Systems Denies n/v/headache    Objective:   Physical Exam VITAL SIGNS:  See vs page GENERAL: no distress EXT: trace bilat leg edema.   Lab is unavailable today   Lab Results  Component Value Date   CREATININE 0.91 04/12/2019   BUN 13 04/12/2019   NA 138 04/12/2019   K 4.0 04/12/2019   CL 101 04/12/2019   CO2 29 04/12/2019      Assessment & Plan:  Hyponatremia, on rx.   Patient Instructions  Please continue the same  demeclocycline.   If you get sick, especially with vomiting or diarrhea, you should this checked then, also.  You do not need to limit salt.   Please come back for a follow-up appointment in 6 months.

## 2020-03-08 ENCOUNTER — Emergency Department (HOSPITAL_COMMUNITY)
Admission: EM | Admit: 2020-03-08 | Discharge: 2020-03-08 | Disposition: A | Discharge status: Home / Self Care | Payer: Medicare PPO | Attending: Emergency Medicine | Admitting: Emergency Medicine

## 2020-03-08 ENCOUNTER — Emergency Department (HOSPITAL_COMMUNITY): Payer: Medicare PPO

## 2020-03-08 ENCOUNTER — Other Ambulatory Visit: Payer: Self-pay

## 2020-03-08 DIAGNOSIS — Z79899 Other long term (current) drug therapy: Secondary | ICD-10-CM | POA: Insufficient documentation

## 2020-03-08 DIAGNOSIS — Z96651 Presence of right artificial knee joint: Secondary | ICD-10-CM | POA: Insufficient documentation

## 2020-03-08 DIAGNOSIS — R55 Syncope and collapse: Secondary | ICD-10-CM | POA: Insufficient documentation

## 2020-03-08 DIAGNOSIS — R42 Dizziness and giddiness: Secondary | ICD-10-CM | POA: Diagnosis not present

## 2020-03-08 DIAGNOSIS — I10 Essential (primary) hypertension: Secondary | ICD-10-CM | POA: Insufficient documentation

## 2020-03-08 DIAGNOSIS — I959 Hypotension, unspecified: Secondary | ICD-10-CM | POA: Diagnosis not present

## 2020-03-08 DIAGNOSIS — I213 ST elevation (STEMI) myocardial infarction of unspecified site: Secondary | ICD-10-CM | POA: Diagnosis not present

## 2020-03-08 LAB — URINALYSIS, ROUTINE W REFLEX MICROSCOPIC
Bilirubin Urine: NEGATIVE
Glucose, UA: NEGATIVE mg/dL
Hgb urine dipstick: NEGATIVE
Ketones, ur: NEGATIVE mg/dL
Leukocytes,Ua: NEGATIVE
Nitrite: NEGATIVE
Protein, ur: NEGATIVE mg/dL
Specific Gravity, Urine: 1.014 (ref 1.005–1.030)
pH: 6 (ref 5.0–8.0)

## 2020-03-08 LAB — CBC WITH DIFFERENTIAL/PLATELET
Abs Immature Granulocytes: 0.05 10*3/uL (ref 0.00–0.07)
Basophils Absolute: 0.1 10*3/uL (ref 0.0–0.1)
Basophils Relative: 1 %
Eosinophils Absolute: 0.4 10*3/uL (ref 0.0–0.5)
Eosinophils Relative: 4 %
HCT: 35.2 % — ABNORMAL LOW (ref 36.0–46.0)
Hemoglobin: 12 g/dL (ref 12.0–15.0)
Immature Granulocytes: 1 %
Lymphocytes Relative: 13 %
Lymphs Abs: 1.4 10*3/uL (ref 0.7–4.0)
MCH: 32.5 pg (ref 26.0–34.0)
MCHC: 34.1 g/dL (ref 30.0–36.0)
MCV: 95.4 fL (ref 80.0–100.0)
Monocytes Absolute: 0.7 10*3/uL (ref 0.1–1.0)
Monocytes Relative: 7 %
Neutro Abs: 8.2 10*3/uL — ABNORMAL HIGH (ref 1.7–7.7)
Neutrophils Relative %: 74 %
Platelets: 382 10*3/uL (ref 150–400)
RBC: 3.69 MIL/uL — ABNORMAL LOW (ref 3.87–5.11)
RDW: 13.4 % (ref 11.5–15.5)
WBC: 10.9 10*3/uL — ABNORMAL HIGH (ref 4.0–10.5)
nRBC: 0 % (ref 0.0–0.2)

## 2020-03-08 LAB — MAGNESIUM: Magnesium: 2 mg/dL (ref 1.7–2.4)

## 2020-03-08 LAB — COMPREHENSIVE METABOLIC PANEL
ALT: 14 U/L (ref 0–44)
AST: 19 U/L (ref 15–41)
Albumin: 3.9 g/dL (ref 3.5–5.0)
Alkaline Phosphatase: 90 U/L (ref 38–126)
Anion gap: 8 (ref 5–15)
BUN: 21 mg/dL (ref 8–23)
CO2: 24 mmol/L (ref 22–32)
Calcium: 9.3 mg/dL (ref 8.9–10.3)
Chloride: 104 mmol/L (ref 98–111)
Creatinine, Ser: 1.19 mg/dL — ABNORMAL HIGH (ref 0.44–1.00)
GFR, Estimated: 50 mL/min — ABNORMAL LOW (ref >60)
Glucose, Bld: 125 mg/dL — ABNORMAL HIGH (ref 70–99)
Potassium: 3.9 mmol/L (ref 3.5–5.1)
Sodium: 136 mmol/L (ref 135–145)
Total Bilirubin: 0.6 mg/dL (ref 0.3–1.2)
Total Protein: 6.6 g/dL (ref 6.5–8.1)

## 2020-03-08 LAB — TROPONIN I (HIGH SENSITIVITY): Troponin I (High Sensitivity): 3 ng/L (ref <18)

## 2020-03-08 MED ORDER — SODIUM CHLORIDE 0.9 % IV BOLUS
500.0000 mL | Freq: Once | INTRAVENOUS | Status: AC
  Administered 2020-03-08: 500 mL via INTRAVENOUS

## 2020-03-08 NOTE — ED Provider Notes (Signed)
[place] EMERGENCY DEPARTMENT Provider Note   CSN: [id number] Arrival date & time: 03/08/20  1559     History Chief Complaint  Patient presents with  . Syncopal Episode    [name] is a 70 y.o. female presenting for evaluation of syncope.  Patient states she had just eaten lunch and felt overly full when she suddenly had nausea, lightheadedness, dizziness, diaphoresis, and went pale.  She had a near syncopal event.  She vomited x1.  EMS was called, per EMS, patient's initial systolic blood pressure was 70.  This improved with 500 cc of fluid.  Patient states currently, she has no symptoms.  She denies current headache, vision changes, dizziness, chest pain, shortness breath, cough, nausea, abdominal pain, urinary symptoms, abnormal bowel movements.  She denies chest pain during the event.  She reports history of similar events, most commonly when she has been outside for long period time or dehydrated.  Patient with a history of hyponatremia for which she takes medication.  She is also on several sedating medications including Xanax and Ambien at night, which he took last night as normal.  No recent change in medications or new additions.  No recent illnesses.  No recent sick contacts.  HPI     Past Medical History:  Diagnosis Date  . Anxiety    takes Xanax daily   . Arthritis   . Depression    takes Pristiq and Wellbutrin daily  . GERD (gastroesophageal reflux disease)   . Joint pain   . Joint swelling   . Nocturia   . Seasonal allergies    Takes Zyrtec nightly along with Nasal Spray  . Vitamin D deficiency    new script for Vit D to start 08/22/15    Patient Active Problem List   Diagnosis Date Noted  . Anxiety 10/28/2016  . Allergic rhinitis 10/28/2016  . HTN (hypertension) 10/28/2016  . Constipation 10/28/2016  . Insomnia 10/28/2016  . Vitamin D deficiency 10/28/2016  . Hyponatremia 10/26/2016  . Primary osteoarthritis of right knee 08/28/2015     Past Surgical History:  Procedure Laterality Date  . blephorplasty Bilateral   . COLONOSCOPY    . left leg surgery     rod  . 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     Family History  Problem Relation Age of Onset  . Other Neg Hx        hyponatremia    Social History   Tobacco Use  . Smoking status: Never Smoker  . Smokeless tobacco: Never Used  Substance Use Topics  . Alcohol use: Yes    Comment: couple times a wk  . Drug use: No    Home Medications Prior to Admission medications   Medication Sig Start Date End Date Taking? Authorizing Provider  ALPRAZolam Duanne Moron) 0.5 MG tablet Take 0.5 mg by mouth 2 (two) times daily as needed for anxiety.    [provider]  azelastine (ASTELIN) 0.1 % nasal spray Place 1 spray into both nostrils 2 (two) times daily. Use in each nostril as directed    [provider]  buPROPion (WELLBUTRIN XL) 150 MG 24 hr tablet Take 450 mg by mouth daily.     [provider]  cetirizine (ZYRTEC) 10 MG  tablet Take 10 mg by mouth at bedtime.     [provider]  demeclocycline (DECLOMYCIN) 300 MG tablet Take 1 tablet (300 mg total) by mouth daily. 04/12/19   Renato Shin, MD  desvenlafaxine (PRISTIQ) 50 MG 24 hr tablet Take 50 mg by mouth daily.     [provider]  lansoprazole (PREVACID) 15 MG capsule Take 15 mg by mouth daily. At 6AM    [provider]  Melatonin 10 MG TABS Take 10 mg by mouth at bedtime.    [provider]  telmisartan (MICARDIS) 80 MG tablet Take 80 mg by mouth daily. 07/26/18   [provider]  Vitamin D, Ergocalciferol, (DRISDOL) 50000 units CAPS capsule Take 2,000 Units by mouth daily.     [provider]  zolpidem (AMBIEN) 10 MG  tablet Take 10 mg by mouth at bedtime.    [provider]    Allergies    Codeine  Review of Systems   Review of Systems  Constitutional: Positive for diaphoresis (resolved).  Gastrointestinal: Positive for nausea (resolved) and vomiting (x1).  Neurological: Positive for dizziness (resolved), syncope and light-headedness (resolved).  All other systems reviewed and are negative.   Physical Exam Updated Vital Signs BP 134/90   Pulse 84   Temp 97.6 F (36.4 C) (Oral)   Resp (!) 21   LMP  (LMP Unknown)   SpO2 100%   Physical Exam Vitals and nursing note reviewed.  Constitutional:      General: She is not in acute distress.    Appearance: She is well-developed and well-nourished.     Comments: Resting in the bed in no acute distress  HENT:     Head: Normocephalic and atraumatic.  Eyes:     Extraocular Movements: Extraocular movements intact and EOM normal.     Conjunctiva/sclera: Conjunctivae normal.     Pupils: Pupils are equal, round, and reactive to light.  Cardiovascular:     Rate and Rhythm: Normal rate and regular rhythm.     Pulses: Normal pulses and intact distal pulses.  Pulmonary:     Effort: Pulmonary effort is normal. No respiratory distress.     Breath sounds: Normal breath sounds. No wheezing.  Abdominal:     General: There is no distension.     Palpations: Abdomen is soft. There is no mass.     Tenderness: There is no abdominal tenderness. There is no guarding or rebound.  Musculoskeletal:        General: Normal range of motion.     Cervical back: Normal range of motion and neck supple.  Skin:    General: Skin is warm and dry.     Capillary Refill: Capillary refill takes less than 2 seconds.  Neurological:     Mental Status: She is alert and oriented to person, place, and time.     GCS: GCS eye subscore is 4. GCS verbal subscore is 5. GCS motor subscore is 6.     Sensory: Sensation is intact.     Motor: Motor function is intact.      Comments: Strength and sensation intact x4.  Psychiatric:        Mood and Affect: Mood and affect normal.     ED Results / Procedures / Treatments   Labs (all labs ordered are listed, but only abnormal results are displayed) Labs Reviewed  CBC WITH DIFFERENTIAL/PLATELET - Abnormal; Notable for the following components:      Result Value   WBC 10.9 (*)  RBC 3.69 (*)    HCT 35.2 (*)    Neutro Abs 8.2 (*)    All other components within normal limits  COMPREHENSIVE METABOLIC PANEL - Abnormal; Notable for the following components:   Glucose, Bld 125 (*)    Creatinine, Ser 1.19 (*)    GFR, Estimated 50 (*)    All other components within normal limits  URINALYSIS, ROUTINE W REFLEX MICROSCOPIC  MAGNESIUM  TROPONIN I (HIGH SENSITIVITY)    EKG EKG Interpretation  Date/Time:  Saturday March 08 2020 16:01:04 EST Ventricular Rate:  79 PR Interval:    QRS Duration: 110 QT Interval:  434 QTC Calculation: 498 R Axis:   27 Text Interpretation: Sinus rhythm No significant change since last tracing Confirmed by Wandra Arthurs [phone]) on 03/08/2020 5:24:15 PM   Radiology CT Head Wo Contrast  Result Date: 03/08/2020 CLINICAL DATA:  Nonspecific dizziness.  Syncopal episode today. EXAM: CT HEAD WITHOUT CONTRAST TECHNIQUE: Contiguous axial images were obtained from the base of the skull through the vertex without intravenous contrast. COMPARISON:  None. FINDINGS: Brain: Generalized atrophy that is borderline advanced for age. Moderate periventricular and deep white matter hypodensity typical of chronic small vessel ischemia. No evidence of acute infarct, hemorrhage, hydrocephalus, extra-axial collection or mass lesion/mass effect. Basilar cisterns are patent. Vascular: No hyperdense vessel. Skull: No fracture or focal lesion. Sinuses/Orbits: Moderate opacification of right mastoid air cells. Left mastoid air cells are clear. Mucosal thickening of right side of sphenoid sinus. Included orbits are  unremarkable. Other: None. IMPRESSION: 1. No acute intracranial abnormality. 2. Generalized atrophy that is borderline advanced for age. Moderate chronic small vessel ischemia. 3. Moderate right mastoid air cell opacification/effusion. Electronically Signed   By: Keith Rake M.D.   On: 03/08/2020 17:05   DG Chest Portable 1 View  Result Date: 03/08/2020 CLINICAL DATA:  Dizziness, syncope EXAM: PORTABLE CHEST 1 VIEW COMPARISON:  04/22/2015 FINDINGS: Normal mediastinum and cardiac silhouette. Normal pulmonary vasculature. No evidence of effusion, infiltrate, or pneumothorax. No acute bony abnormality. IMPRESSION: No acute cardiopulmonary process. Electronically Signed   By: Suzy Bouchard M.D.   On: 03/08/2020 16:44    Procedures Procedures   Medications Ordered in ED Medications  sodium chloride 0.9 % bolus 500 mL (0 mLs Intravenous Stopped 03/08/20 1906)    ED Course  I have reviewed the triage vital signs and the nursing notes.  Pertinent labs & imaging results that were available during my care of the patient were reviewed by me and considered in my medical decision making (see chart for details).    MDM Rules/Calculators/A&P                   [facility] Syncope Rule Score: 0       Patient resenting for evaluation after a syncope/near syncope event.  Symptoms have completely resolved.  When EMS arrived, patient was hypotensive, this resolved with fluids.  This was in the setting of feeling overly full after a large meal, consider vasovagal.  However considering patient's age and history, will obtain labs, EKG, troponin, urine, head CT, chest x-ray.  If overall reassuring, and patient remains asymptomatic, can likely plan for discharge.  Will check orthostatics and continue to monitor.  Labs interpreted by me, overall reassuring.  Mild leukocytosis of 10, nonspecific.  Mild elevation in creatinine, likely secondary to dehydration.  Fluids were given.  Orthostatics negative. EKG  unchanged from previous, nonischemic.  Chest x-ray viewed interpreted by me, no pneumonia pneumothorax or effusion.  CT head negative for acute findings.  Patient without recurrent symptoms.  Case discussed with attending, Dr. Wille Glaser evaluate the patient.  Likely vasovagal syncope, patient is low risk and does not require hospitalization at this time.  She feels ready to go home. Return precautions given.  Patient states she understands and agrees to plan.  Final Clinical Impression(s) / ED Diagnoses Final diagnoses:  Vasovagal syncope    Rx / DC Orders ED Discharge Orders    None       Franchot Heidelberg, PA-C 03/08/20 1916    Drenda Freeze, MD 03/09/20 [phone]

## 2020-03-08 NOTE — ED Notes (Signed)
Disposable scrubs given to pt to wear home.

## 2020-03-08 NOTE — Discharge Instructions (Signed)
Continue taking home medications as prescribed. Make sure you stay well-hydrated with water. Follow-up with your primary care doctor next week for recheck of your symptoms. Return to the emergency room if you develop repeat episodes of passing out, chest pain, difficulty breathing, or any new, worsening, or concerning symptoms.

## 2020-03-08 NOTE — ED Notes (Signed)
Patient transported to CT 

## 2020-03-08 NOTE — ED Triage Notes (Signed)
Pt BIB [facility] d/t syncopal episode that happened during a meal, she was seated. Afterwards, she was pale/cool, diaphoretic, dizzy & felt "full." She has a Hx of HTN, EMS reports her BP being in the 70's while in route to ED.

## 2020-03-18 DIAGNOSIS — E871 Hypo-osmolality and hyponatremia: Secondary | ICD-10-CM | POA: Diagnosis not present

## 2020-03-18 DIAGNOSIS — I10 Essential (primary) hypertension: Secondary | ICD-10-CM | POA: Diagnosis not present

## 2020-03-18 DIAGNOSIS — D649 Anemia, unspecified: Secondary | ICD-10-CM | POA: Diagnosis not present

## 2020-03-21 ENCOUNTER — Other Ambulatory Visit: Payer: Self-pay | Admitting: Licensed Clinical Social Worker

## 2020-03-21 ENCOUNTER — Other Ambulatory Visit: Payer: Self-pay | Admitting: Obstetrics and Gynecology

## 2020-03-21 DIAGNOSIS — Z780 Asymptomatic menopausal state: Secondary | ICD-10-CM | POA: Diagnosis not present

## 2020-03-21 DIAGNOSIS — Z124 Encounter for screening for malignant neoplasm of cervix: Secondary | ICD-10-CM | POA: Diagnosis not present

## 2020-03-21 DIAGNOSIS — Z01419 Encounter for gynecological examination (general) (routine) without abnormal findings: Secondary | ICD-10-CM | POA: Diagnosis not present

## 2020-03-21 DIAGNOSIS — N952 Postmenopausal atrophic vaginitis: Secondary | ICD-10-CM | POA: Diagnosis not present

## 2020-03-24 DIAGNOSIS — Z124 Encounter for screening for malignant neoplasm of cervix: Secondary | ICD-10-CM | POA: Diagnosis not present

## 2020-03-25 ENCOUNTER — Other Ambulatory Visit: Payer: Self-pay | Admitting: Obstetrics and Gynecology

## 2020-03-25 DIAGNOSIS — E2839 Other primary ovarian failure: Secondary | ICD-10-CM

## 2020-03-26 ENCOUNTER — Other Ambulatory Visit: Payer: Medicare PPO

## 2020-03-28 ENCOUNTER — Encounter: Payer: Self-pay | Admitting: Internal Medicine

## 2020-03-28 ENCOUNTER — Ambulatory Visit: Payer: Medicare PPO | Admitting: Internal Medicine

## 2020-03-28 ENCOUNTER — Other Ambulatory Visit: Payer: Self-pay

## 2020-03-28 VITALS — BP 112/84 | HR 98 | Ht 63.5 in | Wt 158.0 lb

## 2020-03-28 DIAGNOSIS — Z8249 Family history of ischemic heart disease and other diseases of the circulatory system: Secondary | ICD-10-CM | POA: Diagnosis not present

## 2020-03-28 DIAGNOSIS — E782 Mixed hyperlipidemia: Secondary | ICD-10-CM

## 2020-03-28 DIAGNOSIS — F419 Anxiety disorder, unspecified: Secondary | ICD-10-CM | POA: Diagnosis not present

## 2020-03-28 DIAGNOSIS — R55 Syncope and collapse: Secondary | ICD-10-CM

## 2020-03-28 NOTE — Patient Instructions (Signed)
Medication Instructions:  Your physician recommends that you continue on your current medications as directed. Please refer to the Current Medication list given to you today.  *If you need a refill on your cardiac medications before your next appointment, please call your pharmacy*   Testing/Procedures: Dr. Debara Pickett has ordered a CT coronary calcium score. This test is done at [address]. This is $99 out of pocket.   Coronary CalciumScan A coronary calcium scan is an imaging test used to look for deposits of calcium and other fatty materials (plaques) in the inner lining of the blood vessels of the heart (coronary arteries). These deposits of calcium and plaques can partly clog and narrow the coronary arteries without producing any symptoms or warning signs. This puts a person at risk for a heart attack. This test can detect these deposits before symptoms develop. Tell a health care provider about:  Any allergies you have.  All medicines you are taking, including vitamins, herbs, eye drops, creams, and over-the-counter medicines.  Any problems you or family members have had with anesthetic medicines.  Any blood disorders you have.  Any surgeries you have had.  Any medical conditions you have.  Whether you are pregnant or may be pregnant. What are the risks? Generally, this is a safe procedure. However, problems may occur, including:  Harm to a pregnant woman and her unborn baby. This test involves the use of radiation. Radiation exposure can be dangerous to a pregnant woman and her unborn baby. If you are pregnant, you generally should not have this procedure done.  Slight increase in the risk of cancer. This is because of the radiation involved in the test. What happens before the procedure? No preparation is needed for this procedure. What happens during the procedure?  You will undress and remove any jewelry around your neck or chest.  You will put on a  hospital gown.  Sticky electrodes will be placed on your chest. The electrodes will be connected to an electrocardiogram (ECG) machine to record a tracing of the electrical activity of your heart.  A CT scanner will take pictures of your heart. During this time, you will be asked to lie still and hold your breath for 2-3 seconds while a picture of your heart is being taken. The procedure may vary among health care providers and hospitals. What happens after the procedure?  You can get dressed.  You can return to your normal activities.  It is up to you to get the results of your test. Ask your health care provider, or the department that is doing the test, when your results will be ready. Summary  A coronary calcium scan is an imaging test used to look for deposits of calcium and other fatty materials (plaques) in the inner lining of the blood vessels of the heart (coronary arteries).  Generally, this is a safe procedure. Tell your health care provider if you are pregnant or may be pregnant.  No preparation is needed for this procedure.  A CT scanner will take pictures of your heart.  You can return to your normal activities after the scan is done. This information is not intended to replace advice given to you by your health care provider. Make sure you discuss any questions you have with your health care provider. Document Released: 06/19/2007 Document Revised: 11/10/2015 Document Reviewed: 11/10/2015 Elsevier Interactive Patient Education  2017 [employer].     Follow-Up: At [facility], you and your health needs  are our priority.  As part of our continuing mission to provide you with exceptional heart care, we have created designated Provider Care Teams.  These Care Teams include your primary Cardiologist (physician) and Advanced Practice Providers (APPs -  Physician Assistants and Nurse Practitioners) who all work together to provide you with the care you need, when you need  it.  We recommend signing up for the patient portal called "MyChart".  Sign up information is provided on this After Visit Summary.  MyChart is used to connect with patients for Virtual Visits (Telemedicine).  Patients are able to view lab/test results, encounter notes, upcoming appointments, etc.  Non-urgent messages can be sent to your provider as well.   To learn more about what you can do with MyChart, go to [email or web address].    Your next appointment:   Based on test results

## 2020-03-28 NOTE — Progress Notes (Signed)
OFFICE CONSULT NOTE  Chief Complaint:  Syncope  Primary Care Physician: Destiny Stalker, PA-C  HPI:  [name] is a 70 y.o. female who is being seen today for the evaluation of syncope at the request of Little, Destiny Bihari, MD.  This is a pleasant 70 year old female kindly referred for evaluation management of syncope.  She has had a number of recent episodes of syncope including one that happened during a meal while she was seated.  She did have somewhat of a prodrome prior to that and afterwards felt pale, cool and diaphoretic.  She has a history of hyponatremia followed by Dr. Loanne Davies.  EMS work-up was unremarkable.  She was given fluids because her blood pressure was low with systolic in the 27N.  She responded to that.  She reports a history of significant anxiety.  She also has heart disease in both parents.  She had lab work that showed a total cholesterol 273, HDL 119 LDL 144 and triglycerides 63.  She also has a history of essential hypertension with good blood pressure control.  PMHx:  Past Medical History:  Diagnosis Date  . Anxiety    takes Xanax daily   . Arthritis   . Depression    takes Pristiq and Wellbutrin daily  . GERD (gastroesophageal reflux disease)   . Joint pain   . Joint swelling   . Nocturia   . Seasonal allergies    Takes Zyrtec nightly along with Nasal Spray  . Vitamin D deficiency    new script for Vit D to start 08/22/15    Past Surgical History:  Procedure Laterality Date  . blephorplasty Bilateral   . COLONOSCOPY    . left leg surgery     rod  . 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 Other Neg Hx        hyponatremia    SOCHx:   reports that she has never smoked. She has never used smokeless tobacco. She reports  current alcohol use. She reports that she does not use drugs.  ALLERGIES:  Allergies  Allergen Reactions  . Codeine Nausea And Vomiting    Sick to stomach    ROS: Pertinent items noted in HPI and remainder of comprehensive ROS otherwise negative.  HOME MEDS: Current Outpatient Medications on File Prior to Visit  Medication Sig Dispense Refill  . ALPRAZolam (XANAX) 0.5 MG tablet Take 0.5 mg by mouth 2 (two) times daily as needed for anxiety.    Marland Kitchen azelastine (ASTELIN) 0.1 % nasal spray Place 1 spray into both nostrils 2 (two) times daily. Use in each nostril as directed    . buPROPion (WELLBUTRIN XL) 150 MG 24 hr tablet Take 450 mg by mouth daily.     . cetirizine (ZYRTEC) 10 MG tablet Take 10 mg by mouth at bedtime.     Marland Kitchen demeclocycline (DECLOMYCIN) 300 MG tablet Take 1 tablet (300 mg total) by mouth daily. 90 tablet 3  . desvenlafaxine (PRISTIQ) 50 MG 24 hr tablet Take 50 mg by mouth daily.     . lansoprazole (PREVACID) 15 MG capsule Take 15 mg by mouth daily. At 6AM    . Melatonin 10 MG TABS Take 10 mg by mouth at bedtime.    Marland Kitchen telmisartan (MICARDIS) 80  MG tablet Take 80 mg by mouth daily.    . Vitamin D, Ergocalciferol, (DRISDOL) 50000 units CAPS capsule Take 2,000 Units by mouth daily.     Marland Kitchen zolpidem (AMBIEN) 10 MG tablet Take 10 mg by mouth at bedtime.     No current facility-administered medications on file prior to visit.    LABS/IMAGING: No results found for this or any previous visit (from the past 48 hour(s)). No results found.  LIPID PANEL: No results found for: CHOL, TRIG, HDL, CHOLHDL, VLDL, LDLCALC, LDLDIRECT  WEIGHTS: Wt Readings from Last 3 Encounters:  03/28/20 158 lb (71.7 kg)  12/11/19 148 lb 6.4 oz (67.3 kg)  04/12/19 146 lb (66.2 kg)    VITALS: BP 112/84 (BP Location: Left Arm, Patient Position: Sitting, Cuff Size: Normal)   Pulse 98   Ht 5' 3.5" (1.613 m)   Wt 158 lb (71.7 kg)   LMP  (LMP Unknown)   BMI 27.55 kg/m   EXAM: General appearance:  alert and no distress Neck: no carotid bruit, no JVD and thyroid not enlarged, symmetric, no tenderness/mass/nodules Lungs: clear to auscultation bilaterally Heart: regular rate and rhythm, S1, S2 normal, no murmur, click, rub or gallop Abdomen: soft, non-tender; bowel sounds normal; no masses,  no organomegaly Extremities: extremities normal, atraumatic, no cyanosis or edema Pulses: 2+ and symmetric Skin: Skin color, texture, turgor normal. No rashes or lesions Neurologic: Grossly normal Psych: Present  EKG: Sinus rhythm 98- personally reviewed  ASSESSMENT: 1. Probable vasovagal syncope 2. 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 Family history of heart disease 4. Anxiety  PLAN: 1.   [name] had probable vasovagal syncope with a prodrome and hypotension associated with her episodes that resolved with IV fluids.  Is unclear what may have led to these episodes.  She does have chronic hyponatremia.  Moreover there is a family history of heart disease.  Her cholesterol is a little high however possibly acceptable if she has no identifiable coronary disease.  I do not think she needs further work-up regarding her syncope from a cardiac standpoint but will get a calcium score to further risk stratify her with regards to heart disease in the family.  Hopefully this will be reassuring if it is low or negative.  Further management based on these findings.  Thanks for the kind referral.  Destiny Casino, MD, FACC, [place] Director of the [facility] Diplomate of the American Board of Clinical Lipidology Attending Cardiologist  Direct Dial: [phone]  Fax: [phone]  Website:  [email or web address] 03/28/2020, 4:35 PM

## 2020-04-11 ENCOUNTER — Ambulatory Visit
Admission: RE | Admit: 2020-04-11 | Discharge: 2020-04-11 | Disposition: A | Discharge status: Home / Self Care | Payer: Self-pay | Source: Ambulatory Visit | Attending: Internal Medicine | Admitting: Internal Medicine

## 2020-04-11 ENCOUNTER — Other Ambulatory Visit: Payer: Self-pay

## 2020-04-11 DIAGNOSIS — R55 Syncope and collapse: Secondary | ICD-10-CM

## 2020-04-14 ENCOUNTER — Other Ambulatory Visit: Payer: Self-pay | Admitting: *Deleted

## 2020-04-14 DIAGNOSIS — I77810 Thoracic aortic ectasia: Secondary | ICD-10-CM

## 2020-04-16 ENCOUNTER — Telehealth: Payer: Self-pay | Admitting: Internal Medicine

## 2020-04-16 DIAGNOSIS — E782 Mixed hyperlipidemia: Secondary | ICD-10-CM

## 2020-04-16 MED ORDER — ROSUVASTATIN CALCIUM 5 MG PO TABS: 5.0000 mg | ORAL_TABLET | Freq: Every day | ORAL | 3 refills | Status: DC

## 2020-04-16 NOTE — Telephone Encounter (Signed)
Pt states she received a call Monday about Dr. Debara Pickett adding a new medicine and was told she would receive a call back to let her know what medicine Dr. Debara Pickett will put her on, pt states she have not received another call letting her know what her new medicine is. Please advise

## 2020-04-16 NOTE — Telephone Encounter (Signed)
Pixie Casino, MD  Fidel Levy, RN Would recommend Crestor 5 mg daily -repeat lipid NMR in 3 months and Apo B.   Dr H   Patient called with info provided by MD Rx(s) sent to pharmacy electronically. Scheduled follow up visit at [facility] on 07/30/20 Will mail lab orders at later date

## 2020-04-16 NOTE — Telephone Encounter (Signed)
Returned call to patient, advised awaiting recommendations from MD (see calcium score result note). Patient verbalized understanding.

## 2020-06-18 ENCOUNTER — Other Ambulatory Visit: Payer: Self-pay | Admitting: *Deleted

## 2020-06-18 DIAGNOSIS — E782 Mixed hyperlipidemia: Secondary | ICD-10-CM

## 2020-06-25 ENCOUNTER — Ambulatory Visit: Payer: Medicare PPO | Admitting: Endocrinology

## 2020-06-25 ENCOUNTER — Other Ambulatory Visit: Payer: Self-pay

## 2020-06-25 VITALS — BP 150/98 | HR 95 | Ht 63.5 in | Wt 151.8 lb

## 2020-06-25 DIAGNOSIS — E871 Hypo-osmolality and hyponatremia: Secondary | ICD-10-CM

## 2020-06-25 LAB — BASIC METABOLIC PANEL
BUN: 21 mg/dL (ref 6–23)
CO2: 28 mEq/L (ref 19–32)
Calcium: 9.8 mg/dL (ref 8.4–10.5)
Chloride: 99 mEq/L (ref 96–112)
Creatinine, Ser: 1.01 mg/dL (ref 0.40–1.20)
GFR: 57.22 mL/min — ABNORMAL LOW (ref >60.00)
Glucose, Bld: 110 mg/dL — ABNORMAL HIGH (ref 70–99)
Potassium: 4.2 mEq/L (ref 3.5–5.1)
Sodium: 135 mEq/L (ref 135–145)

## 2020-06-25 LAB — TSH: TSH: 1.68 u[IU]/mL (ref 0.35–4.50)

## 2020-06-25 LAB — T4, FREE: Free T4: 0.64 ng/dL (ref 0.60–1.60)

## 2020-06-25 MED ORDER — DEMECLOCYCLINE HCL 300 MG PO TABS: 300.0000 mg | ORAL_TABLET | Freq: Every day | ORAL | 3 refills | Status: DC

## 2020-06-25 NOTE — Patient Instructions (Signed)
Please continue the same demeclocycline.   If you get sick, especially with vomiting or diarrhea, you should this checked then, also.  You do not need to limit salt.   Please come back for a follow-up appointment in 6 months.

## 2020-06-25 NOTE — Progress Notes (Signed)
Subjective:    Patient ID: [name], female    DOB: June 18, 1950, 70 y.o.   MRN: [id number]  HPI Pt returns for f/u of hyponatremia (dx'ed 4765; uncertain etiology; USG was 1.018 in 2017; ACTH stim test was normal; head CT in 2022 made no mention of the pituitary, but she has never had dedicated pituitary imaging; she was rx'ed declomycin).  she feels no different, and well in general.  She takes declomycin as rx'ed, but she says she does not limit fluid intake.  pt states she feels well in general, since syncopal episode 3 mos ago.   Past Medical History:  Diagnosis Date   Anxiety    takes Xanax daily    Arthritis    Depression    takes Pristiq and Wellbutrin daily   GERD (gastroesophageal reflux disease)    Joint pain    Joint swelling    Nocturia    Seasonal allergies    Takes Zyrtec nightly along with Nasal Spray   Vitamin D deficiency    new script for Vit D to start 08/22/15    Past Surgical History:  Procedure Laterality Date   blephorplasty Bilateral    COLONOSCOPY     left leg surgery     rod   TOTAL KNEE ARTHROPLASTY Right 09/01/2015   TOTAL KNEE ARTHROPLASTY Right 09/01/2015   Procedure: TOTAL KNEE ARTHROPLASTY;  Surgeon: Frederik Pear, MD;  Location: [facility];  Service: Orthopedics;  Laterality: Right;    Social History   Socioeconomic History   Marital status: Married    Spouse name: Not on file   Number of children: Not on file   Years of education: Not on file   Highest education level: Not on file  Occupational History   Not on file  Tobacco Use   Smoking status: Never   Smokeless tobacco: Never  Substance and Sexual Activity   Alcohol use: Yes    Comment: couple times a wk   Drug use: No   Sexual activity: Not on file  Other Topics Concern   Not on file  Social History Narrative   Not on file   Social Determinants of Health   Financial Resource Strain: Not on file  Food Insecurity: Not on file  Transportation Needs: Not on file  Physical  Activity: Not on file  Stress: Not on file  Social Connections: Not on file  Intimate Partner Violence: Not on file    Current Outpatient Medications on File Prior to Visit  Medication Sig Dispense Refill   ALPRAZolam (XANAX) 0.5 MG tablet Take 0.5 mg by mouth 2 (two) times daily as needed for anxiety.     azelastine (ASTELIN) 0.1 % nasal spray Place 1 spray into both nostrils 2 (two) times daily. Use in each nostril as directed     buPROPion (WELLBUTRIN XL) 150 MG 24 hr tablet Take 450 mg by mouth daily.      cetirizine (ZYRTEC) 10 MG tablet Take 10 mg by mouth at bedtime.      desvenlafaxine (PRISTIQ) 50 MG 24 hr tablet Take 50 mg by mouth daily.      lansoprazole (PREVACID) 15 MG capsule Take 15 mg by mouth daily. At 6AM     Melatonin 10 MG TABS Take 10 mg by mouth at bedtime.     rosuvastatin (CRESTOR) 5 MG tablet Take 1 tablet (5 mg total) by mouth daily. 90 tablet 3   telmisartan (MICARDIS) 80 MG tablet Take 80 mg by mouth daily.  Vitamin D, Ergocalciferol, (DRISDOL) 50000 units CAPS capsule Take 2,000 Units by mouth daily.      zolpidem (AMBIEN) 10 MG tablet Take 10 mg by mouth at bedtime.     No current facility-administered medications on file prior to visit.    Allergies  Allergen Reactions   Codeine Nausea And Vomiting    Sick to stomach    Family History  Problem Relation Age of Onset   Other Neg Hx        hyponatremia    BP (!) 150/98   Pulse 95   Ht 5' 3.5" (1.613 m)   Wt 151 lb 12.8 oz (68.9 kg)   LMP  (LMP Unknown)   SpO2 98%   BMI 26.47 kg/m    Review of Systems Denies HA/N/V.      Objective:   Physical Exam VITAL SIGNS:  See vs page GENERAL: no distress Ext: no leg edema.      Lab Results  Component Value Date   CREATININE 1.01 06/25/2020   BUN 21 06/25/2020   NA 135 06/25/2020   K 4.2 06/25/2020   CL 99 06/25/2020   CO2 28 06/25/2020        Assessment & Plan:  Hyponatremia: well-controlled   Patient Instructions  Please  continue the same demeclocycline.   If you get sick, especially with vomiting or diarrhea, you should this checked then, also.  You do not need to limit salt.   Please come back for a follow-up appointment in 6 months.

## 2020-06-26 LAB — SPECIMEN STATUS REPORT

## 2020-06-26 LAB — URINALYSIS, DIPSTICK ONLY
Bilirubin, UA: NEGATIVE
Glucose, UA: NEGATIVE
Ketones, UA: NEGATIVE
Nitrite, UA: NEGATIVE
Protein,UA: NEGATIVE
RBC, UA: NEGATIVE
Specific Gravity, UA: 1.02 (ref 1.005–1.030)
Urobilinogen, Ur: 0.2 mg/dL (ref 0.2–1.0)
pH, UA: 6.5 (ref 5.0–7.5)

## 2020-06-27 ENCOUNTER — Telehealth: Payer: Self-pay | Admitting: Endocrinology

## 2020-06-27 NOTE — Telephone Encounter (Signed)
Pt has received text message, regarding two medications that were called in. Pt is un aware of what these two medications are. Pt knows that she takes demeclocycline (DECLOMYCIN) 300 MG tablet Pt would like a call back regarding test results as well

## 2020-06-27 NOTE — Telephone Encounter (Signed)
Nothing else

## 2020-06-27 NOTE — Telephone Encounter (Signed)
Spoke with pt in regards to her message. I told her that as far as I can see, I only see one medication that [name] has prescribed for her in her chart and she agreed and will disregard the message that she got.  Pt would like an update on her lab results.

## 2020-07-23 DIAGNOSIS — E782 Mixed hyperlipidemia: Secondary | ICD-10-CM | POA: Diagnosis not present

## 2020-07-24 LAB — NMR, LIPOPROFILE
Cholesterol, Total: 199 mg/dL (ref 100–199)
HDL Particle Number: 41.3 umol/L (ref >=30.5)
HDL-C: 108 mg/dL (ref >39)
LDL Particle Number: 1034 nmol/L — ABNORMAL HIGH (ref <1000)
LDL Size: 21.4 nm (ref >20.5)
LDL-C (NIH Calc): 80 mg/dL (ref 0–99)
LP-IR Score: <25 (ref <=45)
Small LDL Particle Number: <90 nmol/L (ref <=527)
Triglycerides: 61 mg/dL (ref 0–149)

## 2020-07-24 LAB — APOLIPOPROTEIN B: Apolipoprotein B: 67 mg/dL (ref <90)

## 2020-07-30 ENCOUNTER — Ambulatory Visit (HOSPITAL_BASED_OUTPATIENT_CLINIC_OR_DEPARTMENT_OTHER): Payer: Medicare PPO | Admitting: Internal Medicine

## 2020-07-30 ENCOUNTER — Other Ambulatory Visit: Payer: Self-pay

## 2020-07-30 ENCOUNTER — Encounter (HOSPITAL_BASED_OUTPATIENT_CLINIC_OR_DEPARTMENT_OTHER): Payer: Self-pay | Admitting: Internal Medicine

## 2020-07-30 VITALS — BP 142/92 | HR 91 | Ht 63.5 in | Wt 150.0 lb

## 2020-07-30 DIAGNOSIS — I77810 Thoracic aortic ectasia: Secondary | ICD-10-CM

## 2020-07-30 DIAGNOSIS — E782 Mixed hyperlipidemia: Secondary | ICD-10-CM | POA: Diagnosis not present

## 2020-07-30 DIAGNOSIS — Z8249 Family history of ischemic heart disease and other diseases of the circulatory system: Secondary | ICD-10-CM

## 2020-07-30 DIAGNOSIS — R55 Syncope and collapse: Secondary | ICD-10-CM | POA: Diagnosis not present

## 2020-07-30 NOTE — Patient Instructions (Signed)
Medication Instructions:  Your physician recommends that you continue on your current medications as directed. Please refer to the Current Medication list given to you today.  *If you need a refill on your cardiac medications before your next appointment, please call your pharmacy*   Lab Work: FASTING lab work in April 2023  If you have labs (blood work) drawn today and your tests are completely normal, you will receive your results only by: [place] (if you have MyChart) OR A paper copy in the mail If you have any lab test that is abnormal or we need to change your treatment, we will call you to review the results.   Testing/Procedures: CT test in April 2023   Follow-Up: At [facility], you and your health needs are our priority.  As part of our continuing mission to provide you with exceptional heart care, we have created designated Provider Care Teams.  These Care Teams include your primary Cardiologist (physician) and Advanced Practice Providers (APPs -  Physician Assistants and Nurse Practitioners) who all work together to provide you with the care you need, when you need it.  We recommend signing up for the patient portal called "MyChart".  Sign up information is provided on this After Visit Summary.  MyChart is used to connect with patients for Virtual Visits (Telemedicine).  Patients are able to view lab/test results, encounter notes, upcoming appointments, etc.  Non-urgent messages can be sent to your provider as well.   To learn more about what you can do with MyChart, go to [email or web address].    Your next appointment:   April 2023  The format for your next appointment:   In Person  Provider:   K. Mali Hilty, MD   Other Instructions

## 2020-07-30 NOTE — Progress Notes (Signed)
OFFICE CONSULT NOTE  Chief Complaint:  Syncope  Primary Care Physician: [name], PA-C  HPI:  [name] is a 70 y.o. female who is being seen today for the evaluation of syncope at the request of [name], [place].  This is a pleasant 70 year old female kindly referred for evaluation management of syncope.  She has had a number of recent episodes of syncope including one that happened during a meal while she was seated.  She did have somewhat of a prodrome prior to that and afterwards felt pale, cool and diaphoretic.  She has a history of hyponatremia followed by Dr. Loanne Drilling.  EMS work-up was unremarkable.  She was given fluids because her blood pressure was low with systolic in the [id number].  She responded to that.  She reports a history of significant anxiety.  She also has heart disease in both parents.  She had lab work that showed a total cholesterol 273, HDL 119 LDL 144 and triglycerides 63.  She also has a history of essential hypertension with good blood pressure control.  07/30/2020  [name] returns today for follow-up.  She is done very well.  Fortunately she has had no more syncopal episodes recently.  She had a calcium score of 1 in April which was 45th percentile for age and sex matched control.  Not particularly high risk and I gave her the option of therapy for her lipids or watchful waiting.  She wanted to go ahead and start treatment and therefore I placed her on rosuvastatin.  LDL has been as high as 180 but her HDL was over 100 as well.  Subsequently repeat lipids have shown marked improvement with an LDL-P of 8034, HDL 108, small LDL-P less than 90, APO B of 67, total cholesterol 199, HDL 108, triglycerides 61 and LDL of 80.  Generally this is a very favorable lipid profile.  Unfortunately she was also found to have a dilated ascending aorta 42 mm.  This was a noncontrast study however would recommend a repeat which we have ordered for April of next year.  PMHx:   Past Medical History:  Diagnosis Date   Anxiety    takes Xanax daily    Arthritis    Depression    takes Pristiq and Wellbutrin daily   GERD (gastroesophageal reflux disease)    Joint pain    Joint swelling    Nocturia    Seasonal allergies    Takes Zyrtec nightly along with Nasal Spray   Vitamin D deficiency    new script for Vit D to start 08/22/15    Past Surgical History:  Procedure Laterality Date   blephorplasty Bilateral    COLONOSCOPY     left leg surgery     rod   TOTAL KNEE ARTHROPLASTY Right 09/01/2015   TOTAL KNEE ARTHROPLASTY Right 09/01/2015   Procedure: TOTAL KNEE ARTHROPLASTY;  Surgeon: Frederik Pear, MD;  Location: [facility];  Service: Orthopedics;  Laterality: Right;    FAMHx:  Family History  Problem Relation Age of Onset   Other Neg Hx        hyponatremia    SOCHx:   reports that she has never smoked. She has never used smokeless tobacco. She reports current alcohol use. She reports that she does not use drugs.  ALLERGIES:  Allergies  Allergen Reactions   Codeine Nausea And Vomiting    Sick to stomach    ROS: Pertinent items noted in HPI and remainder of comprehensive ROS otherwise  negative.  HOME MEDS: Current Outpatient Medications on File Prior to Visit  Medication Sig Dispense Refill   ALPRAZolam (XANAX) 0.5 MG tablet Take 0.5 mg by mouth 2 (two) times daily as needed for anxiety.     azelastine (ASTELIN) 0.1 % nasal spray Place 1 spray into both nostrils 2 (two) times daily. Use in each nostril as directed     buPROPion (WELLBUTRIN XL) 150 MG 24 hr tablet Take 450 mg by mouth daily.      cetirizine (ZYRTEC) 10 MG tablet Take 10 mg by mouth at bedtime.      demeclocycline (DECLOMYCIN) 300 MG tablet Take 1 tablet (300 mg total) by mouth daily. 90 tablet 3   desvenlafaxine (PRISTIQ) 50 MG 24 hr tablet Take 50 mg by mouth daily.      lansoprazole (PREVACID) 15 MG capsule Take 15 mg by mouth daily. At 6AM     Melatonin 10 MG TABS Take 10 mg by  mouth at bedtime.     telmisartan (MICARDIS) 80 MG tablet Take 80 mg by mouth daily.     Vitamin D, Ergocalciferol, (DRISDOL) 50000 units CAPS capsule Take 2,000 Units by mouth daily.      zolpidem (AMBIEN) 10 MG tablet Take 10 mg by mouth at bedtime.     rosuvastatin (CRESTOR) 5 MG tablet Take 1 tablet (5 mg total) by mouth daily. 90 tablet 3   No current facility-administered medications on file prior to visit.    LABS/IMAGING: No results found for this or any previous visit (from the past 48 hour(s)). No results found.  LIPID PANEL: No results found for: CHOL, TRIG, HDL, CHOLHDL, VLDL, LDLCALC, LDLDIRECT  WEIGHTS: Wt Readings from Last 3 Encounters:  07/30/20 150 lb (68 kg)  06/25/20 151 lb 12.8 oz (68.9 kg)  03/28/20 158 lb (71.7 kg)    VITALS: BP (!) 142/92   Pulse 91   Ht 5' 3.5" (1.613 m)   Wt 150 lb (68 kg)   LMP  (LMP Unknown)   SpO2 99%   BMI 26.15 kg/m   EXAM: Deferred  EKG: Deferred  ASSESSMENT: Probable vasovagal syncope Chronic hyponatremia on demeclocycline Family history of heart disease Anxiety CAC score of 1-45th percentile (04/2020) Dilated ascending aorta 42 mm (04/2020)  PLAN: 1.   [name] has had marked improvement in her dyslipidemia on low-dose of rosuvastatin.  I would continue that for now and will repeat lipids next year.  Her calcium score was low however suggested that her high HDL cholesterol was not cardioprotective.  She was also found to have a dilated ascending aorta and a repeat CT will need to be performed next year with contrast.  I will plan to see her back in follow-up with that and repeat her lipids next April.  Pixie Casino, MD, [facility], [place] Director of the Advanced Lipid Disorders &  Cardiovascular Risk Reduction Clinic Diplomate of the American Board of Clinical Lipidology Attending Cardiologist  Direct Dial: [phone]  Fax: [phone]  Website:   [email or web address] 07/30/2020, 2:24 PM

## 2020-08-07 DIAGNOSIS — H2513 Age-related nuclear cataract, bilateral: Secondary | ICD-10-CM | POA: Diagnosis not present

## 2020-09-01 ENCOUNTER — Other Ambulatory Visit: Payer: Medicare PPO

## 2020-09-30 ENCOUNTER — Inpatient Hospital Stay: Admission: RE | Admit: 2020-09-30 | Payer: Medicare PPO | Source: Ambulatory Visit

## 2020-09-30 DIAGNOSIS — I77819 Aortic ectasia, unspecified site: Secondary | ICD-10-CM | POA: Diagnosis not present

## 2020-09-30 DIAGNOSIS — E871 Hypo-osmolality and hyponatremia: Secondary | ICD-10-CM | POA: Diagnosis not present

## 2020-09-30 DIAGNOSIS — E7800 Pure hypercholesterolemia, unspecified: Secondary | ICD-10-CM | POA: Diagnosis not present

## 2020-09-30 DIAGNOSIS — N952 Postmenopausal atrophic vaginitis: Secondary | ICD-10-CM | POA: Diagnosis not present

## 2020-09-30 DIAGNOSIS — I10 Essential (primary) hypertension: Secondary | ICD-10-CM | POA: Diagnosis not present

## 2020-09-30 DIAGNOSIS — J309 Allergic rhinitis, unspecified: Secondary | ICD-10-CM | POA: Diagnosis not present

## 2020-09-30 DIAGNOSIS — Z0000 Encounter for general adult medical examination without abnormal findings: Secondary | ICD-10-CM | POA: Diagnosis not present

## 2020-09-30 DIAGNOSIS — Z23 Encounter for immunization: Secondary | ICD-10-CM | POA: Diagnosis not present

## 2020-09-30 DIAGNOSIS — F419 Anxiety disorder, unspecified: Secondary | ICD-10-CM | POA: Diagnosis not present

## 2020-10-02 DIAGNOSIS — Z1231 Encounter for screening mammogram for malignant neoplasm of breast: Secondary | ICD-10-CM | POA: Diagnosis not present

## 2020-10-08 DIAGNOSIS — R928 Other abnormal and inconclusive findings on diagnostic imaging of breast: Secondary | ICD-10-CM | POA: Diagnosis not present

## 2020-10-08 DIAGNOSIS — R922 Inconclusive mammogram: Secondary | ICD-10-CM | POA: Diagnosis not present

## 2020-10-16 ENCOUNTER — Other Ambulatory Visit: Payer: Self-pay

## 2020-10-16 DIAGNOSIS — C50811 Malignant neoplasm of overlapping sites of right female breast: Secondary | ICD-10-CM | POA: Diagnosis not present

## 2020-10-16 DIAGNOSIS — C50311 Malignant neoplasm of lower-inner quadrant of right female breast: Secondary | ICD-10-CM | POA: Diagnosis not present

## 2020-10-16 DIAGNOSIS — Z170 Estrogen receptor positive status [ER+]: Secondary | ICD-10-CM | POA: Diagnosis not present

## 2020-10-21 ENCOUNTER — Encounter: Payer: Self-pay | Admitting: Family Medicine

## 2020-10-21 ENCOUNTER — Telehealth: Payer: Self-pay | Admitting: Hematology and Oncology

## 2020-10-21 NOTE — Telephone Encounter (Signed)
SPOKE WITH PATIENT TO [facility] APPOINTMENT FOR 10/26

## 2020-10-24 ENCOUNTER — Encounter: Payer: Self-pay | Admitting: *Deleted

## 2020-10-24 DIAGNOSIS — Z170 Estrogen receptor positive status [ER+]: Secondary | ICD-10-CM | POA: Insufficient documentation

## 2020-10-24 DIAGNOSIS — C50311 Malignant neoplasm of lower-inner quadrant of right female breast: Secondary | ICD-10-CM

## 2020-10-28 NOTE — Progress Notes (Signed)
Radiation Oncology         ([phone] ________________________________  Multidisciplinary Breast Oncology Clinic [facility]) Initial Outpatient Consultation  Name: [name] MRN: [id number]  Date: 10/29/2020  DOB: Jul 30, 1950  CC:Destiny Stalker, PA-C  Destiny Bookbinder, MD   REFERRING PHYSICIAN: [name], MD  DIAGNOSIS: The encounter diagnosis was Malignant neoplasm of lower-inner quadrant of right breast of female, estrogen receptor positive ([place]).  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 Malignant neoplasm of lower-inner quadrant of right breast of female, estrogen receptor positive ([place])  C50.311    Z17.0       HISTORY OF PRESENT ILLNESS::[name] is a 70 y.o. female who is presenting to the office today for evaluation of her newly diagnosed breast cancer. She is accompanied by her sister. She is doing well overall.   She had routine screening mammography on 10/02/20 showing a possible abnormality in the right breast. She underwent bilateral diagnostic mammography at [facility] on 10/08/20 showing: an indeterminate irregular mass in the right breast at the 5-6 o'clock position, a second irregular mass at 5 o'clock anterior depth, a third irregular mass at 5 o'clock middle depth, and a stable benign oval mass at 4 o'clock posterior depth. Right breast US performed on this date further revealed the irregular masses in the lower inner right breast as suspicious.   Right breast biopsies of the masses at 6, 4, and 5 o'clock on 10/16/20 showed: grade 2 invasive mammary carcinoma and mammary carcinoma in situ. 6 o'clock mass measured 0.5 cm, 4 o'clock mass measured 0.4 cm, and the 5 o'clock mass measured 0.2 cm. Prognostic indicators significant for: estrogen receptor, 100% positive, with strong staining intensity, and progesterone receptor, 50% positive, with moderate  staining intensity. Proliferation marker Ki67 at 5%. HER2 negative.  Menarche: 70 years old LMP: in her mid to late 68's Contraceptive: Yes, used birth control pills when she was younger. Reports using some form of birth control from her 71's to her 23's most of the time. HRT: none   The patient was referred today for presentation in the multidisciplinary conference.  Radiology studies and pathology slides were presented there for review and discussion of treatment options.  A consensus was discussed regarding potential next steps.  PREVIOUS RADIATION THERAPY: No  PAST MEDICAL HISTORY:  Past Medical History:  Diagnosis Date   Anxiety    takes Xanax daily    Arthritis    Breast cancer ([place])    Depression    takes Pristiq and Wellbutrin daily   GERD (gastroesophageal reflux disease)    Joint pain    Joint swelling    Nocturia    Seasonal allergies    Takes Zyrtec nightly along with Nasal Spray   Vitamin D deficiency    new script for Vit D to start 08/22/15    PAST SURGICAL HISTORY: Past Surgical History:  Procedure Laterality Date   blephorplasty Bilateral    COLONOSCOPY     left leg surgery     rod   TOTAL KNEE ARTHROPLASTY Right 09/01/2015   TOTAL KNEE ARTHROPLASTY Right 09/01/2015   Procedure: TOTAL KNEE ARTHROPLASTY;  Surgeon: Frederik Pear, MD;  Location: [facility];  Service: Orthopedics;  Laterality: Right;    FAMILY HISTORY:  Family History  Problem Relation Age of Onset   Cancer Maternal Grandmother 31       colon cancer  Other Neg Hx        hyponatremia    SOCIAL HISTORY:  Social History   Socioeconomic History   Marital status: Widowed    Spouse name: Not on file   Number of children: 0   Years of education: Not on file   Highest education level: Not on file  Occupational History   Not on file  Tobacco Use   Smoking status: Never   Smokeless tobacco: Never  Substance and Sexual Activity   Alcohol use: Yes    Alcohol/week: 2.0 standard drinks     Types: 2 Glasses of wine per week    Comment: couple times a wk   Drug use: No   Sexual activity: Not on file  Other Topics Concern   Not on file  Social History Narrative   Not on file   Social Determinants of Health   Financial Resource Strain: Not on file  Food Insecurity: Not on file  Transportation Needs: Not on file  Physical Activity: Not on file  Stress: Not on file  Social Connections: Not on file    ALLERGIES:  Allergies  Allergen Reactions   Codeine Nausea And Vomiting    Sick to stomach    MEDICATIONS:  Current Outpatient Medications  Medication Sig Dispense Refill   ALPRAZolam (XANAX) 0.5 MG tablet Take 0.5 mg by mouth 2 (two) times daily as needed for anxiety.     azelastine (ASTELIN) 0.1 % nasal spray Place 1 spray into both nostrils 2 (two) times daily. Use in each nostril as directed     buPROPion (WELLBUTRIN XL) 150 MG 24 hr tablet Take 450 mg by mouth daily.      cetirizine (ZYRTEC) 10 MG tablet Take 10 mg by mouth at bedtime.      demeclocycline (DECLOMYCIN) 300 MG tablet Take 1 tablet (300 mg total) by mouth daily. 90 tablet 3   desvenlafaxine (PRISTIQ) 50 MG 24 hr tablet Take 50 mg by mouth daily.      lansoprazole (PREVACID) 15 MG capsule Take 15 mg by mouth daily. At 6AM     Melatonin 10 MG TABS Take 10 mg by mouth at bedtime.     rosuvastatin (CRESTOR) 5 MG tablet Take 1 tablet (5 mg total) by mouth daily. 90 tablet 3   telmisartan (MICARDIS) 80 MG tablet Take 80 mg by mouth daily.     Vitamin D, Ergocalciferol, (DRISDOL) 50000 units CAPS capsule Take 2,000 Units by mouth daily.      zolpidem (AMBIEN) 10 MG tablet Take 10 mg by mouth at bedtime.     No current facility-administered medications for this encounter.    REVIEW OF SYSTEMS: A 10+ POINT REVIEW OF SYSTEMS WAS OBTAINED including neurology, dermatology, psychiatry, cardiac, respiratory, lymph, extremities, GI, GU, musculoskeletal, constitutional, reproductive, HEENT. On the provided form,  she reports weight changes, occasional loss of sleep, wearing glasses, runny nose, sinus problems, hoarse voice, poor appetite, heart burn, urinary frequency, arthritis at times in her replaced knee, anxiety, depression, and hot flashes. She denies any other symptoms.    PHYSICAL EXAM:   Vitals with BMI 10/29/2020  Height 5' 3.5"  Weight 151 lbs 5 oz  BMI 46.96  Systolic 295  Diastolic 80  Pulse 284    Lungs are clear to auscultation bilaterally. Heart has regular rate and rhythm. No palpable cervical, supraclavicular, or axillary adenopathy. Abdomen soft, non-tender, normal bowel sounds. Breast: Left breast with no palpable mass, nipple discharge, or bleeding, left breast is  somewhat large and pendulous. Right breast with significant bruising and hematoma in inferior aspect of the breast from biopsies. No nipple discharge or bleeding.  KPS = 90  100 - Normal; no complaints; no evidence of disease. 90   - Able to carry on normal activity; minor signs or symptoms of disease. 80   - Normal activity with effort; some signs or symptoms of disease. 59   - Cares for self; unable to carry on normal activity or to do active work. 60   - Requires occasional assistance, but is able to care for most of his personal needs. 50   - Requires considerable assistance and frequent medical care. 68   - Disabled; requires special care and assistance. 58   - Severely disabled; hospital admission is indicated although death not imminent. 46   - Very sick; hospital admission necessary; active supportive treatment necessary. 10   - Moribund; fatal processes progressing rapidly. 0     - Dead  Karnofsky DA, Abelmann [place], Craver LS and Burchenal JH [phone]) The use of the nitrogen mustards in the palliative treatment of carcinoma: with particular reference to bronchogenic carcinoma Cancer 1 634-56  LABORATORY DATA:  Lab Results  Component Value Date   WBC 6.2 10/29/2020   HGB 13.6 10/29/2020   HCT 39.9  10/29/2020   MCV 93.4 10/29/2020   PLT 352 10/29/2020   Lab Results  Component Value Date   NA 139 10/29/2020   K 4.1 10/29/2020   CL 103 10/29/2020   CO2 25 10/29/2020   Lab Results  Component Value Date   ALT 12 10/29/2020   AST 19 10/29/2020   ALKPHOS 96 10/29/2020   BILITOT 0.6 10/29/2020    PULMONARY FUNCTION TEST:   Recent Review Flowsheet Data   There is no flowsheet data to display.     RADIOGRAPHY: No results found.    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 We discussed the general course of radiation, potential side effects, and toxicities with radiation and the patient is interested in this approach.   Patient has large pendulous breast and is interested in breast reduction which would facilitate radiation treatment delivery. She will see Dr. Iran Planas in the near future for consultation concerning bilateral breast reduction.  PLAN:  Genetics Right lumpectomy and SNL   Oncotype Bilateral breast reduction  Adjuvant radiation therapy  Aromatase inhibitor     ------------------------------------------------  Blair Promise, PhD, MD  This document serves as a record of services personally performed by [name], MD. It was created on his behalf by Roney Mans, a trained medical scribe. The creation of this record is based on the scribe's personal observations and the provider's statements to them. This document has been checked and approved by the attending provider.

## 2020-10-29 ENCOUNTER — Ambulatory Visit: Payer: Medicare PPO | Admitting: Physical Therapy

## 2020-10-29 ENCOUNTER — Encounter: Payer: Self-pay | Admitting: General Practice

## 2020-10-29 ENCOUNTER — Inpatient Hospital Stay (HOSPITAL_BASED_OUTPATIENT_CLINIC_OR_DEPARTMENT_OTHER): Payer: Medicare PPO | Admitting: Hematology

## 2020-10-29 ENCOUNTER — Other Ambulatory Visit: Payer: Self-pay

## 2020-10-29 ENCOUNTER — Encounter: Payer: Self-pay | Admitting: Physical Therapy

## 2020-10-29 ENCOUNTER — Ambulatory Visit
Admission: RE | Admit: 2020-10-29 | Discharge: 2020-10-29 | Disposition: A | Discharge status: Home / Self Care | Payer: Medicare PPO | Source: Ambulatory Visit | Attending: Radiation Oncology | Admitting: Radiation Oncology

## 2020-10-29 ENCOUNTER — Inpatient Hospital Stay: Payer: Medicare PPO | Attending: Hematology

## 2020-10-29 ENCOUNTER — Encounter: Payer: Self-pay | Admitting: Hematology

## 2020-10-29 VITALS — BP 143/80 | HR 103 | Temp 97.5°F | Resp 19 | Ht 63.5 in | Wt 151.3 lb

## 2020-10-29 DIAGNOSIS — N6315 Unspecified lump in the right breast, overlapping quadrants: Secondary | ICD-10-CM | POA: Diagnosis not present

## 2020-10-29 DIAGNOSIS — F32A Depression, unspecified: Secondary | ICD-10-CM

## 2020-10-29 DIAGNOSIS — R350 Frequency of micturition: Secondary | ICD-10-CM | POA: Insufficient documentation

## 2020-10-29 DIAGNOSIS — R12 Heartburn: Secondary | ICD-10-CM | POA: Insufficient documentation

## 2020-10-29 DIAGNOSIS — C50311 Malignant neoplasm of lower-inner quadrant of right female breast: Secondary | ICD-10-CM

## 2020-10-29 DIAGNOSIS — Z170 Estrogen receptor positive status [ER+]: Secondary | ICD-10-CM

## 2020-10-29 DIAGNOSIS — R293 Abnormal posture: Secondary | ICD-10-CM | POA: Diagnosis not present

## 2020-10-29 DIAGNOSIS — F419 Anxiety disorder, unspecified: Secondary | ICD-10-CM | POA: Diagnosis not present

## 2020-10-29 DIAGNOSIS — K5900 Constipation, unspecified: Secondary | ICD-10-CM | POA: Insufficient documentation

## 2020-10-29 DIAGNOSIS — E2839 Other primary ovarian failure: Secondary | ICD-10-CM

## 2020-10-29 DIAGNOSIS — Z885 Allergy status to narcotic agent status: Secondary | ICD-10-CM | POA: Insufficient documentation

## 2020-10-29 DIAGNOSIS — Z800 Family history of malignant neoplasm of digestive organs: Secondary | ICD-10-CM | POA: Diagnosis not present

## 2020-10-29 LAB — CBC WITH DIFFERENTIAL (CANCER CENTER ONLY)
Abs Immature Granulocytes: 0.01 10*3/uL (ref 0.00–0.07)
Basophils Absolute: 0.1 10*3/uL (ref 0.0–0.1)
Basophils Relative: 1 %
Eosinophils Absolute: 0.2 10*3/uL (ref 0.0–0.5)
Eosinophils Relative: 4 %
HCT: 39.9 % (ref 36.0–46.0)
Hemoglobin: 13.6 g/dL (ref 12.0–15.0)
Immature Granulocytes: 0 %
Lymphocytes Relative: 28 %
Lymphs Abs: 1.7 10*3/uL (ref 0.7–4.0)
MCH: 31.9 pg (ref 26.0–34.0)
MCHC: 34.1 g/dL (ref 30.0–36.0)
MCV: 93.4 fL (ref 80.0–100.0)
Monocytes Absolute: 0.6 10*3/uL (ref 0.1–1.0)
Monocytes Relative: 10 %
Neutro Abs: 3.6 10*3/uL (ref 1.7–7.7)
Neutrophils Relative %: 57 %
Platelet Count: 352 10*3/uL (ref 150–400)
RBC: 4.27 MIL/uL (ref 3.87–5.11)
RDW: 12.6 % (ref 11.5–15.5)
WBC Count: 6.2 10*3/uL (ref 4.0–10.5)
nRBC: 0 % (ref 0.0–0.2)

## 2020-10-29 LAB — CMP (CANCER CENTER ONLY)
ALT: 12 U/L (ref 0–44)
AST: 19 U/L (ref 15–41)
Albumin: 4.6 g/dL (ref 3.5–5.0)
Alkaline Phosphatase: 96 U/L (ref 38–126)
Anion gap: 11 (ref 5–15)
BUN: 18 mg/dL (ref 8–23)
CO2: 25 mmol/L (ref 22–32)
Calcium: 10 mg/dL (ref 8.9–10.3)
Chloride: 103 mmol/L (ref 98–111)
Creatinine: 0.99 mg/dL (ref 0.44–1.00)
GFR, Estimated: >60 mL/min (ref >60)
Glucose, Bld: 54 mg/dL — ABNORMAL LOW (ref 70–99)
Potassium: 4.1 mmol/L (ref 3.5–5.1)
Sodium: 139 mmol/L (ref 135–145)
Total Bilirubin: 0.6 mg/dL (ref 0.3–1.2)
Total Protein: 7.7 g/dL (ref 6.5–8.1)

## 2020-10-29 LAB — GENETIC SCREENING ORDER

## 2020-10-29 NOTE — Therapy (Signed)
[place] @ [facility] [address] [place], [place], [address] Phone: [phone]   Fax:  [phone]  Physical Therapy Evaluation  Patient Details  Name: [name] MRN: [id number] Date of Birth: 01/12/1950 Referring Provider (PT): Dr. [name]   Encounter Date: 10/29/2020   PT End of Session - 10/29/20 1701     Visit Number 1    Number of Visits 2    Date for PT Re-Evaluation 12/24/20    PT Start Time 7371    PT Stop Time 1554    PT Time Calculation (min) 31 min    Activity Tolerance Patient tolerated treatment well    Behavior During Therapy [facility] for tasks assessed/performed             Past Medical History:  Diagnosis Date   Anxiety    takes Xanax daily    Arthritis    Breast cancer ([place])    Depression    takes Pristiq and Wellbutrin daily   GERD (gastroesophageal reflux disease)    Joint pain    Joint swelling    Nocturia    Seasonal allergies    Takes Zyrtec nightly along with Nasal Spray   Vitamin D deficiency    new script for Vit D to start 08/22/15    Past Surgical History:  Procedure Laterality Date   blephorplasty Bilateral    COLONOSCOPY     left leg surgery     rod   TOTAL KNEE ARTHROPLASTY Right 09/01/2015   TOTAL KNEE ARTHROPLASTY Right 09/01/2015   Procedure: TOTAL KNEE ARTHROPLASTY;  Surgeon: Frederik Pear, MD;  Location: [place];  Service: Orthopedics;  Laterality: Right;    There were no vitals filed for this visit.    Subjective Assessment - 10/29/20 1655     Subjective Patient reports she is here today to be seen by her medical team for her newly diagnosed right breast cancer.    Patient is accompained by: Family member    Pertinent History Patient was diagnosed on 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 It measures 3.2 cm and is located in the lower innre quadrant. It is ER/PR positive and HER2 negative with a Ki67 of 5%.    Patient Stated Goals Reduce  lymphedema risk and learn post op HEP    Currently in Pain? No/denies                [facility] PT Assessment - 10/29/20 0001       Assessment   Medical Diagnosis Right breast cancer    Referring Provider (PT) Dr. [name]    Onset Date/Surgical Date 10/10/20    Hand Dominance Right    Prior Therapy none      Precautions   Precautions Other (comment)    Precaution Comments active cancer      Restrictions   Weight Bearing Restrictions No      Balance Screen   Has the patient fallen in the past 6 months No    Has the patient had a decrease in activity level because of a fear of falling?  No    Is the patient reluctant to leave their home because of a fear of falling?  No      Home Social worker Private residence    Living Arrangements Alone    Available Help at Discharge Family      Prior Function   Level of [place]  Retired    Leisure She does not exercise      Cognition   Overall Cognitive Status Within Functional Limits for tasks assessed      Posture/Postural Control   Posture/Postural Control Postural limitations    Postural Limitations Rounded Shoulders;Forward head      ROM / Strength   AROM / PROM / Strength AROM;Strength      AROM   Overall AROM Comments Left cervical rotation and right sidebending limited 25%; others are WNL    AROM Assessment Site Shoulder    Right/Left Shoulder Right;Left    Right Shoulder Extension 52 Degrees    Right Shoulder Flexion 151 Degrees    Right Shoulder ABduction 162 Degrees    Right Shoulder Internal Rotation 62 Degrees    Right Shoulder External Rotation 88 Degrees    Left Shoulder Extension 50 Degrees    Left Shoulder Flexion 149 Degrees    Left Shoulder ABduction 145 Degrees    Left Shoulder Internal Rotation 67 Degrees    Left Shoulder External Rotation 75 Degrees      Strength   Overall Strength Within functional limits for tasks performed                LYMPHEDEMA/ONCOLOGY QUESTIONNAIRE - 10/29/20 0001       Type   Cancer Type Right breast cancer      Lymphedema Assessments   Lymphedema Assessments Upper extremities      Right Upper Extremity Lymphedema   10 cm Proximal to Olecranon Process 25.8 cm    Olecranon Process 22.5 cm    10 cm Proximal to Ulnar Styloid Process 19.3 cm    Just Proximal to Ulnar Styloid Process 14.2 cm    Across Hand at [employer] 18.2 cm    At [place] of 2nd Digit 6.4 cm      Left Upper Extremity Lymphedema   10 cm Proximal to Olecranon Process 27.4 cm    Olecranon Process 23.3 cm    10 cm Proximal to Ulnar Styloid Process 18.4 cm    Just Proximal to Ulnar Styloid Process 14 cm    Across Hand at [employer] 18.5 cm    At [place] of 2nd Digit 6.1 cm             L-DEX FLOWSHEETS - 10/29/20 1700       L-DEX LYMPHEDEMA SCREENING   Measurement Type Unilateral    L-DEX MEASUREMENT EXTREMITY Upper Extremity    POSITION  Standing    DOMINANT SIDE Right    At Risk Side Right    BASELINE SCORE (UNILATERAL) 0.7              The patient was assessed using the L-Dex machine today to produce a lymphedema index baseline score. The patient will be reassessed on a regular basis (typically every 3 months) to obtain new L-Dex scores. If the score is > 6.5 points away from his/her baseline score indicating onset of subclinical lymphedema, it will be recommended to wear a compression garment for 4 weeks, 12 hours per day and then be reassessed. If the score continues to be > 6.5 points from baseline at reassessment, we will initiate lymphedema treatment. Assessing in this manner has a 95% rate of preventing clinically significant lymphedema.     Katina Dung - 10/29/20 0001     Open a tight or new jar Mild difficulty    Do heavy household chores (wash walls, wash floors) Mild difficulty  Carry a shopping bag or briefcase No difficulty    Wash your back No difficulty    Use a knife to cut food  No difficulty    Recreational activities in which you take some force or impact through your arm, shoulder, or hand (golf, hammering, tennis) Mild difficulty    During the past week, to what extent has your arm, shoulder or hand problem interfered with your normal social activities with family, friends, neighbors, or groups? Not at all    During the past week, to what extent has your arm, shoulder or hand problem limited your work or other regular daily activities Not at all    Arm, shoulder, or hand pain. None    Tingling (pins and needles) in your arm, shoulder, or hand None    Difficulty Sleeping No difficulty    DASH Score 6.82 %              Objective measurements completed on examination: See above findings.       Patient was instructed today in a home exercise program today for post op shoulder range of motion. These included active assist shoulder flexion in sitting, scapular retraction, wall walking with shoulder abduction, and hands behind head external rotation.  She was encouraged to do these twice a day, holding 3 seconds and repeating 5 times when permitted by her physician.           PT Education - 10/29/20 1700     Education Details Lymphedema education and post op HEP    Person(s) Educated Patient;Other (comment)   sister   Methods Explanation;Demonstration;Handout    Comprehension Returned demonstration;Verbalized understanding                 PT Long Term Goals - 10/29/20 1705       PT LONG TERM GOAL #1   Title Patient will dmeonstrate she has regained full shoulder ROM and function post operatively compared to baelines.    Time 8    Period Weeks    Status New    Target Date 12/24/20             Breast Clinic Goals - 10/29/20 1705       Patient will be able to verbalize understanding of pertinent lymphedema risk reduction practices relevant to her diagnosis specifically related to skin care.   Time 1    Period Days    Status  Achieved      Patient will be able to return demonstrate and/or verbalize understanding of the post-op home exercise program related to regaining shoulder range of motion.   Time 1    Period Days    Status Achieved      Patient will be able to verbalize understanding of the importance of attending the postoperative After Breast Cancer Class for further lymphedema risk reduction education and therapeutic exercise.   Time 1    Period Days    Status Achieved                   Plan - 10/29/20 1701     Clinical Impression Statement Patient was diagnosed on 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 It measures 3.2 cm and is located in the lower innre quadrant. It is ER/PR positive and HER2 negative with a Ki67 of 5%. Her multidiscipinary medical team met prior to her assessments to determine a recommended treatment plan. She is planning to have a right lumpectomy and sentinel node biopsy followed  by Oncotype testing, radiation, and anti-estrogen therapy. She will benefit from a post op PT reassessment to determine needs and from L-Dex screens every 3 months for 2 years to detect subclinical lymphedema.    Stability/Clinical Decision Making Stable/Uncomplicated    Clinical Decision Making Low    Rehab Potential Excellent    PT Frequency --   Eval and 1 f/u visit   PT Treatment/Interventions ADLs/Self Care Home Management;Therapeutic exercise;Patient/family education    PT Next Visit Plan Will reassess 3-4 weeks post op    PT Home Exercise Plan Post op shoulder HEP    Consulted and Agree with Plan of Care Patient             Patient will benefit from skilled therapeutic intervention in order to improve the following deficits and impairments:  Postural dysfunction, Decreased range of motion, Decreased knowledge of precautions, Impaired UE functional use, Pain  Visit Diagnosis: Malignant neoplasm of lower-inner quadrant of right breast of female, estrogen  receptor positive ([place]) - Plan: PT plan of care cert/re-cert  Abnormal posture - Plan: PT plan of care cert/re-cert  Patient will follow up at outpatient cancer rehab 3-4 weeks following surgery.  If the patient requires physical therapy at that time, a specific plan will be dictated and sent to the referring physician for approval. The patient was educated today on appropriate basic range of motion exercises to begin post operatively and the importance of attending the After Breast Cancer class following surgery.  Patient was educated today on lymphedema risk reduction practices as it pertains to recommendations that will benefit the patient immediately following surgery.  She verbalized good understanding.      Problem List Patient Active Problem List   Diagnosis Date Noted   Malignant neoplasm of lower-inner quadrant of right breast of female, estrogen receptor positive ([place]) 10/24/2020   Anxiety 10/28/2016   Allergic rhinitis 10/28/2016   HTN (hypertension) 10/28/2016   Constipation 10/28/2016   Insomnia 10/28/2016   Vitamin D deficiency 10/28/2016   Hyponatremia 10/26/2016   Primary osteoarthritis of right knee 08/28/2015   Annia Friendly, PT 10/29/20 5:07 PM   [place] @ [address] [place], [place], [address] Phone: [phone]   Fax:  [phone]  Name: Zuleyma Scharf MRN: [id number] Date of Birth: 02/28/1950

## 2020-10-29 NOTE — Progress Notes (Signed)
[place]   [phone] Fax:([phone]) [place] Note   Patient Care Team: [name], PA-C as PCP - General (Family Medicine) Rockwell Germany, RN as Oncology Nurse Navigator Mauro Kaufmann, RN as Oncology Nurse Navigator  Date of Service:  10/29/2020   CHIEF COMPLAINTS/PURPOSE OF CONSULTATION:  Right Breast Cancer, ER+  REFERRING PHYSICIAN:  [facility] (Dr. Truman Hayward)   ASSESSMENT & PLAN:  [name] is a 70 y.o. postmenopausal female with a history of   1. Malignant neoplasm of lower-inner quadrant of right breast, invasive lobular carcinoma, Stage IA, c(T1b, N0), ER+/PR+/HER2-, Grade 2  -found on screening MM. Right diagnostic and [place] on 10/08/20 showed three small masses in lower-inner breast. Biopsy 10/16/20 confirmed invasive and in situ lobular carcinoma. --We discussed her imaging findings and the biopsy results in great details. -Given her early stage disease, she is a candidate for lumpectomy and SLN biopsy. She is agreeable with that. She was seen by Dr. Donne Hazel today and likely will proceed with surgery soon.  -I recommend a Oncotype Dx test on the surgical sample if tumor >1cm or 5-51m with Grade 2/3. We'll make a decision about adjuvant chemotherapy based on the Oncotype result. Written material of this test was given to her. She has good baseline health, would be a good candidate for chemotherapy if her Oncotype recurrence score is high, although I suspect this is likely low risk disease.  -If her surgical sentinel lymph node node positive, I recommend mammaprint for further risk stratification and guide adjuvant chemotherapy. -Giving the strong ER and PR expression in her postmenopausal status, I recommend adjuvant endocrine therapy with aromatase inhibitor for a total of 5-10 years to reduce the risk of cancer recurrence. Given the lobular histology, I will likely do 10 years if she tolerates well. Potential benefits and side  effects were discussed with patient and she is interested. -She was also seen by radiation oncologist Dr. KSondra Cometoday. If her surgical sentinel lymph nodes were negative, she would not need post mastectomy radiation.  -We also discussed the breast cancer surveillance after her surgery. She will continue annual screening mammogram, self exam, and a routine office visit with lab and exam with [place] -I encouraged her to have healthy diet and exercise regularly.   2. Bone Health  -Her most recent DEXA was in 2017 per patient. We will obtain a new one prior to starting antiestrogen therapy.   PLAN:  -she will proceed with lumpectomy under Dr. WDonne Hazelsoon. -Oncotype if tumor >1cm or 5-113mwith Grade 2/3, or Mammaprint if node (+) -I'll see her after radiation, or sooner if needed     Oncology History Overview Note  Cancer Staging Malignant neoplasm of lower-inner quadrant of right breast of female, estrogen receptor positive ([place]aging form: Breast, AJCC 8th Edition - Clinical stage from 10/16/2020: Stage IA (cT1a, cN0, cM0, G2, ER+, PR+, HER2-) - Signed by FeTruitt MerleMD on 10/28/2020    Malignant neoplasm of lower-inner quadrant of right breast of female, estrogen receptor positive ([place] [place] 10/08/2020 Mammogram   Right Diagnostic MM and Right Breast [place]-Three irregular masses in lower-inner right breast:   -8 mm at 5-6 o'clock, 4 mm by [place] -4 mm at 5 o'clock in anterior depth, 2 mm by [place] -5 mm at 5 o'clock middle depth, 3 mm by USKoreaStable oval mass in right breast at 4 o'clock is benign -US[place]ore[place] right axilla demonstrates  mildly prominent right axillary lymph nodes but no definite lymphadenopathy.    10/16/2020 Cancer Staging   Staging form: Breast, AJCC 8th Edition - Clinical stage from 10/16/2020: Stage IA (cT1a, cN0, cM0, G2, ER+, PR+, HER2-) - Signed by Truitt Merle, MD on 10/28/2020 Stage prefix: Initial diagnosis Histologic grading system: 3 grade system    10/16/2020  Pathology Results   Diagnosis 1. Breast, right, needle core biopsy, 6 o'clock mass - INVASIVE MAMMARY CARCINOMA. SEE NOTE - MAMMARY CARCINOMA IN SITU 2. Breast, right, needle core biopsy, 4 o'clock mass - INVASIVE MAMMARY CARCINOMA. SEE NOTE 3. Breast, right, needle core biopsy, 5 o'clock mass - INVASIVE MAMMARY CARCINOMA. SEE NOTE - MAMMARY CARCINOMA IN SITU Diagnosis Note 1. , 2 and 3. Carcinoma measures 0.5 cm in part 1, 0.4 cm in part 2 and 0.2 cm in part 3; and appears grade 2.  ADDENDUM: 1. ,2 and 3. Immunohistochemical stain for E-cadherin is negative in the tumor cells, consistent with a lobular phenotype.  1. PROGNOSTIC INDICATORS Results: The tumor cells are NEGATIVE for Her2 (1+). Estrogen Receptor: 100%, POSITIVE, STRONG STAINING INTENSITY Progesterone Receptor: 50%, POSITIVE, MODERATE STAINING INTENSITY Proliferation Marker Ki67: 5%   10/24/2020 Initial Diagnosis   Malignant neoplasm of lower-inner quadrant of right breast of female, estrogen receptor positive ([place])      HISTORY OF PRESENTING ILLNESS:  [name] 70 y.o. female is a here because of breast cancer. The patient was referred by [facility]. The patient presents to the clinic today accompanied by her sister.   She had routine screening mammography on 10/02/20 showing a possible abnormality in the right breast. She underwent right diagnostic mammography and right breast ultrasonography on 10/08/20 showing: three irregular masses in the right breast-- 8 mm at 5-6 o'clock, 4 mm at 5 o'clock anterior depth, and 5 mm at 5 o'clock middle depth; no definitive lymphadenopathy.  Biopsy on 10/16/20 showed: invasive and in situ mammary carcinoma, e-cadherin negative, grade 2. Prognostic indicators significant for: estrogen receptor, 100% positive and progesterone receptor, 50% positive. Proliferation marker Ki67 at 5%. HER2 negative.    Today the patient notes they felt/feeling prior/after... -she denies having felt the  lump prior to her mammogram. She notes she can feel a lump now, which is likely from the biopsy. -she reports no appetite since the diagnosis. She has increased anxiety from the diagnosis.   She has a PMHx of.... -anxiety; she has been seeing a psychiatrist since her husband passed away. -s/p right knee replacement -s/p left leg surgery, has a rod in place.   Socially... -she is retired Scientist, clinical (histocompatibility and immunogenetics). She notes she worked in several different areas. -her husband passed away ~12 years ago from primary [place]. His disease was advanced when he was diagnosed (brain tumor). He was treated by Dr. Benay Spice. -maternal grandmother with colon cancer in her 46's or 38's (and lived to her 37's). No other family history of cancer to their knowledge. -she has no children and has never been pregnant -she drinks [place] twice a day with occasional wine (usually only socially)   GYN HISTORY  Menarchal: ~70 years old LMP: ~70 years old Contraceptive: used from 20's - 50's HRT: used for several months in 2021 GP:0   REVIEW OF SYSTEMS:   Constitutional: Denies fevers, chills or abnormal night sweats Eyes: Denies blurriness of vision, double vision or watery eyes Ears, nose, mouth, throat, and face: Denies mucositis or sore throat Respiratory: Denies cough, dyspnea or wheezes Cardiovascular: Denies palpitation, chest discomfort or  lower extremity swelling Gastrointestinal:  Denies nausea, (+) heartburn, (+) occasional constipation, (+) frequent urination Skin: Denies abnormal skin rashes Lymphatics: Denies new lymphadenopathy or easy bruising Neurological:Denies numbness, tingling or new weaknesses Behavioral/Psych: (+) anxiety, (+) depression All other systems were reviewed with the patient and are negative.   MEDICAL HISTORY:  Past Medical History:  Diagnosis Date   Anxiety    takes Xanax daily    Arthritis    Breast cancer ([place])    Depression    takes Pristiq and Wellbutrin daily    GERD (gastroesophageal reflux disease)    Joint pain    Joint swelling    Nocturia    Seasonal allergies    Takes Zyrtec nightly along with Nasal Spray   Vitamin D deficiency    new script for Vit D to start 08/22/15    SURGICAL HISTORY: Past Surgical History:  Procedure Laterality Date   blephorplasty Bilateral    COLONOSCOPY     left leg surgery     rod   TOTAL KNEE ARTHROPLASTY Right 09/01/2015   TOTAL KNEE ARTHROPLASTY Right 09/01/2015   Procedure: TOTAL KNEE ARTHROPLASTY;  Surgeon: Frederik Pear, MD;  Location: [facility];  Service: Orthopedics;  Laterality: Right;    SOCIAL HISTORY: Social History   Socioeconomic History   Marital status: Widowed    Spouse name: Not on file   Number of children: 0   Years of education: Not on file   Highest education level: Not on file  Occupational History   Not on file  Tobacco Use   Smoking status: Never   Smokeless tobacco: Never  Substance and Sexual Activity   Alcohol use: Yes    Alcohol/week: 2.0 standard drinks    Types: 2 Glasses of wine per week    Comment: couple times a wk   Drug use: No   Sexual activity: Not on file  Other Topics Concern   Not on file  Social History Narrative   Not on file   Social Determinants of Health   Financial Resource Strain: Not on file  Food Insecurity: Not on file  Transportation Needs: Not on file  Physical Activity: Not on file  Stress: Not on file  Social Connections: Not on file  Intimate Partner Violence: Not on file    FAMILY HISTORY: Family History  Problem Relation Age of Onset   Cancer Maternal Grandmother 53       colon cancer   Other Neg Hx        hyponatremia    ALLERGIES:  is allergic to codeine.  MEDICATIONS:  Current Outpatient Medications  Medication Sig Dispense Refill   ALPRAZolam (XANAX) 0.5 MG tablet Take 0.5 mg by mouth 2 (two) times daily as needed for anxiety.     azelastine (ASTELIN) 0.1 % nasal spray Place 1 spray into both nostrils 2 (two) times  daily. Use in each nostril as directed     buPROPion (WELLBUTRIN XL) 150 MG 24 hr tablet Take 450 mg by mouth daily.      cetirizine (ZYRTEC) 10 MG tablet Take 10 mg by mouth at bedtime.      demeclocycline (DECLOMYCIN) 300 MG tablet Take 1 tablet (300 mg total) by mouth daily. 90 tablet 3   desvenlafaxine (PRISTIQ) 50 MG 24 hr tablet Take 50 mg by mouth daily.      lansoprazole (PREVACID) 15 MG capsule Take 15 mg by mouth daily. At 6AM     Melatonin 10 MG TABS Take 10 mg  by mouth at bedtime.     rosuvastatin (CRESTOR) 5 MG tablet Take 1 tablet (5 mg total) by mouth daily. 90 tablet 3   telmisartan (MICARDIS) 80 MG tablet Take 80 mg by mouth daily.     Vitamin D, Ergocalciferol, (DRISDOL) 50000 units CAPS capsule Take 2,000 Units by mouth daily.      zolpidem (AMBIEN) 10 MG tablet Take 10 mg by mouth at bedtime.     No current facility-administered medications for this visit.    PHYSICAL EXAMINATION: ECOG PERFORMANCE STATUS: 0 - Asymptomatic  Vitals:   10/29/20 1237  BP: (!) 143/80  Pulse: (!) 103  Resp: 19  Temp: (!) 97.5 F (36.4 C)  SpO2: 100%   Filed Weights   10/29/20 1237  Weight: 68.6 kg    GENERAL:alert, no distress and comfortable SKIN: skin color, texture, turgor are normal, no rashes or significant lesions EYES: normal, Conjunctiva are pink and non-injected, sclera clear  NECK: supple, thyroid normal size, non-tender, without nodularity LYMPH:  no palpable lymphadenopathy in the cervical, axillary  LUNGS: clear to auscultation and percussion with normal breathing effort HEART: regular rate & rhythm and no murmurs and no lower extremity edema ABDOMEN:abdomen soft, non-tender and normal bowel sounds Musculoskeletal:no cyanosis of digits and no clubbing  NEURO: alert & oriented x 3 with fluent speech, no focal motor/sensory deficits BREAST: 2 cm x 4 cm bruise at 4-5 o'clock position of right breast. No palpable mass, nodules or adenopathy bilaterally. Breast exam  benign.  LABORATORY DATA:  I have reviewed the data as listed CBC Latest Ref Rng & Units 10/29/2020 03/08/2020 09/03/2015  WBC 4.0 - 10.5 K/uL 6.2 10.9(H) 9.3  Hemoglobin 12.0 - 15.0 g/dL 13.6 12.0 10.0(L)  Hematocrit 36.0 - 46.0 % 39.9 35.2(L) 30.5(L)  Platelets 150 - 400 K/uL 352 382 264    CMP Latest Ref Rng & Units 10/29/2020 06/25/2020 03/08/2020  Glucose 70 - 99 mg/dL 54(L) 110(H) 125(H)  BUN 8 - 23 mg/dL _0 Creatinine 0.44 - 1.00 mg/dL 0.99 1.01 1.19(H)  Sodium 135 - 145 mmol/L 139 135 136  Potassium 3.5 - 5.1 mmol/L 4.1 4.2 3.9  Chloride 98 - 111 mmol/L 103 99 104  CO2 22 - 32 mmol/L _1 Calcium 8.9 - 10.3 mg/dL 10.0 9.8 9.3  Total Protein 6.5 - 8.1 g/dL 7.7 - 6.6  Total Bilirubin 0.3 - 1.2 mg/dL 0.6 - 0.6  Alkaline Phos 38 - 126 U/L 96 - 90  AST 15 - 41 U/L 19 - 19  ALT 0 - 44 U/L 12 - 14     RADIOGRAPHIC STUDIES: I have personally reviewed the radiological images as listed and agreed with the findings in the report. No results found.   Orders Placed This Encounter  Procedures   DG Bone Density    Standing Status:   Future    Standing Expiration Date:   10/29/2021    Scheduling Instructions:     [facility]    Order Specific Question:   Reason for Exam (SYMPTOM  OR DIAGNOSIS REQUIRED)    Answer:   screening    Order Specific Question:   Preferred imaging location?    Answer:   External     All questions were answered. The patient knows to call the clinic with any problems, questions or concerns. The total time spent in the appointment was 45 minutes.     Truitt Merle, MD 10/29/2020 6:32 PM  I, Wilburn Mylar, am acting  as scribe for Truitt Merle, MD.   I have reviewed the above documentation for accuracy and completeness, and I agree with the above.

## 2020-10-29 NOTE — Progress Notes (Signed)
[place] Psychosocial Distress Screening Spiritual Care  Met with [name] and her sister [name] in [facility] to introduce [place] team/resources, reviewing distress screen per protocol.  The patient scored a 10 on the Psychosocial Distress Thermometer which indicates severe distress. Also assessed for distress and other psychosocial needs.   ONCBCN DISTRESS SCREENING 10/29/2020  Screening Type Initial Screening  Distress experienced in past week (1-10) 10  Emotional problem type Depression;Nervousness/Anxiety;Adjusting to illness;Isolation/feeling alone;Boredom;Adjusting to appearance changes  Information Concerns Type Lack of info about diagnosis;Lack of info about treatment;Lack of info about complementary therapy choices  Physical Problem type Loss of appetitie;Constipation/diarrhea  Referral to support programs Yes   [name] husband Patrick Jupiter was diagnosed with a brain tumor 13 years ago and was treated here before he died 45 years ago. Understandably, coming to [facility] stirs up her grief. She reports that she has utilized several tools for coping and healing, from a hospice grief support group to seeing a psychiatrist regularly for check-ins and med management. She also notes that her distress is considerably lower after having met her team and learned the scope of her diagnosis and treatment.  Provided empathic listening, normalization of feelings, opportunity to process, affirmation of strengths, and emotional support. We talked about the concept of a "corrective experience" and the opportunity for additional healing as she moves through old and familiar feelings again.  [name] prefers to avoid computers, so we talked about which support programs are available by phone or in person at this time.  Follow up needed: No. [name] prefers to reach out as needed/desired and has my direct-dial number.   [employer], [place], [facility] Pager [phone] Voicemail  [phone]

## 2020-10-29 NOTE — Patient Instructions (Signed)

## 2020-10-30 ENCOUNTER — Encounter: Payer: Self-pay | Admitting: *Deleted

## 2020-10-30 ENCOUNTER — Telehealth: Payer: Self-pay | Admitting: *Deleted

## 2020-10-30 NOTE — Telephone Encounter (Signed)
Pt left vm requesting order for bone density to be sent to [facility]. Return pt call, left vm stating order has been sent to [facility] as well requesting a return call regarding [place] from 10.26.22 concerning dx or treatment care plan. Encourage pt to call with needs.

## 2020-10-31 DIAGNOSIS — C50311 Malignant neoplasm of lower-inner quadrant of right female breast: Secondary | ICD-10-CM | POA: Diagnosis not present

## 2020-10-31 DIAGNOSIS — Z170 Estrogen receptor positive status [ER+]: Secondary | ICD-10-CM | POA: Diagnosis not present

## 2020-11-03 ENCOUNTER — Other Ambulatory Visit: Payer: Self-pay | Admitting: General Surgery

## 2020-11-03 DIAGNOSIS — C50311 Malignant neoplasm of lower-inner quadrant of right female breast: Secondary | ICD-10-CM

## 2020-11-05 DIAGNOSIS — M85851 Other specified disorders of bone density and structure, right thigh: Secondary | ICD-10-CM | POA: Diagnosis not present

## 2020-11-05 DIAGNOSIS — Z780 Asymptomatic menopausal state: Secondary | ICD-10-CM | POA: Diagnosis not present

## 2020-11-05 DIAGNOSIS — M85852 Other specified disorders of bone density and structure, left thigh: Secondary | ICD-10-CM | POA: Diagnosis not present

## 2020-11-06 ENCOUNTER — Encounter: Payer: Self-pay | Admitting: *Deleted

## 2020-11-11 ENCOUNTER — Telehealth: Payer: Self-pay

## 2020-11-11 NOTE — Telephone Encounter (Signed)
Reviewed bone density exam results, per Dr.Feng pt to take 800-1000u of vitamin D and 600-1200mg  of Calcium daily OTC. Pt informed of both and has no further questions at this time.

## 2020-11-24 NOTE — Progress Notes (Signed)
Reviewed chart with Dr. Clide Cliff at [facility]. Okay to proceed with surgery as scheduled.

## 2020-11-25 ENCOUNTER — Other Ambulatory Visit: Payer: Self-pay

## 2020-11-25 ENCOUNTER — Encounter (HOSPITAL_BASED_OUTPATIENT_CLINIC_OR_DEPARTMENT_OTHER): Payer: Self-pay | Admitting: General Surgery

## 2020-12-02 ENCOUNTER — Encounter (HOSPITAL_BASED_OUTPATIENT_CLINIC_OR_DEPARTMENT_OTHER): Payer: Self-pay | Admitting: Plastic Surgery

## 2020-12-02 ENCOUNTER — Other Ambulatory Visit: Payer: Self-pay

## 2020-12-02 ENCOUNTER — Emergency Department (HOSPITAL_BASED_OUTPATIENT_CLINIC_OR_DEPARTMENT_OTHER)
Admission: EM | Admit: 2020-12-02 | Discharge: 2020-12-02 | Disposition: A | Discharge status: Home / Self Care | Payer: Medicare PPO | Attending: Emergency Medicine | Admitting: Emergency Medicine

## 2020-12-02 ENCOUNTER — Emergency Department (HOSPITAL_BASED_OUTPATIENT_CLINIC_OR_DEPARTMENT_OTHER): Payer: Medicare PPO

## 2020-12-02 ENCOUNTER — Encounter (HOSPITAL_BASED_OUTPATIENT_CLINIC_OR_DEPARTMENT_OTHER): Payer: Self-pay | Admitting: Emergency Medicine

## 2020-12-02 DIAGNOSIS — R911 Solitary pulmonary nodule: Secondary | ICD-10-CM | POA: Diagnosis not present

## 2020-12-02 DIAGNOSIS — Z853 Personal history of malignant neoplasm of breast: Secondary | ICD-10-CM | POA: Insufficient documentation

## 2020-12-02 DIAGNOSIS — S0993XA Unspecified injury of face, initial encounter: Secondary | ICD-10-CM | POA: Diagnosis not present

## 2020-12-02 DIAGNOSIS — R918 Other nonspecific abnormal finding of lung field: Secondary | ICD-10-CM

## 2020-12-02 DIAGNOSIS — Z96651 Presence of right artificial knee joint: Secondary | ICD-10-CM | POA: Diagnosis not present

## 2020-12-02 DIAGNOSIS — Y92009 Unspecified place in unspecified non-institutional (private) residence as the place of occurrence of the external cause: Secondary | ICD-10-CM | POA: Diagnosis not present

## 2020-12-02 DIAGNOSIS — I10 Essential (primary) hypertension: Secondary | ICD-10-CM | POA: Diagnosis not present

## 2020-12-02 DIAGNOSIS — S0181XA Laceration without foreign body of other part of head, initial encounter: Secondary | ICD-10-CM

## 2020-12-02 DIAGNOSIS — Z043 Encounter for examination and observation following other accident: Secondary | ICD-10-CM | POA: Diagnosis not present

## 2020-12-02 DIAGNOSIS — W19XXXA Unspecified fall, initial encounter: Secondary | ICD-10-CM

## 2020-12-02 DIAGNOSIS — W01198A Fall on same level from slipping, tripping and stumbling with subsequent striking against other object, initial encounter: Secondary | ICD-10-CM | POA: Insufficient documentation

## 2020-12-02 DIAGNOSIS — Z79899 Other long term (current) drug therapy: Secondary | ICD-10-CM | POA: Diagnosis not present

## 2020-12-02 DIAGNOSIS — M47812 Spondylosis without myelopathy or radiculopathy, cervical region: Secondary | ICD-10-CM | POA: Diagnosis not present

## 2020-12-02 DIAGNOSIS — S01111A Laceration without foreign body of right eyelid and periocular area, initial encounter: Secondary | ICD-10-CM | POA: Diagnosis not present

## 2020-12-02 DIAGNOSIS — S0083XA Contusion of other part of head, initial encounter: Secondary | ICD-10-CM | POA: Diagnosis not present

## 2020-12-02 DIAGNOSIS — C50311 Malignant neoplasm of lower-inner quadrant of right female breast: Secondary | ICD-10-CM | POA: Diagnosis not present

## 2020-12-02 DIAGNOSIS — R220 Localized swelling, mass and lump, head: Secondary | ICD-10-CM | POA: Diagnosis not present

## 2020-12-02 DIAGNOSIS — S0990XA Unspecified injury of head, initial encounter: Secondary | ICD-10-CM | POA: Diagnosis not present

## 2020-12-02 DIAGNOSIS — S0591XA Unspecified injury of right eye and orbit, initial encounter: Secondary | ICD-10-CM | POA: Diagnosis present

## 2020-12-02 MED ORDER — LIDOCAINE-EPINEPHRINE (PF) 2 %-1:200000 IJ SOLN: 10.0000 mL | Freq: Once | INTRAMUSCULAR | Status: AC

## 2020-12-02 MED ORDER — LIDOCAINE-EPINEPHRINE (PF) 2 %-1:200000 IJ SOLN
INTRAMUSCULAR | Status: AC
  Filled 2020-12-02: qty 20

## 2020-12-02 NOTE — ED Provider Notes (Signed)
[facility] EMERGENCY DEPT Provider Note   CSN: [id number] Arrival date & time: 12/02/20  1536     History Chief Complaint  Patient presents with   [name] is a 70 y.o. female.  Patient status post fall earlier today.  Mechanical fall was a reviewing her new house is being built.  And stumbled over some Strawder landed on the concrete garage floor.  No loss of consciousness.  Sustained a laceration to the right lateral eyebrow area.  Patient not on any blood thinners.  Denies any injuries to upper extremities or lower extremities.  No back pain.  Patient had tetanus updated just in September.      Past Medical History:  Diagnosis Date   Anxiety    takes Xanax daily    Arthritis    Breast cancer ([place])    Depression    takes Pristiq and Wellbutrin daily   GERD (gastroesophageal reflux disease)    Joint pain    Joint swelling    Nocturia    Seasonal allergies    Takes Zyrtec nightly along with Nasal Spray   Vitamin D deficiency    new script for Vit D to start 08/22/15    Patient Active Problem List   Diagnosis Date Noted   Malignant neoplasm of lower-inner quadrant of right breast of female, estrogen receptor positive ([place]) 10/24/2020   Anxiety 10/28/2016   Allergic rhinitis 10/28/2016   HTN (hypertension) 10/28/2016   Constipation 10/28/2016   Insomnia 10/28/2016   Vitamin D deficiency 10/28/2016   Hyponatremia 10/26/2016   Primary osteoarthritis of right knee 08/28/2015    Past Surgical History:  Procedure Laterality Date   blephorplasty Bilateral    COLONOSCOPY     left leg surgery     rod   TOTAL KNEE ARTHROPLASTY Right 09/01/2015   TOTAL KNEE ARTHROPLASTY Right 09/01/2015   Procedure: TOTAL KNEE ARTHROPLASTY;  Surgeon: Frederik Pear, MD;  Location: [facility];  Service: Orthopedics;  Laterality: Right;     OB History   No obstetric history on file.     Family History  Problem Relation Age of Onset   Cancer Maternal Grandmother 62        colon cancer   Other Neg Hx        hyponatremia    Social History   Tobacco Use   Smoking status: Never   Smokeless tobacco: Never  Substance Use Topics   Alcohol use: Yes    Alcohol/week: 2.0 standard drinks    Types: 2 Glasses of wine per week    Comment: couple times a wk   Drug use: No    Home Medications Prior to Admission medications   Medication Sig Start Date End Date Taking? Authorizing Provider  ALPRAZolam Duanne Moron) 0.5 MG tablet Take 0.5 mg by mouth 2 (two) times daily as needed for anxiety.    [provider]  azelastine (ASTELIN) 0.1 % nasal spray Place 2 sprays into both nostrils 2 (two) times daily. Use in each nostril as directed    [provider]  buPROPion (WELLBUTRIN XL) 150 MG 24 hr tablet Take 450 mg by mouth daily.     [provider]  cetirizine (ZYRTEC) 10 MG tablet Take 10 mg by mouth at bedtime.     [provider]  demeclocycline (DECLOMYCIN) 300 MG tablet Take 1 tablet (300 mg total) by mouth daily. 06/25/20   Renato Shin, MD  desvenlafaxine (PRISTIQ) 50 MG 24 hr tablet Take  50 mg by mouth daily.     [provider]  lansoprazole (PREVACID) 15 MG capsule Take 15 mg by mouth daily. At 6AM    [provider]  Melatonin 10 MG TABS Take 10 mg by mouth at bedtime.    [provider]  rosuvastatin (CRESTOR) 5 MG tablet Take 1 tablet (5 mg total) by mouth daily. 04/16/20 11/25/20  Pixie Casino, MD  telmisartan (MICARDIS) 80 MG tablet Take 80 mg by mouth daily. 07/26/18   [provider]  Vitamin D, Ergocalciferol, (DRISDOL) 50000 units CAPS capsule Take 2,000 Units by mouth daily.     [provider]  zolpidem (AMBIEN) 10 MG tablet Take 10 mg by mouth at bedtime.    [provider]    Allergies    Codeine and Lisinopril  Review of Systems   Review of Systems  Constitutional:  Negative for chills and fever.  HENT:  Negative for ear pain and sore throat.    Eyes:  Negative for pain and visual disturbance.  Respiratory:  Negative for cough and shortness of breath.   Cardiovascular:  Negative for chest pain and palpitations.  Gastrointestinal:  Negative for abdominal pain and vomiting.  Genitourinary:  Negative for dysuria and hematuria.  Musculoskeletal:  Negative for arthralgias and back pain.  Skin:  Positive for wound. Negative for color change and rash.  Neurological:  Negative for seizures and syncope.  All other systems reviewed and are negative.  Physical Exam Updated Vital Signs BP (!) 167/101   Pulse 81   Temp 98.6 F (37 C) (Oral)   Resp 20   Ht 1.6 m (5\' 3" )   Wt 68 kg   LMP  (LMP Unknown)   SpO2 100%   BMI 26.57 kg/m   Physical Exam Vitals and nursing note reviewed.  Constitutional:      General: She is not in acute distress.    Appearance: Normal appearance. She is well-developed.  HENT:     Head: Normocephalic.     Comments: Patient with a 3 cm laceration along the skin lines following the eyebrow out laterally.  This is on the right side.  There is some ecchymosis below the right eye.  There is also ecchymosis under the chin with a superficial abrasion.    Mouth/Throat:     Mouth: Mucous membranes are moist.  Eyes:     Extraocular Movements: Extraocular movements intact.     Conjunctiva/sclera: Conjunctivae normal.     Pupils: Pupils are equal, round, and reactive to light.  Cardiovascular:     Rate and Rhythm: Normal rate and regular rhythm.     Heart sounds: No murmur heard. Pulmonary:     Effort: Pulmonary effort is normal. No respiratory distress.     Breath sounds: Normal breath sounds.  Abdominal:     Palpations: Abdomen is soft.     Tenderness: There is no abdominal tenderness.  Musculoskeletal:        General: No swelling.     Cervical back: Normal range of motion and neck supple. No rigidity or tenderness.  Skin:    General: Skin is warm and dry.     Capillary Refill: Capillary refill takes  less than 2 seconds.  Neurological:     General: No focal deficit present.     Mental Status: She is alert and oriented to person, place, and time.     Cranial Nerves: No cranial nerve deficit.     Sensory: No sensory  deficit.     Motor: No weakness.  Psychiatric:        Mood and Affect: Mood normal.    ED Results / Procedures / Treatments   Labs (all labs ordered are listed, but only abnormal results are displayed) Labs Reviewed - No data to display  EKG None  Radiology CT Head Wo Contrast  Result Date: 12/02/2020 CLINICAL DATA:  Facial trauma.  Fall. EXAM: CT HEAD WITHOUT CONTRAST CT MAXILLOFACIAL WITHOUT CONTRAST CT CERVICAL SPINE WITHOUT CONTRAST TECHNIQUE: Multidetector CT imaging of the head, cervical spine, and maxillofacial structures were performed using the standard protocol without intravenous contrast. Multiplanar CT image reconstructions of the cervical spine and maxillofacial structures were also generated. COMPARISON:  CT head 03/08/2020. FINDINGS: CT HEAD FINDINGS Brain: No evidence of acute infarction, hemorrhage, hydrocephalus, extra-axial collection or mass lesion/mass effect. Again seen is mild diffuse atrophy and mild periventricular white matter hypodensity, likely chronic small vessel ischemic change. Vascular: Atherosclerotic calcifications are present within the cavernous internal carotid arteries. Skull: Normal. Negative for fracture or focal lesion. Other: None. CT MAXILLOFACIAL FINDINGS Osseous: No fracture or mandibular dislocation. No destructive process. Orbits: There is right periorbital soft tissue swelling. Globes are intact bilaterally. Postseptal orbital soft tissues are within normal limits. Sinuses: Partial right mastoid effusion appears unchanged. There is mucosal thickening of the right sphenoid sinus. No air-fluid levels are seen. Soft tissues: There is soft tissue swelling overlying the right anterior mandible. There is no foreign body. CT CERVICAL  SPINE FINDINGS Alignment: There is straightening of normal cervical lordosis. There is trace anterolisthesis at C2-C3. Skull base and vertebrae: There is no acute fracture or dislocation. No focal osseous lesions are identified. Soft tissues and spinal canal: No prevertebral fluid or swelling. No visible canal hematoma. Disc levels: There is moderate disc space narrowing with endplate osteophyte formation throughout the cervical spine compatible with degenerative change. There also degenerative changes of mid and upper facet joints bilaterally. There is mild central canal stenosis at C3-C4 and C4-C5 secondary to posterior disc osteophyte complexes. There are scattered neural foramina mild-to-moderate narrowing, left greater than right, secondary to facet arthropathy. Upper chest: There are nodular densities in the right lung apex measuring up to 3 mm. Other: None. IMPRESSION: 1. No acute intracranial process. 2. No acute facial fracture. 3. No acute fracture or traumatic subluxation of the cervical spine. 4. Mild diffuse atrophy and mild chronic small vessel ischemic change. 5. Stable right mastoid effusion. 6. Moderate multilevel degenerative changes of the cervical spine. 7. Multiple pulmonary nodules. Most severe: 3 mm right solid pulmonary nodule within the upper lobe. If patient is low risk for malignancy, no routine follow-up imaging is recommended; if patient is high risk for malignancy, a non-contrast Chest CT at 12 months is optional. If performed and the nodule is stable at 12 months, no further follow-up is recommended. These guidelines do not apply to immunocompromised patients and patients with cancer. Follow up in patients with significant comorbidities as clinically warranted. For lung cancer screening, adhere to Lung-RADS guidelines. Reference: Radiology. 2017; 284(1):228-43. Electronically Signed   By: Ronney Asters M.D.   On: 12/02/2020 18:36   CT Cervical Spine Wo Contrast  Result Date:  12/02/2020 CLINICAL DATA:  Facial trauma.  Fall. EXAM: CT HEAD WITHOUT CONTRAST CT MAXILLOFACIAL WITHOUT CONTRAST CT CERVICAL SPINE WITHOUT CONTRAST TECHNIQUE: Multidetector CT imaging of the head, cervical spine, and maxillofacial structures were performed using the standard protocol without intravenous contrast. Multiplanar CT image reconstructions of the cervical spine  and maxillofacial structures were also generated. COMPARISON:  CT head 03/08/2020. FINDINGS: CT HEAD FINDINGS Brain: No evidence of acute infarction, hemorrhage, hydrocephalus, extra-axial collection or mass lesion/mass effect. Again seen is mild diffuse atrophy and mild periventricular white matter hypodensity, likely chronic small vessel ischemic change. Vascular: Atherosclerotic calcifications are present within the cavernous internal carotid arteries. Skull: Normal. Negative for fracture or focal lesion. Other: None. CT MAXILLOFACIAL FINDINGS Osseous: No fracture or mandibular dislocation. No destructive process. Orbits: There is right periorbital soft tissue swelling. Globes are intact bilaterally. Postseptal orbital soft tissues are within normal limits. Sinuses: Partial right mastoid effusion appears unchanged. There is mucosal thickening of the right sphenoid sinus. No air-fluid levels are seen. Soft tissues: There is soft tissue swelling overlying the right anterior mandible. There is no foreign body. CT CERVICAL SPINE FINDINGS Alignment: There is straightening of normal cervical lordosis. There is trace anterolisthesis at C2-C3. Skull base and vertebrae: There is no acute fracture or dislocation. No focal osseous lesions are identified. Soft tissues and spinal canal: No prevertebral fluid or swelling. No visible canal hematoma. Disc levels: There is moderate disc space narrowing with endplate osteophyte formation throughout the cervical spine compatible with degenerative change. There also degenerative changes of mid and upper facet  joints bilaterally. There is mild central canal stenosis at C3-C4 and C4-C5 secondary to posterior disc osteophyte complexes. There are scattered neural foramina mild-to-moderate narrowing, left greater than right, secondary to facet arthropathy. Upper chest: There are nodular densities in the right lung apex measuring up to 3 mm. Other: None. IMPRESSION: 1. No acute intracranial process. 2. No acute facial fracture. 3. No acute fracture or traumatic subluxation of the cervical spine. 4. Mild diffuse atrophy and mild chronic small vessel ischemic change. 5. Stable right mastoid effusion. 6. Moderate multilevel degenerative changes of the cervical spine. 7. Multiple pulmonary nodules. Most severe: 3 mm right solid pulmonary nodule within the upper lobe. If patient is low risk for malignancy, no routine follow-up imaging is recommended; if patient is high risk for malignancy, a non-contrast Chest CT at 12 months is optional. If performed and the nodule is stable at 12 months, no further follow-up is recommended. These guidelines do not apply to immunocompromised patients and patients with cancer. Follow up in patients with significant comorbidities as clinically warranted. For lung cancer screening, adhere to Lung-RADS guidelines. Reference: Radiology. 2017; 284(1):228-43. Electronically Signed   By: Ronney Asters M.D.   On: 12/02/2020 18:36   CT Maxillofacial Wo Contrast  Result Date: 12/02/2020 CLINICAL DATA:  Facial trauma.  Fall. EXAM: CT HEAD WITHOUT CONTRAST CT MAXILLOFACIAL WITHOUT CONTRAST CT CERVICAL SPINE WITHOUT CONTRAST TECHNIQUE: Multidetector CT imaging of the head, cervical spine, and maxillofacial structures were performed using the standard protocol without intravenous contrast. Multiplanar CT image reconstructions of the cervical spine and maxillofacial structures were also generated. COMPARISON:  CT head 03/08/2020. FINDINGS: CT HEAD FINDINGS Brain: No evidence of acute infarction, hemorrhage,  hydrocephalus, extra-axial collection or mass lesion/mass effect. Again seen is mild diffuse atrophy and mild periventricular white matter hypodensity, likely chronic small vessel ischemic change. Vascular: Atherosclerotic calcifications are present within the cavernous internal carotid arteries. Skull: Normal. Negative for fracture or focal lesion. Other: None. CT MAXILLOFACIAL FINDINGS Osseous: No fracture or mandibular dislocation. No destructive process. Orbits: There is right periorbital soft tissue swelling. Globes are intact bilaterally. Postseptal orbital soft tissues are within normal limits. Sinuses: Partial right mastoid effusion appears unchanged. There is mucosal thickening of the right sphenoid sinus.  No air-fluid levels are seen. Soft tissues: There is soft tissue swelling overlying the right anterior mandible. There is no foreign body. CT CERVICAL SPINE FINDINGS Alignment: There is straightening of normal cervical lordosis. There is trace anterolisthesis at C2-C3. Skull base and vertebrae: There is no acute fracture or dislocation. No focal osseous lesions are identified. Soft tissues and spinal canal: No prevertebral fluid or swelling. No visible canal hematoma. Disc levels: There is moderate disc space narrowing with endplate osteophyte formation throughout the cervical spine compatible with degenerative change. There also degenerative changes of mid and upper facet joints bilaterally. There is mild central canal stenosis at C3-C4 and C4-C5 secondary to posterior disc osteophyte complexes. There are scattered neural foramina mild-to-moderate narrowing, left greater than right, secondary to facet arthropathy. Upper chest: There are nodular densities in the right lung apex measuring up to 3 mm. Other: None. IMPRESSION: 1. No acute intracranial process. 2. No acute facial fracture. 3. No acute fracture or traumatic subluxation of the cervical spine. 4. Mild diffuse atrophy and mild chronic small  vessel ischemic change. 5. Stable right mastoid effusion. 6. Moderate multilevel degenerative changes of the cervical spine. 7. Multiple pulmonary nodules. Most severe: 3 mm right solid pulmonary nodule within the upper lobe. If patient is low risk for malignancy, no routine follow-up imaging is recommended; if patient is high risk for malignancy, a non-contrast Chest CT at 12 months is optional. If performed and the nodule is stable at 12 months, no further follow-up is recommended. These guidelines do not apply to immunocompromised patients and patients with cancer. Follow up in patients with significant comorbidities as clinically warranted. For lung cancer screening, adhere to Lung-RADS guidelines. Reference: Radiology. 2017; 284(1):228-43. Electronically Signed   By: Ronney Asters M.D.   On: 12/02/2020 18:36    Procedures .Marland KitchenLaceration Repair  Date/Time: 12/02/2020 11:28 PM Performed by: Fredia Sorrow, MD Authorized by: Fredia Sorrow, MD   Consent:    Consent obtained:  Verbal   Consent given by:  Patient   Risks discussed:  Infection, pain, poor cosmetic result and poor wound healing Universal protocol:    Procedure explained and questions answered to patient or proxy's satisfaction: yes     Relevant documents present and verified: yes     Imaging studies available: yes     Required blood products, implants, devices, and special equipment available: yes     Site/side marked: yes     Immediately prior to procedure, a time out was called: yes     Patient identity confirmed:  Verbally with patient Anesthesia:    Anesthesia method:  Local infiltration   Local anesthetic:  Lidocaine 2% WITH epi Laceration details:    Location:  Face   Face location:  R eyebrow   Length (cm):  3 Pre-procedure details:    Preparation:  Patient was prepped and draped in usual sterile fashion Exploration:    Limited defect created (wound extended): no     Hemostasis achieved with:  Epinephrine    Imaging outcome: foreign body not noted     Wound exploration: wound explored through full range of motion and entire depth of wound visualized     Wound extent: no foreign bodies/material noted     Contaminated: no   Treatment:    Area cleansed with:  Shur-Clens   Amount of cleaning:  Standard   Irrigation solution:  Sterile water   Irrigation method:  Syringe   Visualized foreign bodies/material removed: no     Debridement:  None   Undermining:  None   Scar revision: no   Skin repair:    Repair method:  Sutures   Suture size:  6-0   Suture material:  Prolene   Suture technique:  Simple interrupted Approximation:    Approximation:  Close Repair type:    Repair type:  Complex Post-procedure details:    Dressing:  Open (no dressing)   Procedure completion:  Tolerated well, no immediate complications   Medications Ordered in ED Medications  lidocaine-EPINEPHrine (XYLOCAINE W/EPI) 2 %-1:200000 (PF) injection 10 mL (has no administration in time range)    ED Course  I have reviewed the triage vital signs and the nursing notes.  Pertinent labs & imaging results that were available during my care of the patient were reviewed by me and considered in my medical decision making (see chart for details).    MDM Rules/Calculators/A&P                           Patient CT head neck and maxillofacial without any acute findings.  Laceration 3 cm will require suturing.  Patient's tetanus is up-to-date.  Incidental finding of some pulmonary nodules which will have the primary care doctor follow-up.   Final Clinical Impression(s) / ED Diagnoses Final diagnoses:  Fall, initial encounter  Facial contusion, initial encounter  Facial laceration, initial encounter    Rx / DC Orders ED Discharge Orders     None        Fredia Sorrow, MD 12/02/20 [phone]

## 2020-12-02 NOTE — Discharge Instructions (Addendum)
Suture removal in 5 to 7 days.  Keep wound dry for 24 hours.  Then can wash with soap and water.  Apply antibiotic ointment.  CT head neck and face without any acute injuries.  No brain injury no bony injuries.  ET did pick up some pulmonary nodules.  These can often be benign.  But when they see them they recommend that your primary care doctor follow these up.

## 2020-12-02 NOTE — ED Triage Notes (Signed)
Pt arrives pov, reports mechanical fall. Denies loc. ~ 3 cm Lac noted to Right brow, bleeding controlled. Bruising and swelling noted to Right eye. Pt denies neck pain, denies thinners

## 2020-12-02 NOTE — Progress Notes (Signed)

## 2020-12-03 ENCOUNTER — Encounter (HOSPITAL_BASED_OUTPATIENT_CLINIC_OR_DEPARTMENT_OTHER): Payer: Self-pay | Admitting: General Surgery

## 2020-12-03 ENCOUNTER — Other Ambulatory Visit: Payer: Self-pay

## 2020-12-03 ENCOUNTER — Ambulatory Visit (HOSPITAL_BASED_OUTPATIENT_CLINIC_OR_DEPARTMENT_OTHER): Payer: Medicare PPO | Admitting: Certified Registered"

## 2020-12-03 ENCOUNTER — Ambulatory Visit (HOSPITAL_BASED_OUTPATIENT_CLINIC_OR_DEPARTMENT_OTHER)
Admission: RE | Admit: 2020-12-03 | Discharge: 2020-12-03 | Disposition: A | Discharge status: Home / Self Care | Payer: Medicare PPO | Attending: General Surgery | Admitting: General Surgery

## 2020-12-03 ENCOUNTER — Encounter (HOSPITAL_BASED_OUTPATIENT_CLINIC_OR_DEPARTMENT_OTHER)
Admission: RE | Disposition: A | Discharge status: Home / Self Care | Payer: Self-pay | Source: Home / Self Care | Attending: General Surgery

## 2020-12-03 DIAGNOSIS — Z170 Estrogen receptor positive status [ER+]: Secondary | ICD-10-CM | POA: Diagnosis not present

## 2020-12-03 DIAGNOSIS — R897 Abnormal histological findings in specimens from other organs, systems and tissues: Secondary | ICD-10-CM | POA: Insufficient documentation

## 2020-12-03 DIAGNOSIS — C50311 Malignant neoplasm of lower-inner quadrant of right female breast: Secondary | ICD-10-CM | POA: Diagnosis not present

## 2020-12-03 DIAGNOSIS — G8918 Other acute postprocedural pain: Secondary | ICD-10-CM | POA: Diagnosis not present

## 2020-12-03 DIAGNOSIS — C50911 Malignant neoplasm of unspecified site of right female breast: Secondary | ICD-10-CM | POA: Diagnosis not present

## 2020-12-03 HISTORY — PX: BREAST LUMPECTOMY WITH RADIOACTIVE SEED AND SENTINEL LYMPH NODE BIOPSY: SHX6550

## 2020-12-03 SURGERY — BREAST LUMPECTOMY WITH RADIOACTIVE SEED AND SENTINEL LYMPH NODE BIOPSY
Anesthesia: General | Site: Breast | Laterality: Right

## 2020-12-03 MED ORDER — VANCOMYCIN HCL 500 MG IV SOLR
INTRAVENOUS | Status: AC
  Filled 2020-12-03: qty 20

## 2020-12-03 MED ORDER — LIDOCAINE HCL (CARDIAC) PF 100 MG/5ML IV SOSY
PREFILLED_SYRINGE | INTRAVENOUS | Status: DC | PRN
  Administered 2020-12-03: 30 mg via INTRAVENOUS

## 2020-12-03 MED ORDER — AMISULPRIDE (ANTIEMETIC) 5 MG/2ML IV SOLN: 10.0000 mg | Freq: Once | INTRAVENOUS | Status: DC | PRN

## 2020-12-03 MED ORDER — ONDANSETRON HCL 4 MG/2ML IJ SOLN
INTRAMUSCULAR | Status: DC | PRN
  Administered 2020-12-03: 4 mg via INTRAVENOUS

## 2020-12-03 MED ORDER — DEXAMETHASONE SODIUM PHOSPHATE 4 MG/ML IJ SOLN
INTRAMUSCULAR | Status: DC | PRN
  Administered 2020-12-03: 4 mg via INTRAVENOUS

## 2020-12-03 MED ORDER — CHLORHEXIDINE GLUCONATE CLOTH 2 % EX PADS: 6.0000 | MEDICATED_PAD | Freq: Once | CUTANEOUS | Status: DC

## 2020-12-03 MED ORDER — MAGTRACE LYMPHATIC TRACER
INTRAMUSCULAR | Status: DC | PRN
  Administered 2020-12-03: 2 mL via INTRAMUSCULAR

## 2020-12-03 MED ORDER — TRAMADOL HCL 50 MG PO TABS: 100.0000 mg | ORAL_TABLET | Freq: Four times a day (QID) | ORAL | 0 refills | Status: DC | PRN

## 2020-12-03 MED ORDER — BUPIVACAINE HCL (PF) 0.25 % IJ SOLN
INTRAMUSCULAR | Status: DC | PRN
  Administered 2020-12-03: 5 mL

## 2020-12-03 MED ORDER — LACTATED RINGERS IV SOLN: INTRAVENOUS | Status: DC

## 2020-12-03 MED ORDER — DEXMEDETOMIDINE (PRECEDEX) IN NS 20 MCG/5ML (4 MCG/ML) IV SYRINGE
PREFILLED_SYRINGE | INTRAVENOUS | Status: AC
  Filled 2020-12-03: qty 15

## 2020-12-03 MED ORDER — FENTANYL CITRATE (PF) 100 MCG/2ML IJ SOLN
100.0000 ug | Freq: Once | INTRAMUSCULAR | Status: AC
  Administered 2020-12-03: 100 ug via INTRAVENOUS

## 2020-12-03 MED ORDER — CEFAZOLIN SODIUM-DEXTROSE 2-4 GM/100ML-% IV SOLN
2.0000 g | INTRAVENOUS | Status: AC
  Administered 2020-12-03: 2 g via INTRAVENOUS

## 2020-12-03 MED ORDER — PHENYLEPHRINE 40 MCG/ML (10ML) SYRINGE FOR IV PUSH (FOR BLOOD PRESSURE SUPPORT)
PREFILLED_SYRINGE | INTRAVENOUS | Status: AC
  Filled 2020-12-03: qty 20

## 2020-12-03 MED ORDER — CEFAZOLIN SODIUM-DEXTROSE 2-4 GM/100ML-% IV SOLN
INTRAVENOUS | Status: AC
  Filled 2020-12-03: qty 100

## 2020-12-03 MED ORDER — FENTANYL CITRATE (PF) 100 MCG/2ML IJ SOLN
INTRAMUSCULAR | Status: AC
  Filled 2020-12-03: qty 2

## 2020-12-03 MED ORDER — GLYCOPYRROLATE PF 0.2 MG/ML IJ SOSY
PREFILLED_SYRINGE | INTRAMUSCULAR | Status: AC
  Filled 2020-12-03: qty 1

## 2020-12-03 MED ORDER — MIDAZOLAM HCL 2 MG/2ML IJ SOLN
2.0000 mg | Freq: Once | INTRAMUSCULAR | Status: AC
  Administered 2020-12-03: 2 mg via INTRAVENOUS

## 2020-12-03 MED ORDER — ACETAMINOPHEN 500 MG PO TABS
ORAL_TABLET | ORAL | Status: AC
  Filled 2020-12-03: qty 2

## 2020-12-03 MED ORDER — VANCOMYCIN HCL 1000 MG IV SOLR
INTRAVENOUS | Status: AC
  Filled 2020-12-03: qty 20

## 2020-12-03 MED ORDER — EPHEDRINE 5 MG/ML INJ
INTRAVENOUS | Status: AC
  Filled 2020-12-03: qty 5

## 2020-12-03 MED ORDER — EPHEDRINE 5 MG/ML INJ
INTRAVENOUS | Status: AC
  Filled 2020-12-03: qty 15

## 2020-12-03 MED ORDER — PROPOFOL 10 MG/ML IV BOLUS
INTRAVENOUS | Status: DC | PRN
  Administered 2020-12-03: 200 mg via INTRAVENOUS

## 2020-12-03 MED ORDER — FENTANYL CITRATE (PF) 100 MCG/2ML IJ SOLN: 25.0000 ug | INTRAMUSCULAR | Status: DC | PRN

## 2020-12-03 MED ORDER — PROPOFOL 500 MG/50ML IV EMUL
INTRAVENOUS | Status: DC | PRN
  Administered 2020-12-03: 25 ug/kg/min via INTRAVENOUS

## 2020-12-03 MED ORDER — FENTANYL CITRATE (PF) 100 MCG/2ML IJ SOLN
INTRAMUSCULAR | Status: DC | PRN
  Administered 2020-12-03: 50 ug via INTRAVENOUS

## 2020-12-03 MED ORDER — MIDAZOLAM HCL 2 MG/2ML IJ SOLN
INTRAMUSCULAR | Status: AC
  Filled 2020-12-03: qty 2

## 2020-12-03 MED ORDER — ENSURE PRE-SURGERY PO LIQD: 296.0000 mL | Freq: Once | ORAL | Status: DC

## 2020-12-03 MED ORDER — BUPIVACAINE-EPINEPHRINE (PF) 0.5% -1:200000 IJ SOLN
INTRAMUSCULAR | Status: DC | PRN
  Administered 2020-12-03: 30 mL

## 2020-12-03 MED ORDER — PHENYLEPHRINE 40 MCG/ML (10ML) SYRINGE FOR IV PUSH (FOR BLOOD PRESSURE SUPPORT)
PREFILLED_SYRINGE | INTRAVENOUS | Status: AC
  Filled 2020-12-03: qty 10

## 2020-12-03 MED ORDER — ACETAMINOPHEN 500 MG PO TABS: 1000.0000 mg | ORAL_TABLET | Freq: Once | ORAL | Status: DC

## 2020-12-03 MED ORDER — PHENYLEPHRINE HCL (PRESSORS) 10 MG/ML IV SOLN
INTRAVENOUS | Status: DC | PRN
  Administered 2020-12-03: 80 ug via INTRAVENOUS

## 2020-12-03 MED ORDER — ACETAMINOPHEN 500 MG PO TABS
1000.0000 mg | ORAL_TABLET | ORAL | Status: AC
  Administered 2020-12-03: 1000 mg via ORAL

## 2020-12-03 SURGICAL SUPPLY — 52 items
ADH SKN CLS APL DERMABOND .7 (GAUZE/BANDAGES/DRESSINGS) ×1
APL PRP STRL LF DISP 70% ISPRP (MISCELLANEOUS) ×1
APPLIER CLIP 9.375 MED OPEN (MISCELLANEOUS) ×2
APR CLP MED 9.3 20 MLT OPN (MISCELLANEOUS) ×1
BINDER BREAST LRG (GAUZE/BANDAGES/DRESSINGS) ×1 IMPLANT
BLADE SURG 15 STRL LF DISP TIS (BLADE) ×1 IMPLANT
BLADE SURG 15 STRL SS (BLADE) ×2
CANISTER SUCT 1200ML W/VALVE (MISCELLANEOUS) ×1 IMPLANT
CHLORAPREP W/TINT 26 (MISCELLANEOUS) ×2 IMPLANT
CLIP APPLIE 9.375 MED OPEN (MISCELLANEOUS) IMPLANT
CLIP TI WIDE RED SMALL 6 (CLIP) ×2 IMPLANT
COVER BACK TABLE 60X90IN (DRAPES) ×2 IMPLANT
COVER MAYO STAND STRL (DRAPES) ×2 IMPLANT
COVER PROBE W GEL 5X96 (DRAPES) ×2 IMPLANT
DERMABOND ADVANCED (GAUZE/BANDAGES/DRESSINGS) ×1
DERMABOND ADVANCED .7 DNX12 (GAUZE/BANDAGES/DRESSINGS) ×1 IMPLANT
DRAPE LAPAROSCOPIC ABDOMINAL (DRAPES) ×2 IMPLANT
DRAPE UTILITY XL STRL (DRAPES) ×2 IMPLANT
ELECT COATED BLADE 2.86 ST (ELECTRODE) ×2 IMPLANT
ELECT REM PT RETURN 9FT ADLT (ELECTROSURGICAL) ×2
ELECTRODE REM PT RTRN 9FT ADLT (ELECTROSURGICAL) ×1 IMPLANT
GLOVE SURG ENC MOIS LTX SZ7 (GLOVE) ×4 IMPLANT
GLOVE SURG POLYISO LF SZ6.5 (GLOVE) ×1 IMPLANT
GLOVE SURG POLYISO LF SZ7 (GLOVE) ×2 IMPLANT
GLOVE SURG UNDER POLY LF SZ7 (GLOVE) ×6 IMPLANT
GLOVE SURG UNDER POLY LF SZ7.5 (GLOVE) ×2 IMPLANT
GOWN STRL REUS W/ TWL LRG LVL3 (GOWN DISPOSABLE) ×2 IMPLANT
GOWN STRL REUS W/TWL LRG LVL3 (GOWN DISPOSABLE) ×8
KIT MARKER MARGIN INK (KITS) ×2 IMPLANT
NDL HYPO 25X1 1.5 SAFETY (NEEDLE) ×1 IMPLANT
NDL SAFETY ECLIPSE 18X1.5 (NEEDLE) IMPLANT
NEEDLE HYPO 18GX1.5 SHARP (NEEDLE) ×2
NEEDLE HYPO 25X1 1.5 SAFETY (NEEDLE) ×4 IMPLANT
NS IRRIG 1000ML POUR BTL (IV SOLUTION) ×2 IMPLANT
PACK BASIN DAY SURGERY FS (CUSTOM PROCEDURE TRAY) ×2 IMPLANT
PENCIL SMOKE EVACUATOR (MISCELLANEOUS) ×2 IMPLANT
SLEEVE SCD COMPRESS KNEE MED (STOCKING) ×2 IMPLANT
SPONGE T-LAP 4X18 ~~LOC~~+RFID (SPONGE) ×3 IMPLANT
STRIP CLOSURE SKIN 1/2X4 (GAUZE/BANDAGES/DRESSINGS) ×2 IMPLANT
SUT MNCRL AB 4-0 PS2 18 (SUTURE) ×2 IMPLANT
SUT MON AB 5-0 PS2 18 (SUTURE) ×2 IMPLANT
SUT SILK 2 0 SH (SUTURE) ×3 IMPLANT
SUT VIC AB 2-0 SH 27 (SUTURE) ×2
SUT VIC AB 2-0 SH 27XBRD (SUTURE) ×1 IMPLANT
SUT VIC AB 3-0 SH 27 (SUTURE) ×2
SUT VIC AB 3-0 SH 27X BRD (SUTURE) ×1 IMPLANT
SYR CONTROL 10ML LL (SYRINGE) ×3 IMPLANT
TOWEL GREEN STERILE FF (TOWEL DISPOSABLE) ×2 IMPLANT
TRACER MAGTRACE VIAL (MISCELLANEOUS) ×1 IMPLANT
TRAY FAXITRON CT DISP (TRAY / TRAY PROCEDURE) ×2 IMPLANT
TUBE CONNECTING 20X1/4 (TUBING) ×1 IMPLANT
YANKAUER SUCT BULB TIP NO VENT (SUCTIONS) ×1 IMPLANT

## 2020-12-03 NOTE — Op Note (Signed)
Preoperative diagnosis: Clinical stage I right breast cancer Postoperative diagnosis: Same as above Procedure: 1.  Right breast radioactive seed bracketed lumpectomy 2.  Injection of mag trace for sentinel lymph node identification 3.  Right deep axillary sentinel lymph node biopsy Surgeon: Dr. Serita Grammes Anesthesia: General with a pectoral block Specimens: 1.  Right breast tissue marked with paint containing seeds (3) and clips 2.  Additional superior, lateral, inferior, medial and posterior margins rigth breast marked short superior, long lateral, double deep 3.  Right axillary sentinel nodes. Complications: None Drains: None Special count was correct completion Decision recovery stable condition  Indications: 70 year old female who underwent a screening mammogram with the finding of an incidental breast several small masses. She has no family history. She was noted to have an 8 mm 5 mm and 4 mm irregular mass cholesterol located in the lower medial breast. Ultrasound correlate showed the same thing. All 3 were biopsied. The clips are basically altogether 3 x 2 x 1 cm area in total. 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 We elected to proceed with seed bracketed lumpectomy and sn biopsy followed by oncoplastic reduction.   Procedure: After informed consent was obtained she had seeds placed in the breast mass.  I had these mammograms in the operating room.  She underwent a pectoral block.  She was given antibiotics.  SCDs were in place.  A surgical timeout was then performed.  I then injected 2 cc of mag trace in the subareolar position.  She was then taken to the operating room.  She was placed under general anesthesia without complication.  She was prepped and draped in the standard sterile surgical fashion.  A surgical timeout was again performed. I made an elliptical incision  overlying the seeds where she had been marked by Dr Iran Planas.  I excised some skin as one of the seeds was very close to the skin.  I then used the neoprobe to excise the radioactive seeds and the surrounding tissue with an attempt to get a clear margin.  Mammogram confirmed removal of the clips and three seeds.  I elected to remove additional margins as above to hopefully have these been clear.  I then obtained hemostasis.  I closed this cavity down with 2 layers of 2-0 Vicryl suture.  I closed the skin with 3-0 Vicryl and 5-0 Monocryl.  Glue Steri-Strips were applied.   I made an incision in the axilla.  I dissected through the axillary fascia.  I was able to identify activity and a brown staining node and excised these.   There was no other palpable adenopathy.  There is no additional activity.  I then obtained hemostasis in the axilla.  I closed this with 2-0 Vicryl, 3-0 Vicryl, and 4 Monocryl.  Glue and Steri-Strips were applied.  She was then extubated and transferred to recovery stable condition.

## 2020-12-03 NOTE — Interval H&P Note (Signed)
History and Physical Interval Note:  12/03/2020 2:12 PM  [name]  has presented today for surgery, with the diagnosis of RIGHT BREAST CANCER.  The various methods of treatment have been discussed with the patient and family. After consideration of risks, benefits and other options for treatment, the patient has consented to  Procedure(s): RIGHT BREAST LUMPECTOMY WITH RADIOACTIVE SEED X2 AND AXILLARY SENTINEL LYMPH NODE BIOPSY (Right) as a surgical intervention.  The patient's history has been reviewed, patient examined, no change in status, stable for surgery.  I have reviewed the patient's chart and labs.  Questions were answered to the patient's satisfaction.     [name]

## 2020-12-03 NOTE — Progress Notes (Signed)
Assisted Dr. Annye Asa with right, ultrasound guided, pectoralis block. Side rails up, monitors on throughout procedure. See vital signs in flow sheet. Tolerated Procedure well.

## 2020-12-03 NOTE — Anesthesia Procedure Notes (Signed)
Anesthesia Regional Block: Pectoralis block   Pre-Anesthetic Checklist: , timeout performed,  Correct Patient, Correct Site, Correct Laterality,  Correct Procedure, Correct Position, site marked,  Risks and benefits discussed,  Surgical consent,  Pre-op evaluation,  At surgeon's request and post-op pain management  Laterality: Right  Prep: chloraprep       Needles:  Injection technique: Single-shot  Needle Type: Echogenic Needle     Needle Length: 9cm  Needle Gauge: 21     Additional Needles:   Procedures:,,,, ultrasound used (permanent image in chart),,    Narrative:  Start time: 12/03/2020 1:50 PM End time: 12/03/2020 1:58 PM Injection made incrementally with aspirations every 5 mL.  Performed by: Personally  Anesthesiologist: Annye Asa, MD  Additional Notes: Pt identified in Holding room.  Monitors applied. Working IV access confirmed. Sterile prep R clavicle and pec.  #21ga ECHOgenic Arrow block needle between ribs 4,5, pec major and pec minor with US guidance.  30cc 0.5% Bupivacaine with 1:200k epi injected incrementally after negative test dose, good spread of local in plane.  Patient asymptomatic, VSS, no heme aspirated, tolerated well.   Jenita Seashore, MD

## 2020-12-03 NOTE — Anesthesia Postprocedure Evaluation (Signed)
Anesthesia Post Note  Patient: [name]  Procedure(s) Performed: RIGHT BREAST LUMPECTOMY WITH RADIOACTIVE SEEDS X3 AND AXILLARY SENTINEL LYMPH NODE BIOPSY (Right: Breast)     Patient location during evaluation: PACU Anesthesia Type: General Level of consciousness: awake and alert, patient cooperative and oriented Pain management: pain level controlled Vital Signs Assessment: post-procedure vital signs reviewed and stable Respiratory status: spontaneous breathing, nonlabored ventilation and respiratory function stable Cardiovascular status: blood pressure returned to baseline and stable Postop Assessment: no apparent nausea or vomiting and able to ambulate Anesthetic complications: no   No notable events documented.  Last Vitals:  Vitals:   12/03/20 1600 12/03/20 1615  BP: (!) 112/94 131/71  Pulse: (!) 101 97  Resp: 17 (!) 9  Temp:    SpO2: 100% 100%    Last Pain:  Vitals:   12/03/20 1615  TempSrc:   PainSc: 0-No pain                 Jacai Kipp,E. Tomiko Schoon

## 2020-12-03 NOTE — Transfer of Care (Signed)
Immediate Anesthesia Transfer of Care Note  Patient: [name]  Procedure(s) Performed: RIGHT BREAST LUMPECTOMY WITH RADIOACTIVE SEEDS X3 AND AXILLARY SENTINEL LYMPH NODE BIOPSY (Right: Breast)  Patient Location: PACU  Anesthesia Type:GA combined with regional for post-op pain  Level of Consciousness: awake, alert , oriented and patient cooperative  Airway & Oxygen Therapy: Patient Spontanous Breathing and Patient connected to face mask oxygen  Post-op Assessment: Report given to RN and Post -op Vital signs reviewed and stable  Post vital signs: Reviewed and stable  Last Vitals:  Vitals Value Taken Time  BP    Temp    Pulse    Resp    SpO2      Last Pain:  Vitals:   12/03/20 1223  TempSrc: Oral  PainSc: 0-No pain      Patients Stated Pain Goal: 6 (20/76/19 1550)  Complications: No notable events documented.

## 2020-12-03 NOTE — Anesthesia Preprocedure Evaluation (Addendum)
Anesthesia Evaluation  Patient identified by MRN, date of birth, ID band Patient awake    Reviewed: Allergy & Precautions, NPO status , Patient's Chart, lab work & pertinent test results  History of Anesthesia Complications Negative for: history of anesthetic complications  Airway Mallampati: II  TM Distance: >3 FB Neck ROM: Full    Dental  (+) Teeth Intact, Dental Advisory Given   Pulmonary neg pulmonary ROS,    breath sounds clear to auscultation       Cardiovascular hypertension, Pt. on medications (-) angina Rhythm:Regular Rate:Normal     Neuro/Psych Anxiety Depression negative neurological ROS     GI/Hepatic Neg liver ROS, GERD  Medicated and Controlled,  Endo/Other  negative endocrine ROS  Renal/GU negative Renal ROS     Musculoskeletal  (+) Arthritis ,   Abdominal   Peds  Hematology negative hematology ROS (+)   Anesthesia Other Findings   Reproductive/Obstetrics                           Anesthesia Physical Anesthesia Plan  ASA: 2  Anesthesia Plan: General   Post-op Pain Management: Regional block and Tylenol PO (pre-op)   Induction: Intravenous  PONV Risk Score and Plan: 3 and Dexamethasone, Ondansetron and Treatment may vary due to age or medical condition  Airway Management Planned: LMA  Additional Equipment: None  Intra-op Plan:   Post-operative Plan: Extubation in OR  Informed Consent: I have reviewed the patients History and Physical, chart, labs and discussed the procedure including the risks, benefits and alternatives for the proposed anesthesia with the patient or authorized representative who has indicated his/her understanding and acceptance.     Dental advisory given  Plan Discussed with: CRNA and Surgeon  Anesthesia Plan Comments: (Plan routine monitors, GA with pectoralis block for post op analgesia)       Anesthesia Quick Evaluation

## 2020-12-03 NOTE — H&P (Signed)
70 year old female who underwent a screening mammogram with the finding of an incidental breast several small masses. She has no family history. She was noted to have an 8 mm 5 mm and 4 mm irregular mass cholesterol located in the lower medial breast. Ultrasound correlate showed the same thing. All 3 were biopsied. The clips are basically altogether 3 x 2 x 1 cm area in total. 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 She is here with her sister to discuss options today  Review of Systems: A complete review of systems was obtained from the patient. I have reviewed this information and discussed as appropriate with the patient. See HPI as well for other ROS.  Review of Systems  HENT: Positive for congestion and sinus pain.  Gastrointestinal: Positive for constipation and heartburn.  Genitourinary: Positive for frequency.  Musculoskeletal: Positive for joint pain.  Psychiatric/Behavioral: Positive for depression.    Medical History: Past Medical History:  Diagnosis Date   Anxiety   Arthritis   GERD (gastroesophageal reflux disease)   Patient Active Problem List  Diagnosis   Anxiety   Malignant neoplasm of lower-inner quadrant of right breast of female, estrogen receptor positive (CMS-HCC)   Weight loss   Easy Bleeding   Easy bruising   Constipation   Past Surgical History:  Procedure Laterality Date   ARTHROPLASTY TOTAL KNEE Right 09/01/2015   Blephorplasty Bilateral  Date Unknown    Allergies  Allergen Reactions   Codeine Nausea And Vomiting  Sick to stomach   Lisinopril Cough   Current Outpatient Medications on File Prior to Visit  Medication Sig Dispense Refill   demeclocycline (DECLOMYCIN) 300 MG tablet Take 1 tablet by mouth once daily   rosuvastatin (CRESTOR) 5 MG tablet Take 5 mg by mouth once daily   ALPRAZolam (XANAX) 0.5 MG tablet Take 0.5 mg by mouth 2 (two)  times daily as needed   azelastine (ASTELIN) 137 mcg nasal spray Place into one nostril   buPROPion (WELLBUTRIN XL) 150 MG XL tablet Take by mouth   cetirizine (ZYRTEC) 10 MG tablet Take by mouth   desvenlafaxine succinate (PRISTIQ) 50 MG ER tablet Take by mouth   ergocalciferol, vitamin D2, 1,250 mcg (50,000 unit) capsule Take by mouth   lansoprazole (PREVACID) 15 MG DR capsule Take by mouth   melatonin 10 mg Tab Take by mouth   zolpidem (AMBIEN) 10 mg tablet Take by mouth    Family History  Problem Relation Age of Onset   Obesity Mother   Hyperlipidemia (Elevated cholesterol) Mother   Coronary Artery Disease (Blocked arteries around heart) Father   Diabetes Father   Obesity Sister   Diabetes Paternal Uncle   Colon cancer Maternal Grandmother    Social History   Tobacco Use  Smoking Status Never Smoker  Smokeless Tobacco Never Used    Social History   Socioeconomic History   Marital status: Unknown  Tobacco Use   Smoking status: Never Smoker   Smokeless tobacco: Never Used  Scientific laboratory technician Use: Never used  Substance and Sexual Activity   Alcohol use: Yes  Comment: Occasionally   Drug use: Never   Objective:   Physical Exam Vitals reviewed.  Constitutional:  Appearance: Normal appearance.  Eyes:  General: No scleral icterus. Cardiovascular:  Rate and Rhythm: Normal rate.  Pulmonary:  Effort: Pulmonary effort is normal.  Chest:  Chest wall: Edema:  Breasts:  Right: No inverted nipple, mass or nipple discharge.  Left: No inverted nipple, mass or nipple discharge.  Lymphadenopathy:  Upper Body:  Right upper body: No supraclavicular or axillary adenopathy.  Left upper body: No supraclavicular or axillary adenopathy.  Neurological:  Mental Status: She is alert.  Psychiatric:  Mood and Affect: Mood normal.     Assessment and Plan:   Malignant neoplasm of lower-inner quadrant of right breast of female, estrogen receptor positive  (CMS-HCC)   Right breast seed bracketed lumpectomy, right ax sn biopsy She would also like breast reduction and I will have her see Dr Iran Planas to do this staged  We discussed the staging and pathophysiology of breast cancer. We discussed all of the different options for treatment for breast cancer including surgery, chemotherapy, radiation therapy, Herceptin, and antiestrogen therapy. We discussed a sentinel lymph node biopsy as she does not appear to having lymph node involvement right now. We discussed the performance of that with injection of radioactive tracer. We discussed that there is a chance of having a positive node with a sentinel lymph node biopsy and we will await the permanent pathology to make any other first further decisions in terms of her treatment. We discussed up to a 5% risk lifetime of chronic shoulder pain as well as lymphedema associated with a sentinel lymph node biopsy. We discussed the options for treatment of the breast cancer which included lumpectomy versus a mastectomy. We discussed the performance of the lumpectomy with radioactive seed placement. We discussed a 5-10% chance of a positive margin requiring reexcision in the operating room. We also discussed that she will likely need radiation therapy if she undergoes lumpectomy. We discussed mastectomy and the postoperative care for that as well. Mastectomy can be followed by reconstruction. The decision for lumpectomy vs mastectomy has no impact on decision for chemotherapy. Most mastectomy patients will not need radiation therapy. We discussed that there is no difference in her survival whether she undergoes lumpectomy with radiation therapy or antiestrogen therapy versus a mastectomy. There is also no real difference between her recurrence in the breast. We discussed the risks of operation including bleeding, infection, possible reoperation. She understands her further therapy will be based on what her stages at the time  of her operation. I will schedule about 3 weeks out due to hematoma after biopsy and awaiting plastic surgery appt

## 2020-12-03 NOTE — Progress Notes (Signed)
Magtrace injection performed by Dr Donne Hazel. Pt tol well, no sedation required, VSS

## 2020-12-03 NOTE — Anesthesia Procedure Notes (Signed)
Procedure Name: LMA Insertion Date/Time: 12/03/2020 2:38 PM Performed by: Adisynn Suleiman, Ernesta Amble, CRNA Pre-anesthesia Checklist: Patient identified, Emergency Drugs available, Suction available and Patient being monitored Patient Re-evaluated:Patient Re-evaluated prior to induction Oxygen Delivery Method: Circle System Utilized Preoxygenation: Pre-oxygenation with 100% oxygen Induction Type: IV induction Ventilation: Mask ventilation without difficulty LMA: LMA inserted LMA Size: 4.0 Number of attempts: 1 Airway Equipment and Method: bite block Placement Confirmation: positive ETCO2 Tube secured with: Tape Dental Injury: Teeth and Oropharynx as per pre-operative assessment

## 2020-12-03 NOTE — Discharge Instructions (Addendum)
[facility] Office Phone Number [phone]  BREAST BIOPSY/ PARTIAL MASTECTOMY: POST OP INSTRUCTIONS Take 400 mg of ibuprofen every 8 hours or 650 mg tylenol every 6 hours for next 72 hours then as needed. Use ice several times daily also. Always review your discharge instruction sheet given to you by the facility where your surgery was performed.  IF YOU HAVE DISABILITY OR FAMILY LEAVE FORMS, YOU MUST BRING THEM TO THE OFFICE FOR PROCESSING.  DO NOT GIVE THEM TO YOUR DOCTOR.  A prescription for pain medication may be given to you upon discharge.  Take your pain medication as prescribed, if needed.  If narcotic pain medicine is not needed, then you may take acetaminophen (Tylenol), naprosyn (Alleve) or ibuprofen (Advil) as needed. Take your usually prescribed medications unless otherwise directed If you need a refill on your pain medication, please contact your pharmacy.  They will contact our office to request authorization.  Prescriptions will not be filled after 5pm or on week-ends. You should eat very light the first 24 hours after surgery, such as soup, crackers, pudding, etc.  Resume your normal diet the day after surgery. Most patients will experience some swelling and bruising in the breast.  Ice packs and a good support bra will help.  Wear the breast binder provided or a sports bra for 72 hours day and night.  After that wear a sports bra during the day until you return to the office. Swelling and bruising can take several days to resolve.  It is common to experience some constipation if taking pain medication after surgery.  Increasing fluid intake and taking a stool softener will usually help or prevent this problem from occurring.  A mild laxative (Milk of Magnesia or Miralax) should be taken according to package directions if there are no bowel movements after 48 hours. Unless discharge instructions indicate otherwise, you may remove your bandages 48 hours after surgery  and you may shower at that time.  You may have steri-strips (small skin tapes) in place directly over the incision.  These strips should be left on the skin for 7-10 days and will come off on their own.  If your surgeon used skin glue on the incision, you may shower in 24 hours.  The glue will flake off over the next 2-3 weeks.  Any sutures or staples will be removed at the office during your follow-up visit. ACTIVITIES:  You may resume regular daily activities (gradually increasing) beginning the next day.  Wearing a good support bra or sports bra minimizes pain and swelling.  You may have sexual intercourse when it is comfortable. You may drive when you no longer are taking prescription pain medication, you can comfortably wear a seatbelt, and you can safely maneuver your car and apply brakes. RETURN TO WORK:  ______________________________________________________________________________________ You should see your doctor in the office for a follow-up appointment approximately two weeks after your surgery.  Your doctor's nurse will typically make your follow-up appointment when she calls you with your pathology report.  Expect your pathology report 3-4 business days after your surgery.  You may call to check if you do not hear from us after three days. OTHER INSTRUCTIONS: _______________________________________________________________________________________________ _____________________________________________________________________________________________________________________________________ _____________________________________________________________________________________________________________________________________ _____________________________________________________________________________________________________________________________________  WHEN TO CALL DR WAKEFIELD: Fever over 101.0 Nausea and/or vomiting. Extreme swelling or bruising. Continued bleeding from incision. Increased  pain, redness, or drainage from the incision.  The clinic staff is available to answer your questions during regular business hours.  Please don't hesitate to call   and ask to speak to one of the nurses for clinical concerns.  If you have a medical emergency, go to the nearest emergency room or call 911.  A surgeon from [facility] is always on call at the hospital.  For further questions, please visit [email or web address]   Post Anesthesia Home Care Instructions  Activity: Get plenty of rest for the remainder of the day. A responsible individual must stay with you for 24 hours following the procedure.  For the next 24 hours, DO NOT: -Drive a car -Paediatric nurse -Drink alcoholic beverages -Take any medication unless instructed by your physician -Make any legal decisions or sign important papers.  Meals: Start with liquid foods such as gelatin or soup. Progress to regular foods as tolerated. Avoid greasy, spicy, heavy foods. If nausea and/or vomiting occur, drink only clear liquids until the nausea and/or vomiting subsides. Call your physician if vomiting continues.  Special Instructions/Symptoms: Your throat may feel dry or sore from the anesthesia or the breathing tube placed in your throat during surgery. If this causes discomfort, gargle with warm salt water. The discomfort should disappear within 24 hours.  If you had a scopolamine patch placed behind your ear for the management of post- operative nausea and/or vomiting:  1. The medication in the patch is effective for 72 hours, after which it should be removed.  Wrap patch in a tissue and discard in the trash. Wash hands thoroughly with soap and water. 2. You may remove the patch earlier than 72 hours if you experience unpleasant side effects which may include dry mouth, dizziness or visual disturbances. 3. Avoid touching the patch. Wash your hands with soap and water after contact with the patch.  *May  have Tylenol at 6:30pm today 12/03/20

## 2020-12-04 ENCOUNTER — Encounter (HOSPITAL_BASED_OUTPATIENT_CLINIC_OR_DEPARTMENT_OTHER): Payer: Self-pay | Admitting: General Surgery

## 2020-12-08 DIAGNOSIS — R911 Solitary pulmonary nodule: Secondary | ICD-10-CM | POA: Diagnosis not present

## 2020-12-08 DIAGNOSIS — S01111A Laceration without foreign body of right eyelid and periocular area, initial encounter: Secondary | ICD-10-CM | POA: Diagnosis not present

## 2020-12-08 DIAGNOSIS — C50911 Malignant neoplasm of unspecified site of right female breast: Secondary | ICD-10-CM | POA: Diagnosis not present

## 2020-12-08 DIAGNOSIS — Z170 Estrogen receptor positive status [ER+]: Secondary | ICD-10-CM | POA: Diagnosis not present

## 2020-12-08 NOTE — Progress Notes (Signed)

## 2020-12-09 ENCOUNTER — Ambulatory Visit (HOSPITAL_BASED_OUTPATIENT_CLINIC_OR_DEPARTMENT_OTHER)
Admission: RE | Admit: 2020-12-09 | Discharge: 2020-12-09 | Disposition: A | Discharge status: Home / Self Care | Payer: Medicare PPO | Attending: Plastic Surgery | Admitting: Plastic Surgery

## 2020-12-09 ENCOUNTER — Ambulatory Visit (HOSPITAL_BASED_OUTPATIENT_CLINIC_OR_DEPARTMENT_OTHER): Payer: Medicare PPO | Admitting: Anesthesiology

## 2020-12-09 ENCOUNTER — Encounter (HOSPITAL_BASED_OUTPATIENT_CLINIC_OR_DEPARTMENT_OTHER): Payer: Self-pay | Admitting: Plastic Surgery

## 2020-12-09 ENCOUNTER — Encounter (HOSPITAL_BASED_OUTPATIENT_CLINIC_OR_DEPARTMENT_OTHER)
Admission: RE | Disposition: A | Discharge status: Home / Self Care | Payer: Self-pay | Source: Home / Self Care | Attending: Plastic Surgery

## 2020-12-09 DIAGNOSIS — Z79899 Other long term (current) drug therapy: Secondary | ICD-10-CM | POA: Insufficient documentation

## 2020-12-09 DIAGNOSIS — R897 Abnormal histological findings in specimens from other organs, systems and tissues: Secondary | ICD-10-CM | POA: Diagnosis not present

## 2020-12-09 DIAGNOSIS — C50311 Malignant neoplasm of lower-inner quadrant of right female breast: Secondary | ICD-10-CM | POA: Diagnosis not present

## 2020-12-09 DIAGNOSIS — I10 Essential (primary) hypertension: Secondary | ICD-10-CM | POA: Diagnosis not present

## 2020-12-09 DIAGNOSIS — N6011 Diffuse cystic mastopathy of right breast: Secondary | ICD-10-CM | POA: Diagnosis not present

## 2020-12-09 DIAGNOSIS — M1990 Unspecified osteoarthritis, unspecified site: Secondary | ICD-10-CM | POA: Diagnosis not present

## 2020-12-09 DIAGNOSIS — G8929 Other chronic pain: Secondary | ICD-10-CM | POA: Insufficient documentation

## 2020-12-09 DIAGNOSIS — Z9889 Other specified postprocedural states: Secondary | ICD-10-CM | POA: Diagnosis not present

## 2020-12-09 DIAGNOSIS — Z170 Estrogen receptor positive status [ER+]: Secondary | ICD-10-CM | POA: Insufficient documentation

## 2020-12-09 DIAGNOSIS — N6489 Other specified disorders of breast: Secondary | ICD-10-CM | POA: Diagnosis not present

## 2020-12-09 DIAGNOSIS — C50911 Malignant neoplasm of unspecified site of right female breast: Secondary | ICD-10-CM | POA: Diagnosis not present

## 2020-12-09 DIAGNOSIS — M549 Dorsalgia, unspecified: Secondary | ICD-10-CM | POA: Diagnosis not present

## 2020-12-09 DIAGNOSIS — K219 Gastro-esophageal reflux disease without esophagitis: Secondary | ICD-10-CM | POA: Diagnosis not present

## 2020-12-09 DIAGNOSIS — R920 Mammographic microcalcification found on diagnostic imaging of breast: Secondary | ICD-10-CM | POA: Diagnosis not present

## 2020-12-09 DIAGNOSIS — N62 Hypertrophy of breast: Secondary | ICD-10-CM | POA: Diagnosis not present

## 2020-12-09 DIAGNOSIS — N6092 Unspecified benign mammary dysplasia of left breast: Secondary | ICD-10-CM | POA: Diagnosis not present

## 2020-12-09 DIAGNOSIS — M542 Cervicalgia: Secondary | ICD-10-CM | POA: Diagnosis not present

## 2020-12-09 HISTORY — PX: BREAST REDUCTION SURGERY: SHX8

## 2020-12-09 HISTORY — PX: BREAST RECONSTRUCTION: SHX9

## 2020-12-09 LAB — SURGICAL PATHOLOGY

## 2020-12-09 SURGERY — RECONSTRUCTION, BREAST
Anesthesia: General | Site: Breast | Laterality: Right

## 2020-12-09 MED ORDER — LIDOCAINE 2% (20 MG/ML) 5 ML SYRINGE
INTRAMUSCULAR | Status: AC
  Filled 2020-12-09: qty 5

## 2020-12-09 MED ORDER — PHENYLEPHRINE HCL (PRESSORS) 10 MG/ML IV SOLN
INTRAVENOUS | Status: AC
  Filled 2020-12-09: qty 1

## 2020-12-09 MED ORDER — KETOROLAC TROMETHAMINE 30 MG/ML IJ SOLN
INTRAMUSCULAR | Status: AC
  Filled 2020-12-09: qty 1

## 2020-12-09 MED ORDER — LIDOCAINE HCL (CARDIAC) PF 100 MG/5ML IV SOSY
PREFILLED_SYRINGE | INTRAVENOUS | Status: DC | PRN
  Administered 2020-12-09: 80 mg via INTRAVENOUS

## 2020-12-09 MED ORDER — ACETAMINOPHEN 500 MG PO TABS
1000.0000 mg | ORAL_TABLET | ORAL | Status: AC
  Administered 2020-12-09: 1000 mg via ORAL

## 2020-12-09 MED ORDER — CHLORHEXIDINE GLUCONATE CLOTH 2 % EX PADS: 6.0000 | MEDICATED_PAD | Freq: Once | CUTANEOUS | Status: DC

## 2020-12-09 MED ORDER — 0.9 % SODIUM CHLORIDE (POUR BTL) OPTIME
TOPICAL | Status: DC | PRN
  Administered 2020-12-09: 250 mL

## 2020-12-09 MED ORDER — ONDANSETRON HCL 4 MG/2ML IJ SOLN: 4.0000 mg | Freq: Once | INTRAMUSCULAR | Status: DC | PRN

## 2020-12-09 MED ORDER — PHENYLEPHRINE 40 MCG/ML (10ML) SYRINGE FOR IV PUSH (FOR BLOOD PRESSURE SUPPORT)
PREFILLED_SYRINGE | INTRAVENOUS | Status: AC
  Filled 2020-12-09: qty 10

## 2020-12-09 MED ORDER — FENTANYL CITRATE (PF) 100 MCG/2ML IJ SOLN
INTRAMUSCULAR | Status: AC
  Filled 2020-12-09: qty 2

## 2020-12-09 MED ORDER — TRAMADOL HCL 50 MG PO TABS: 50.0000 mg | ORAL_TABLET | Freq: Four times a day (QID) | ORAL | 0 refills | Status: DC | PRN

## 2020-12-09 MED ORDER — EPHEDRINE 5 MG/ML INJ
INTRAVENOUS | Status: AC
  Filled 2020-12-09: qty 5

## 2020-12-09 MED ORDER — PROPOFOL 10 MG/ML IV BOLUS
INTRAVENOUS | Status: DC | PRN
  Administered 2020-12-09: 200 mg via INTRAVENOUS

## 2020-12-09 MED ORDER — PHENYLEPHRINE HCL-NACL 20-0.9 MG/250ML-% IV SOLN
INTRAVENOUS | Status: DC | PRN
  Administered 2020-12-09: 30 ug/min via INTRAVENOUS

## 2020-12-09 MED ORDER — DEXAMETHASONE SODIUM PHOSPHATE 4 MG/ML IJ SOLN
INTRAMUSCULAR | Status: DC | PRN
  Administered 2020-12-09: 10 mg via INTRAVENOUS

## 2020-12-09 MED ORDER — MIDAZOLAM HCL 2 MG/2ML IJ SOLN
INTRAMUSCULAR | Status: AC
  Filled 2020-12-09: qty 2

## 2020-12-09 MED ORDER — BUPIVACAINE-EPINEPHRINE 0.25% -1:200000 IJ SOLN
INTRAMUSCULAR | Status: DC | PRN
  Administered 2020-12-09: 30 mL

## 2020-12-09 MED ORDER — AMISULPRIDE (ANTIEMETIC) 5 MG/2ML IV SOLN: 10.0000 mg | Freq: Once | INTRAVENOUS | Status: DC | PRN

## 2020-12-09 MED ORDER — CEFAZOLIN SODIUM-DEXTROSE 2-4 GM/100ML-% IV SOLN
2.0000 g | INTRAVENOUS | Status: AC
  Administered 2020-12-09: 2 g via INTRAVENOUS

## 2020-12-09 MED ORDER — FENTANYL CITRATE (PF) 100 MCG/2ML IJ SOLN
25.0000 ug | INTRAMUSCULAR | Status: DC | PRN
  Administered 2020-12-09 (×3): 50 ug via INTRAVENOUS

## 2020-12-09 MED ORDER — GABAPENTIN 300 MG PO CAPS
ORAL_CAPSULE | ORAL | Status: AC
  Filled 2020-12-09: qty 1

## 2020-12-09 MED ORDER — ONDANSETRON HCL 4 MG/2ML IJ SOLN
INTRAMUSCULAR | Status: DC | PRN
  Administered 2020-12-09: 4 mg via INTRAVENOUS

## 2020-12-09 MED ORDER — MIDAZOLAM HCL 5 MG/5ML IJ SOLN
INTRAMUSCULAR | Status: DC | PRN
  Administered 2020-12-09: 2 mg via INTRAVENOUS

## 2020-12-09 MED ORDER — ONDANSETRON HCL 4 MG/2ML IJ SOLN
INTRAMUSCULAR | Status: AC
  Filled 2020-12-09: qty 2

## 2020-12-09 MED ORDER — ATROPINE SULFATE 0.4 MG/ML IV SOLN
INTRAVENOUS | Status: AC
  Filled 2020-12-09: qty 1

## 2020-12-09 MED ORDER — FENTANYL CITRATE (PF) 100 MCG/2ML IJ SOLN
INTRAMUSCULAR | Status: DC | PRN
  Administered 2020-12-09: 25 ug via INTRAVENOUS
  Administered 2020-12-09: 50 ug via INTRAVENOUS
  Administered 2020-12-09: 25 ug via INTRAVENOUS
  Administered 2020-12-09 (×2): 50 ug via INTRAVENOUS

## 2020-12-09 MED ORDER — LACTATED RINGERS IV SOLN: INTRAVENOUS | Status: DC

## 2020-12-09 MED ORDER — CEFAZOLIN SODIUM-DEXTROSE 2-4 GM/100ML-% IV SOLN
INTRAVENOUS | Status: AC
  Filled 2020-12-09: qty 100

## 2020-12-09 MED ORDER — EPHEDRINE SULFATE 50 MG/ML IJ SOLN
INTRAMUSCULAR | Status: DC | PRN
  Administered 2020-12-09: 10 mg via INTRAVENOUS
  Administered 2020-12-09: 15 mg via INTRAVENOUS
  Administered 2020-12-09: 10 mg via INTRAVENOUS

## 2020-12-09 MED ORDER — KETOROLAC TROMETHAMINE 30 MG/ML IJ SOLN
INTRAMUSCULAR | Status: DC | PRN
  Administered 2020-12-09: 30 mg via INTRAVENOUS

## 2020-12-09 MED ORDER — ACETAMINOPHEN 500 MG PO TABS
ORAL_TABLET | ORAL | Status: AC
  Filled 2020-12-09: qty 2

## 2020-12-09 MED ORDER — OXYCODONE HCL 5 MG PO TABS
5.0000 mg | ORAL_TABLET | ORAL | Status: DC | PRN
  Administered 2020-12-09: 5 mg via ORAL

## 2020-12-09 MED ORDER — BUPIVACAINE-EPINEPHRINE (PF) 0.25% -1:200000 IJ SOLN
INTRAMUSCULAR | Status: AC
  Filled 2020-12-09: qty 30

## 2020-12-09 MED ORDER — DEXAMETHASONE SODIUM PHOSPHATE 10 MG/ML IJ SOLN
INTRAMUSCULAR | Status: AC
  Filled 2020-12-09: qty 1

## 2020-12-09 MED ORDER — SUCCINYLCHOLINE CHLORIDE 200 MG/10ML IV SOSY
PREFILLED_SYRINGE | INTRAVENOUS | Status: AC
  Filled 2020-12-09: qty 10

## 2020-12-09 MED ORDER — OXYCODONE HCL 5 MG PO TABS
ORAL_TABLET | ORAL | Status: AC
  Filled 2020-12-09: qty 1

## 2020-12-09 MED ORDER — GABAPENTIN 300 MG PO CAPS
300.0000 mg | ORAL_CAPSULE | ORAL | Status: AC
  Administered 2020-12-09: 300 mg via ORAL

## 2020-12-09 SURGICAL SUPPLY — 72 items
ADH SKN CLS APL DERMABOND .7 (GAUZE/BANDAGES/DRESSINGS) ×8
APL PRP STRL LF DISP 70% ISPRP (MISCELLANEOUS) ×4
BAG DECANTER FOR FLEXI CONT (MISCELLANEOUS) ×1 IMPLANT
BINDER BREAST 3XL (GAUZE/BANDAGES/DRESSINGS) IMPLANT
BINDER BREAST LRG (GAUZE/BANDAGES/DRESSINGS) ×2 IMPLANT
BINDER BREAST MEDIUM (GAUZE/BANDAGES/DRESSINGS) IMPLANT
BINDER BREAST XLRG (GAUZE/BANDAGES/DRESSINGS) IMPLANT
BINDER BREAST XXLRG (GAUZE/BANDAGES/DRESSINGS) IMPLANT
BLADE SURG 10 STRL SS (BLADE) ×10 IMPLANT
BLADE SURG 15 STRL LF DISP TIS (BLADE) IMPLANT
BLADE SURG 15 STRL SS (BLADE)
BNDG ELASTIC 6X5.8 VLCR STR LF (GAUZE/BANDAGES/DRESSINGS) IMPLANT
BNDG GAUZE ELAST 4 BULKY (GAUZE/BANDAGES/DRESSINGS) ×6 IMPLANT
CANISTER SUCT 1200ML W/VALVE (MISCELLANEOUS) ×3 IMPLANT
CHLORAPREP W/TINT 26 (MISCELLANEOUS) ×6 IMPLANT
COVER BACK TABLE 60X90IN (DRAPES) ×3 IMPLANT
COVER MAYO STAND STRL (DRAPES) ×3 IMPLANT
DECANTER SPIKE VIAL GLASS SM (MISCELLANEOUS) IMPLANT
DERMABOND ADVANCED (GAUZE/BANDAGES/DRESSINGS) ×4
DERMABOND ADVANCED .7 DNX12 (GAUZE/BANDAGES/DRESSINGS) ×5 IMPLANT
DRAIN CHANNEL 15F RND FF W/TCR (WOUND CARE) ×4 IMPLANT
DRAPE TOP ARMCOVERS (MISCELLANEOUS) ×3 IMPLANT
DRAPE U-SHAPE 76X120 STRL (DRAPES) ×3 IMPLANT
DRAPE UTILITY XL STRL (DRAPES) ×3 IMPLANT
DRSG PAD ABDOMINAL 8X10 ST (GAUZE/BANDAGES/DRESSINGS) ×6 IMPLANT
ELECT BLADE 4.0 EZ CLEAN MEGAD (MISCELLANEOUS) ×3
ELECT COATED BLADE 2.86 ST (ELECTRODE) ×3 IMPLANT
ELECT REM PT RETURN 9FT ADLT (ELECTROSURGICAL) ×3
ELECTRODE BLDE 4.0 EZ CLN MEGD (MISCELLANEOUS) ×2 IMPLANT
ELECTRODE REM PT RTRN 9FT ADLT (ELECTROSURGICAL) ×2 IMPLANT
EVACUATOR SILICONE 100CC (DRAIN) ×4 IMPLANT
GLOVE SURG ENC MOIS LTX SZ6.5 (GLOVE) IMPLANT
GLOVE SURG HYDRASOFT LTX SZ5.5 (GLOVE) ×4 IMPLANT
GLOVE SURG POLYISO LF SZ7 (GLOVE) ×1 IMPLANT
GLOVE SURG UNDER POLY LF SZ7 (GLOVE) ×4 IMPLANT
GOWN STRL REUS W/ TWL LRG LVL3 (GOWN DISPOSABLE) ×4 IMPLANT
GOWN STRL REUS W/TWL LRG LVL3 (GOWN DISPOSABLE) ×6
IV NS 500ML (IV SOLUTION)
IV NS 500ML BAXH (IV SOLUTION) ×2 IMPLANT
KIT FILL SYSTEM UNIVERSAL (SET/KITS/TRAYS/PACK) IMPLANT
MARKER SKIN DUAL TIP RULER LAB (MISCELLANEOUS) IMPLANT
NDL HYPO 25X1 1.5 SAFETY (NEEDLE) IMPLANT
NEEDLE HYPO 25X1 1.5 SAFETY (NEEDLE) ×3 IMPLANT
NS IRRIG 1000ML POUR BTL (IV SOLUTION) ×3 IMPLANT
PACK BASIN DAY SURGERY FS (CUSTOM PROCEDURE TRAY) ×3 IMPLANT
PENCIL SMOKE EVACUATOR (MISCELLANEOUS) ×3 IMPLANT
PIN SAFETY STERILE (MISCELLANEOUS) ×3 IMPLANT
PUNCH BIOPSY DERMAL 4MM (MISCELLANEOUS) IMPLANT
SHEET MEDIUM DRAPE 40X70 STRL (DRAPES) ×6 IMPLANT
SLEEVE SCD COMPRESS KNEE MED (STOCKING) ×3 IMPLANT
SPONGE T-LAP 18X18 ~~LOC~~+RFID (SPONGE) ×6 IMPLANT
STAPLER VISISTAT 35W (STAPLE) ×4 IMPLANT
SUT ETHILON 2 0 FS 18 (SUTURE) ×3 IMPLANT
SUT MNCRL AB 4-0 PS2 18 (SUTURE) ×12 IMPLANT
SUT PDS 3-0 CT2 (SUTURE)
SUT PDS AB 2-0 CT2 27 (SUTURE) ×2 IMPLANT
SUT PDS II 3-0 CT2 27 ABS (SUTURE) IMPLANT
SUT PROLENE 2 0 CT2 30 (SUTURE) IMPLANT
SUT VIC AB 3-0 PS1 18 (SUTURE) ×24
SUT VIC AB 3-0 PS1 18XBRD (SUTURE) ×8 IMPLANT
SUT VIC AB 3-0 SH 27 (SUTURE)
SUT VIC AB 3-0 SH 27X BRD (SUTURE) IMPLANT
SUT VICRYL 4-0 PS2 18IN ABS (SUTURE) ×6 IMPLANT
SYR 50ML LL SCALE MARK (SYRINGE) IMPLANT
SYR BULB EAR ULCER 3OZ GRN STR (SYRINGE) ×1 IMPLANT
SYR BULB IRRIG 60ML STRL (SYRINGE) ×2 IMPLANT
SYR CONTROL 10ML LL (SYRINGE) ×3 IMPLANT
TAPE MEASURE VINYL STERILE (MISCELLANEOUS) IMPLANT
TOWEL GREEN STERILE FF (TOWEL DISPOSABLE) ×6 IMPLANT
TUBE CONNECTING 20X1/4 (TUBING) ×3 IMPLANT
UNDERPAD 30X36 HEAVY ABSORB (UNDERPADS AND DIAPERS) ×6 IMPLANT
YANKAUER SUCT BULB TIP NO VENT (SUCTIONS) ×3 IMPLANT

## 2020-12-09 NOTE — Transfer of Care (Signed)
Immediate Anesthesia Transfer of Care Note  Patient: [name]  Procedure(s) Performed: RIGHT ONCOPLASTIC BREAST RECONSTRUCTION (Right: Breast) LEFT MAMMARY REDUCTION  (BREAST) (Left: Breast)  Patient Location: PACU  Anesthesia Type:General  Level of Consciousness: awake, alert , oriented, drowsy and patient cooperative  Airway & Oxygen Therapy: Patient Spontanous Breathing and Patient connected to face mask oxygen  Post-op Assessment: Report given to RN and Post -op Vital signs reviewed and stable  Post vital signs: Reviewed and stable  Last Vitals:  Vitals Value Taken Time  BP    Temp    Pulse 101 12/09/20 1311  Resp 15 12/09/20 1311  SpO2 100 % 12/09/20 1311  Vitals shown include unvalidated device data.  Last Pain:  Vitals:   12/09/20 0823  TempSrc: Oral  PainSc: 0-No pain         Complications: No notable events documented.

## 2020-12-09 NOTE — Anesthesia Procedure Notes (Signed)
Procedure Name: LMA Insertion Date/Time: 12/09/2020 9:50 AM Performed by: Willa Frater, CRNA Pre-anesthesia Checklist: Patient identified, Emergency Drugs available, Suction available and Patient being monitored Patient Re-evaluated:Patient Re-evaluated prior to induction Oxygen Delivery Method: Circle system utilized Preoxygenation: Pre-oxygenation with 100% oxygen Induction Type: IV induction Ventilation: Mask ventilation without difficulty LMA: LMA inserted LMA Size: 4.0 Number of attempts: 1 Airway Equipment and Method: Bite block Placement Confirmation: positive ETCO2 Tube secured with: Tape Dental Injury: Teeth and Oropharynx as per pre-operative assessment

## 2020-12-09 NOTE — Anesthesia Postprocedure Evaluation (Signed)
Anesthesia Post Note  Patient: Lulia Schriner  Procedure(s) Performed: RIGHT ONCOPLASTIC BREAST RECONSTRUCTION (Right: Breast) LEFT MAMMARY REDUCTION  (BREAST) (Left: Breast)     Patient location during evaluation: PACU Anesthesia Type: General Level of consciousness: awake and alert Pain management: pain level controlled Vital Signs Assessment: post-procedure vital signs reviewed and stable Respiratory status: spontaneous breathing, nonlabored ventilation, respiratory function stable and patient connected to nasal cannula oxygen Cardiovascular status: blood pressure returned to baseline and stable Postop Assessment: no apparent nausea or vomiting Anesthetic complications: no   No notable events documented.  Last Vitals:  Vitals:   12/09/20 1400 12/09/20 1445  BP: 116/65 128/77  Pulse: 96 (!) 103  Resp: 15 14  Temp:  36.4 C  SpO2: 100% 99%    Last Pain:  Vitals:   12/09/20 1445  TempSrc: Oral  PainSc: 4                  Augie Vane L Inaaya Vellucci

## 2020-12-09 NOTE — Op Note (Signed)
Operative Note   DATE OF OPERATION: 12.6.22  LOCATION:  [facility]  SURGICAL DIVISION: Plastic Surgery  PREOPERATIVE DIAGNOSES:  1. Right breast ca LIQ ER+ 2. Macromastia 3. Chronic neck and back pain  POSTOPERATIVE DIAGNOSES:  same  PROCEDURE:  Right oncoplastic breast reconstruction left breast reduction  SURGEON: Irene Limbo MD MBA  ASSISTANT: none  ANESTHESIA:  General.   EBL: 45 ml  COMPLICATIONS: None immediate.   INDICATIONS FOR PROCEDURE:  The patient, [name], is a 70 y.o. female born on 09-29-1950, is here for stages reconstruction following right lumpectomy in setting of macromastia, chronic neck and back pain, that has failed conservative measures.    FINDINGS: Right reduction 97 g Left reduction 154 g  DESCRIPTION OF PROCEDURE:  The patient was marked standing in the preoperative area to mark sternal notch, chest midline, anterior axillary lines, inframammary folds. The location of new nipple areolar complex was marked at level of on inframammary fold on anterior surface breast by palpation. This was marked symmetric over bilateral breasts. With aid of Wise pattern marker, location of new nipple areolar complex and vertical limbs (7 cm) were marked by displacement of breasts along meridian. The patient was taken to the operating room. SCDs were placed and IV antibiotics were given. The patient's operative site was prepped and draped in a sterile fashion. A time out was performed and all information was confirmed to be correct.     Over left breast, superomedial pedicle marked and nipple areolar complex incised with 42 mm diameter marker. Pedicle deepithlialized and developed to chest wall. Breast tissue resected over lower pole. Medial and lateral flaps developed. Additional superior pole and lateral chest wall tissue excised. Breast tailor tacked closed.    I then directed attention to right breast where superomedial pedicle designed. NAC  marked with 42 mm diameter marker. The pedicle was deepithelialized. Pedicle developed until tension free rotation was possible. Lumpectomy cavity present over lower pole inner quadrant. Breast tissue maintained over lower pole lower outer quadrant on inferior dermoglandular pedicle to use as autoaugmentation flap. This tissue was de epithelialized and advanced superiorly and medially beneath pedicle and secured to chest wall with 2-0 PDS figure of eight suture. Medial and lateral flaps developed. Additional superior pole tissue excised. Breast tailor tacked closed, and patient brought to upright sitting position and assessed for symmetry. Patent returned to supine position. Breast cavities irrigated and hemostasis obtained. Local anesthetic infiltrated throughout each breast. 15 Fr JP placed in each breast and secured with 2-0 nylon. Closure completed bilateral with 3-0 vicryl to approximate dermis along inframammary fold and vertical limb. NAC inset with 4-0 vicryl in dermis. Skin closure completed with 4-0 monocryl subcuticular throughout. Tissue adhesive applied. Dry dressing and breast binder applied.  The patient was allowed to wake from anesthesia, extubated and taken to the recovery room in satisfactory condition.   SPECIMENS: right and left breast reduction  DRAINS: 15 Fr JP in right and left breast

## 2020-12-09 NOTE — Discharge Instructions (Addendum)
No Tylenol until 2:28 pm  No Ibuprofen/Motrin until 6:22 pm   Post Anesthesia Home Care Instructions  Activity: Get plenty of rest for the remainder of the day. A responsible individual must stay with you for 24 hours following the procedure.  For the next 24 hours, DO NOT: -Drive a car -Paediatric nurse -Drink alcoholic beverages -Take any medication unless instructed by your physician -Make any legal decisions or sign important papers.  Meals: Start with liquid foods such as gelatin or soup. Progress to regular foods as tolerated. Avoid greasy, spicy, heavy foods. If nausea and/or vomiting occur, drink only clear liquids until the nausea and/or vomiting subsides. Call your physician if vomiting continues.  Special Instructions/Symptoms: Your throat may feel dry or sore from the anesthesia or the breathing tube placed in your throat during surgery. If this causes discomfort, gargle with warm salt water. The discomfort should disappear within 24 hours.  If you had a scopolamine patch placed behind your ear for the management of post- operative nausea and/or vomiting:  1. The medication in the patch is effective for 72 hours, after which it should be removed.  Wrap patch in a tissue and discard in the trash. Wash hands thoroughly with soap and water. 2. You may remove the patch earlier than 72 hours if you experience unpleasant side effects which may include dry mouth, dizziness or visual disturbances. 3. Avoid touching the patch. Wash your hands with soap and water after contact with the patch.        JP Drain [employer] this sheet to all of your post-operative appointments while you have your drains. Please measure your drains by CC's or ML's. Make sure you drain and measure your JP Drains 2 or 3 times per day. At the end of each day, add up totals for the left side and add up totals for the right side.    ( 9 am )     ( 3 pm )        ( 9 pm )                Date L  R  L  R   L  R  Total L/R

## 2020-12-09 NOTE — Anesthesia Preprocedure Evaluation (Addendum)
Anesthesia Evaluation  Patient identified by MRN, date of birth, ID band Patient awake    Reviewed: Allergy & Precautions, NPO status , Patient's Chart, lab work & pertinent test results  History of Anesthesia Complications Negative for: history of anesthetic complications  Airway Mallampati: II  TM Distance: >3 FB Neck ROM: Full    Dental  (+) Teeth Intact, Dental Advisory Given   Pulmonary neg pulmonary ROS,    breath sounds clear to auscultation       Cardiovascular hypertension, Pt. on medications (-) angina Rhythm:Regular Rate:Normal     Neuro/Psych PSYCHIATRIC DISORDERS Anxiety Depression negative neurological ROS     GI/Hepatic Neg liver ROS, GERD  Medicated and Controlled,  Endo/Other  negative endocrine ROS  Renal/GU negative Renal ROS  negative genitourinary   Musculoskeletal  (+) Arthritis ,   Abdominal   Peds negative pediatric ROS (+)  Hematology negative hematology ROS (+)   Anesthesia Other Findings Gash over right eye with bruising over right eye/face  Reproductive/Obstetrics negative OB ROS                            Anesthesia Physical  Anesthesia Plan  ASA: 2  Anesthesia Plan: General   Post-op Pain Management: Tylenol PO (pre-op)   Induction: Intravenous  PONV Risk Score and Plan: 3 and Dexamethasone, Ondansetron, Treatment may vary due to age or medical condition and Midazolam  Airway Management Planned: LMA  Additional Equipment: None  Intra-op Plan:   Post-operative Plan: Extubation in OR  Informed Consent: I have reviewed the patients History and Physical, chart, labs and discussed the procedure including the risks, benefits and alternatives for the proposed anesthesia with the patient or authorized representative who has indicated his/her understanding and acceptance.     Dental advisory given  Plan Discussed with: CRNA, Surgeon and  Anesthesiologist  Anesthesia Plan Comments:         Anesthesia Quick Evaluation

## 2020-12-09 NOTE — H&P (Signed)
Subjective:   Patient ID: [name] is a 70 y.o. female.  HPI  Here for breast reconstruction following right lumpectomy. Presented following screening MMG showing a possible abnormality in the right breast. Diagnostic MMG/US showed mass in the right breast at the 5-6 o'clock position, a second irregular mass at 5 o'clock anterior depth, a third irregular mass at 5 o'clock middle depth, and a stable benign oval mass at 4 o'clock posterior depth. Biopsies of the masses at 6, 4, and 5 o'clock showed ILC with LCIS, ER/PR+ Her2 -.  Patient underwent lumpectomy. Final pathology report is pending at this time, but Dr. Donne Hazel and I have confirmed margins clear on lumpectomy specimen with pathology.  Plan Oncotype on surgical specimen.  Current 36DD. Wt within 5-10 lb of current.  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 Lives alone, in process of building home, selling [employer] home. Has sister in area to assist with post op care. Sister herself has had breast reduction with Dr. Percell Miller.  Review of Systems  Constitutional: Positive for unexpected weight change.  Hematological: Bruises/bleeds easily.   Remainder 12 point review negative  Objective:  Physical Exam Cardiovascular:  Rate and Rhythm: Normal rate and regular rhythm.  Heart sounds: Normal heart sounds.  Pulmonary:  Effort: Pulmonary effort is normal.  Breath sounds: Normal breath sounds.  Lymphadenopathy:  Upper Body:  Right upper body: No axillary adenopathy.  Left upper body: No axillary adenopathy.  Skin: Comments: Fitzpatrick 2 solar elastosis chest and breasts   HEENT: resolving ecchymoses laceration repair intact  Breasts: Grade 3 ptosis bilateral SN to nipple R 32 L 31 cm BW R 20 L 20 cm Nipple to IMF R 13 L 13 cm   Assessment:   Right breast ca LIQ ER+ S/p right lumpectomy SLN  Plan:   Plan oncoplastic reconstruction 7-10 d post lumpectomy to ensure pathologic clearance. Reviewed reduction with anchor type scars,  drains, post operative visits and limitations, recovery. Diminished sensation nipple and breast skin, risk of nipple loss, wound healing problems, asymmetry. Discussed will have some contraction of breast volume and increased firmness with radiation, less ptosis with aging. This can result in asymmetries long term. Discussed changes with wt gain, loss, aging. Discussed lumpectomy alone can result in NAC displacement, distortion contour breast following lumpectomy and RT, asymmetry breast volume and NAC position. Reviewed purpose of this type reconstruction to prevent these. Reviewed breast lift or trying to correct NAC displacement post RT more difficult, higher risk complications. Counseled I cannot assure her cup size.  Reviewed can defer surgery until after therapies complete. In this setting would wait at least 6 months from end RT for any surgery. Reviewed increased risks complications in setting RT. Reviewed any complications from oncoplastic reconstruction procedure may delay start RT.    Additional risks including but not limited to bleeding hematoma seroma damage to adjacent structures infection need for additional procedures blood clots in legs or lungs reviewed.  Drain teaching completed. Patient building home and reviewed post op restrictions.

## 2020-12-10 ENCOUNTER — Ambulatory Visit: Payer: Medicare PPO | Admitting: Endocrinology

## 2020-12-10 ENCOUNTER — Telehealth: Payer: Self-pay | Admitting: *Deleted

## 2020-12-10 ENCOUNTER — Encounter: Payer: Self-pay | Admitting: *Deleted

## 2020-12-10 LAB — SURGICAL PATHOLOGY

## 2020-12-10 NOTE — Telephone Encounter (Signed)
Ordered oncotype per Dr. Feng. Faxed requisition to pathology and exact sciences. °

## 2020-12-10 NOTE — Addendum Note (Signed)
Addendum  created 12/10/20 1241 by Nik Gorrell, Ernesta Amble, CRNA   Charge Capture section accepted

## 2020-12-11 ENCOUNTER — Encounter (HOSPITAL_BASED_OUTPATIENT_CLINIC_OR_DEPARTMENT_OTHER): Payer: Self-pay | Admitting: Plastic Surgery

## 2020-12-19 ENCOUNTER — Encounter: Payer: Self-pay | Admitting: *Deleted

## 2020-12-19 ENCOUNTER — Encounter: Payer: Self-pay | Admitting: Hematology

## 2020-12-19 ENCOUNTER — Telehealth: Payer: Self-pay | Admitting: *Deleted

## 2020-12-19 DIAGNOSIS — C50311 Malignant neoplasm of lower-inner quadrant of right female breast: Secondary | ICD-10-CM | POA: Diagnosis not present

## 2020-12-19 DIAGNOSIS — Z170 Estrogen receptor positive status [ER+]: Secondary | ICD-10-CM | POA: Diagnosis not present

## 2020-12-19 NOTE — Telephone Encounter (Signed)
Received oncotype score of 23. Physician team notified. Called pt with results. Discussed chemo not recommended. Nest step xrt with Dr. Sondra Come. Received verbal understanding.

## 2020-12-22 ENCOUNTER — Encounter (HOSPITAL_COMMUNITY): Payer: Self-pay

## 2020-12-24 ENCOUNTER — Other Ambulatory Visit: Payer: Self-pay | Admitting: *Deleted

## 2020-12-25 ENCOUNTER — Ambulatory Visit: Payer: Medicare PPO | Admitting: Physical Therapy

## 2020-12-30 ENCOUNTER — Encounter: Payer: Self-pay | Admitting: *Deleted

## 2021-01-08 ENCOUNTER — Encounter: Payer: Self-pay | Admitting: *Deleted

## 2021-01-08 ENCOUNTER — Other Ambulatory Visit: Payer: Self-pay

## 2021-01-08 ENCOUNTER — Ambulatory Visit: Payer: Medicare PPO | Attending: General Surgery | Admitting: Physical Therapy

## 2021-01-08 ENCOUNTER — Encounter: Payer: Self-pay | Admitting: Physical Therapy

## 2021-01-08 DIAGNOSIS — C50311 Malignant neoplasm of lower-inner quadrant of right female breast: Secondary | ICD-10-CM | POA: Diagnosis not present

## 2021-01-08 DIAGNOSIS — Z170 Estrogen receptor positive status [ER+]: Secondary | ICD-10-CM | POA: Insufficient documentation

## 2021-01-08 DIAGNOSIS — R293 Abnormal posture: Secondary | ICD-10-CM | POA: Insufficient documentation

## 2021-01-08 DIAGNOSIS — Z483 Aftercare following surgery for neoplasm: Secondary | ICD-10-CM | POA: Diagnosis not present

## 2021-01-08 NOTE — Therapy (Signed)
[facility] @ [place] [place], [place], [address] Phone: [phone]   Fax:  [phone]  Physical Therapy Treatment  Patient Details  Name: [name] MRN: [id number] Date of Birth: 10-Oct-1950 Referring Provider (PT): Dr. [name]   Encounter Date: 01/08/2021   PT End of Session - 01/08/21 1052     Visit Number 2    Number of Visits 2    PT Start Time 1003    PT Stop Time 1055    PT Time Calculation (min) 52 min    Activity Tolerance Patient tolerated treatment well    Behavior During Therapy [facility] for tasks assessed/performed             Past Medical History:  Diagnosis Date   Anxiety    takes Xanax daily    Arthritis    Breast cancer ([place])    Depression    takes Pristiq and Wellbutrin daily   GERD (gastroesophageal reflux disease)    Joint pain    Joint swelling    Nocturia    Seasonal allergies    Takes Zyrtec nightly along with Nasal Spray   Vitamin D deficiency    new script for Vit D to start 08/22/15    Past Surgical History:  Procedure Laterality Date   blephorplasty Bilateral    BREAST LUMPECTOMY WITH RADIOACTIVE SEED AND SENTINEL LYMPH NODE BIOPSY Right 12/03/2020   Procedure: RIGHT BREAST LUMPECTOMY WITH RADIOACTIVE SEEDS X3 AND AXILLARY SENTINEL LYMPH NODE BIOPSY;  Surgeon: [name], MD;  Location: [facility];  Service: General;  Laterality: Right;   BREAST RECONSTRUCTION Right 12/09/2020   Procedure: RIGHT ONCOPLASTIC BREAST RECONSTRUCTION;  Surgeon: Irene Limbo, MD;  Location: [facility];  Service: Plastics;  Laterality: Right;   BREAST REDUCTION SURGERY Left 12/09/2020   Procedure: LEFT MAMMARY REDUCTION  (BREAST);  Surgeon: Irene Limbo, MD;  Location: [facility];  Service: Plastics;  Laterality: Left;   COLONOSCOPY     left leg surgery     rod   TOTAL KNEE ARTHROPLASTY Right 09/01/2015   TOTAL KNEE ARTHROPLASTY  Right 09/01/2015   Procedure: TOTAL KNEE ARTHROPLASTY;  Surgeon: Frederik Pear, MD;  Location: [facility];  Service: Orthopedics;  Laterality: Right;    There were no vitals filed for this visit.   Subjective Assessment - 01/08/21 1016     Subjective Patient underwent a right lumpectomy and sentinel node biopsy (4 nodes all negative except one with isolated tumor cells) on 12/03/2020. She then underwent a bilateral breast reduction on 12/09/2020. Her Oncotype score was low so no need for chemotherapy. She is scheduled for radiation to begin in mid January.    Pertinent History Patient was diagnosed on 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 She underwent a right lumpectomy and sentinel node biopsy (4 nodes all negative except one with isolated tumor cells) on 12/03/2020. She then underwent a bilateral breast reduction on 12/09/2020.. It is ER/PR positive and HER2 negative with a Ki67 of 5%.    Patient Stated Goals See if my arm is getting back to normal    Currently in Pain? No/denies                [facility] PT Assessment - 01/08/21 0001       Assessment   Medical Diagnosis Right breast cancer s/p lumpectomy SLNB and bil reduction    Referring Provider (PT) Dr. [name]    Onset Date/Surgical  Date 12/03/20    Hand Dominance Right    Prior Therapy Baselines      Precautions   Precautions Other (comment)    Precaution Comments recent surgery and right UE lymphedema risk      Restrictions   Weight Bearing Restrictions No      Balance Screen   Has the patient fallen in the past 6 months No    Has the patient had a decrease in activity level because of a fear of falling?  No    Is the patient reluctant to leave their home because of a fear of falling?  No      Home Environment   Living Environment Private residence    Living Arrangements Alone    Available Help at Discharge Family      Prior Function   Level of Independence Independent    Vocation  Retired    Leisure Walks 20-30 min 3-4x/week      Cognition   Overall Cognitive Status Within Functional Limits for tasks assessed      Observation/Other Assessments   Observations Bilateral breast incisions are healing well with mild swelling present bilaterally. Right axillary incision is well healed with good mobility. No significant scar tissue present; healing well with no axillary cording present.      Posture/Postural Control   Posture/Postural Control Postural limitations    Postural Limitations Rounded Shoulders;Forward head      ROM / Strength   AROM / PROM / Strength AROM      AROM   AROM Assessment Site Shoulder    Right/Left Shoulder Right;Left    Right Shoulder Extension 43 Degrees    Right Shoulder Flexion 143 Degrees    Right Shoulder ABduction 165 Degrees    Right Shoulder Internal Rotation 63 Degrees    Right Shoulder External Rotation 86 Degrees    Left Shoulder Extension 43 Degrees    Left Shoulder Flexion 150 Degrees    Left Shoulder ABduction 155 Degrees    Left Shoulder Internal Rotation 65 Degrees    Left Shoulder External Rotation 66 Degrees               LYMPHEDEMA/ONCOLOGY QUESTIONNAIRE - 01/08/21 0001       Type   Cancer Type Right breast cancer      Surgeries   Lumpectomy Date 12/03/20    Sentinel Lymph Node Biopsy Date 12/03/20    Number Lymph Nodes Removed 4      Treatment   Active Chemotherapy Treatment No    Past Chemotherapy Treatment No    Active Radiation Treatment No    Past Radiation Treatment No    Current Hormone Treatment No    Past Hormone Therapy No      What other symptoms do you have   Are you Having Heaviness or Tightness No    Are you having Pain No    Are you having pitting edema No    Is it Hard or Difficult finding clothes that fit No    Do you have infections No    Is there Decreased scar mobility Yes    Stemmer Sign No      Lymphedema Assessments   Lymphedema Assessments Upper extremities       Right Upper Extremity Lymphedema   10 cm Proximal to Olecranon Process 27.4 cm    Olecranon Process 22.8 cm    10 cm Proximal to Ulnar Styloid Process 19 cm    Just Proximal to Ulnar Styloid  Process 14.3 cm    Across Hand at [employer] 18.5 cm    At [place] of 2nd Digit 6.4 cm      Left Upper Extremity Lymphedema   10 cm Proximal to Olecranon Process 27.5 cm    Olecranon Process 23.2 cm    10 cm Proximal to Ulnar Styloid Process 18.5 cm    Just Proximal to Ulnar Styloid Process 14.1 cm    Across Hand at [employer] 18.5 cm    At [place] of 2nd Digit 6.3 cm                Quick Dash - 01/08/21 0001     Open a tight or new jar Mild difficulty    Do heavy household chores (wash walls, wash floors) No difficulty    Carry a shopping bag or briefcase No difficulty    Wash your back No difficulty    Use a knife to cut food No difficulty    Recreational activities in which you take some force or impact through your arm, shoulder, or hand (golf, hammering, tennis) No difficulty    During the past week, to what extent has your arm, shoulder or hand problem interfered with your normal social activities with family, friends, neighbors, or groups? Not at all    During the past week, to what extent has your arm, shoulder or hand problem limited your work or other regular daily activities Not at all    Arm, shoulder, or hand pain. None    Tingling (pins and needles) in your arm, shoulder, or hand None    Difficulty Sleeping No difficulty    DASH Score 2.27 %                             PT Education - 01/08/21 1052     Education Details Lymphedema education and post op HEP    Person(s) Educated Patient    Methods Explanation;Demonstration;Handout    Comprehension Returned demonstration;Verbalized understanding                 PT Long Term Goals - 01/08/21 1158       PT LONG TERM GOAL #1   Title Patient will dmeonstrate she has regained full shoulder  ROM and function post operatively compared to baelines.    Time 8    Period Weeks    Status Achieved                   Plan - 01/08/21 1053     Clinical Impression Statement Patient is healing well s/p right lumpectomy and sentinel node biopsy on 12/03/2020 followed by a bilateral reduction and lift on 12/09/2020. She has regained full shoulder ROM and function, has no signs of lymphedema, and her incisions appear to be healing well. She is eager to begin/finish radiation. She has no need for PT at this time other than the education she was given today.    Stability/Clinical Decision Making Stable/Uncomplicated    PT Treatment/Interventions Therapeutic exercise;ADLs/Self Care Home Management;Patient/family education    PT Next Visit Plan D/C - will continue with SOZO screens    PT Home Exercise Plan HEP    Consulted and Agree with Plan of Care Patient             Patient will benefit from skilled therapeutic intervention in order to improve the following deficits and impairments:  Postural dysfunction, Decreased  range of motion, Decreased knowledge of precautions, Impaired UE functional use, Pain  Visit Diagnosis: Malignant neoplasm of lower-inner quadrant of right breast of female, estrogen receptor positive [facility])  Aftercare following surgery for neoplasm  Abnormal posture     Problem List Patient Active Problem List   Diagnosis Date Noted   Malignant neoplasm of lower-inner quadrant of right breast of female, estrogen receptor positive ([place]) 10/24/2020   Anxiety 10/28/2016   Allergic rhinitis 10/28/2016   HTN (hypertension) 10/28/2016   Constipation 10/28/2016   Insomnia 10/28/2016   Vitamin D deficiency 10/28/2016   Hyponatremia 10/26/2016   Primary osteoarthritis of right knee 08/28/2015    PHYSICAL THERAPY DISCHARGE SUMMARY  Visits from Start of Care: 2  Current functional level related to goals / functional outcomes: Goals met. See above for  objective measurements.   Remaining deficits: None   Education / Equipment: HEP and lymphedema education  Patient agrees to discharge. Patient goals were met. Patient is being discharged due to meeting the stated rehab goals.  Annia Friendly, [place] 01/08/21 11:59 AM   [facility] @ [place], [place], [address] Phone: [phone]   Fax:  [phone]  Name: Thena Devora MRN: [id number] Date of Birth: 01/22/1950

## 2021-01-08 NOTE — Patient Instructions (Addendum)
° ° °   [facility]  [address], [address]  [place] [address]  [phone]  After Breast Cancer Class It is recommended you attend the ABC class to be educated on lymphedema risk reduction. This class is free of charge and lasts for 1 hour. It is a 1-time class. You will need to download the Webex app either on your phone or computer. We will send you a link the night before or the morning of the class. You should be able to click on that link to join the class. This is not a confidential class. You don't have to turn your camera on, but other participants may be able to see your email address. You are scheduled for February 09, 2021.  Scar massage You can begin gentle scar massage to you incision sites. Gently place one hand on the incision and move the skin (without sliding on the skin) in various directions. Do this for a few minutes and then you can gently massage either coconut oil or vitamin E cream into the scars. You mentioned Dr. Iran Planas recommended Aquafor so that is fine.  Compression garment You should continue wearing your compression bra until you feel like you no longer have swelling.  Home exercise Program Continue doing the exercises you were given until you feel like you can do them without feeling any tightness at the end.   Walking Program Studies show that 30 minutes of walking per day (fast enough to elevate your heart rate) can significantly reduce the risk of a cancer recurrence. If you can't walk due to other medical reasons, we encourage you to find another activity you could do (like a stationary bike or water exercise).  Posture After breast cancer surgery, people frequently sit with rounded shoulders posture because it puts their incisions on slack and feels better. If you sit like this and scar tissue forms in that position, you can become very tight and have pain sitting or standing with good posture. Try to be aware of your posture and  sit and stand up tall to heal properly.  Follow up PT: It is recommended you return every 3 months for the first 3 years following surgery to be assessed on the SOZO machine for an L-Dex score. This helps prevent clinically significant lymphedema in 95% of patients. These follow up screens are 10 minute appointments that you are not billed for. You are scheduled for February 27th at 1:00.

## 2021-01-13 NOTE — Progress Notes (Signed)
Location of Breast Cancer: lower-inner quadrant of right breast   Histology per Pathology Report:  1. Breast, right, needle core biopsy, 6 o'clock mass - INVASIVE MAMMARY CARCINOMA. SEE NOTE - MAMMARY CARCINOMA IN SITU 2. Breast, right, needle core biopsy, 4 o'clock mass - INVASIVE MAMMARY CARCINOMA. SEE NOTE 3. Breast, right, needle core biopsy, 5 o'clock mass - INVASIVE MAMMARY CARCINOMA. SEE NOTE - MAMMARY CARCINOMA IN SITU  Receptor Status:  The tumor cells are NEGATIVE for Her2 (1+). Estrogen Receptor: 100%, POSITIVE, STRONG STAINING INTENSITY Progesterone Receptor: 50%, POSITIVE, MODERATE STAINING INTENSITY Proliferation Marker Ki67: 5%  Did patient present with symptoms (if so, please note symptoms) or was this found on screening mammography?: found on screening mammogram  Past/Anticipated interventions by surgeon, if any:  Procedure: 1.  Right breast radioactive seed bracketed lumpectomy 2.  Injection of mag trace for sentinel lymph node identification 3.  Right deep axillary sentinel lymph node biopsy Surgeon: Dr. Serita Grammes  PROCEDURE:  Right oncoplastic breast reconstruction left breast reduction SURGEON: Irene Limbo MD MBA  Past/Anticipated interventions by medical oncology, if any: Dr Burr Medico If her surgical sentinel lymph node node positive, I recommend mammaprint for further risk stratification and guide adjuvant chemotherapy. -Giving the strong ER and PR expression in her postmenopausal status, I recommend adjuvant endocrine therapy with aromatase inhibitor for a total of 5-10 years to reduce the risk of cancer recurrence. Given the lobular histology, I will likely do 10 years if she tolerates well.  Lymphedema issues, if any:  no    Pain issues, if any:  no   SAFETY ISSUES: Prior radiation? no Pacemaker/ICD? no Possible current pregnancy?no, postmenopausal Is the patient on methotrexate? no  Current Complaints / other details:  none    Vitals:    01/19/21 1335  BP: 133/89  Pulse: 98  Temp: 97.8 F (36.6 C)  SpO2: 100%  Weight: 151 lb 6.4 oz (68.7 kg)  Height: _0  (1.626 m)

## 2021-01-18 NOTE — Progress Notes (Signed)
Radiation Oncology         [phone] ________________________________  Name: [name] MRN: [id number]  Date: 01/19/2021  DOB: 1950-11-15  Re-Evaluation Note  CC: [name], Joline Maxcy, MD    ICD-10-CM   1. 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 She was seen in the multidisciplinary breast clinic on 10/29/20.   She opted to proceed with right breast lumpectomy and nodal biopsies on 12/03/20 under the care of Dr. Donne Hazel. Pathology from the procedure revealed: grade 2 multifocal invasive lobular carcinoma measuring 0.9 cm, and lobular carcinoma in-situ. All margins uninvolved by carcinoma, though focal residual invasive and in-situ carcinoma were identified in the right medial margin excision, and focal residual lobular carcinoma in-situ from right inferior margin excision. Nodal status of 4/4 right axillary sentinel lymph node excisions negative for carcinoma, and 1/4 right axillary sentinel lymph nodes with isolated tumor cells present.  Prognotic indicators significant for: ER status 100% positive with strong staining intensity; PR status 50% positive with moderate staining intensity; Her2 status negative; proliferation marker Ki67 at 5%; Grade 2.  The closest microscopic margin was anteriorly at 3 mm.  Oncotype DX was obtained on the final surgical sample and the recurrence score of 23 predicts a risk of recurrence outside the breast over the next 9 years of 9%, if the patient's only systemic therapy is an antiestrogen for 5 years.  It also predicts no significant benefit from chemotherapy.  The patient also underwent bilateral  mammoplasties on 12/09/20 under the care of Dr. Iran Planas. Pathology from right mammoplasty revealed: no evidence of DCIS and IDC; a small complex sclerosing lesion with a focus of ADH (5 mm); fibrocystic changes with usual epithelial hyperplasia and foci of intraluminal microcalcification. Pathology from right mammoplasty revealed: no evidence of ADH, ALH, DCIS, and IDC; and fibrocystic changes without epithelial hyperplasia.  The patient was noted to be doing well post-op from both procedures by Dr. Donne Hazel and Dr. Iran Planas.   Based on Oncotype results, the patient will follow up with Dr. Burr Medico this month to discuss adjuvant chemotherapy options. Per visit with Dr. Burr Medico on 10/29/20, the patient was also recommended adjuvant endocrine therapy with aromatase inhibitors for a total of 5-10 years to reduce the risk of cancer recurrence given the strong ER and PR expression. (Given the patient's lobular histology, Dr. Burr Medico would like to do 10 years if she tolerates AI well).   Pertinent imaging performed since the patient was last seen includes:  --DXA for bone density on 11/05/20 revealed a right femoral neck T-score of -1.2, indicating the patient to have low bone mass   Of note: the patient presented to the ED on 12/02/20 following a fall occurring earlier that day which resulted in a laceration to her right lateral eyebrow area. Head, maxillofacial, and cervical spine CT's were all negative for any acute findings. However, imaging incidentally revealed multiple pulmonary nodules. The most severe of which was appreciated to measure 3 mm, located within the right upper lobe. Per ED notes, the patient will follow up with her PCP in respects to this finding. Otherwise, the patient received sutures and was discharged  home without complication.   On review of systems, the patient reports some soreness in both breast from her recent breast reductions. She denies numbness or swelling in her right arm or hand.   And any other symptoms.    Allergies:  is allergic to codeine and lisinopril.  Meds: Current Outpatient Medications  Medication Sig Dispense Refill   ALPRAZolam (XANAX) 0.5 MG tablet Take 0.5 mg by mouth 2 (two) times daily as needed for anxiety.     azelastine (ASTELIN) 0.1 % nasal spray Place 2 sprays into both nostrils 2 (two) times daily. Use in each nostril as directed     buPROPion (WELLBUTRIN XL) 150 MG 24 hr tablet Take 450 mg by mouth daily.      calcium carbonate (OS-CAL - DOSED IN MG OF ELEMENTAL CALCIUM) 1250 (500 Ca) MG tablet Take by mouth.     cetirizine (ZYRTEC) 10 MG tablet Take 10 mg by mouth at bedtime.      Cholecalciferol 50 MCG (2000 UT) TABS 1 tablet     demeclocycline (DECLOMYCIN) 300 MG tablet Take 1 tablet (300 mg total) by mouth daily. 90 tablet 3   desvenlafaxine (PRISTIQ) 50 MG 24 hr tablet Take 50 mg by mouth daily.      lansoprazole (PREVACID) 15 MG capsule Take 15 mg by mouth daily. At 6AM     Melatonin 10 MG TABS Take 10 mg by mouth at bedtime.     rosuvastatin (CRESTOR) 5 MG tablet Take 1 tablet (5 mg total) by mouth daily. 90 tablet 3   telmisartan (MICARDIS) 80 MG tablet Take 80 mg by mouth daily.     Vitamin D, Ergocalciferol, (DRISDOL) 50000 units CAPS capsule Take 2,000 Units by mouth daily.      zolpidem (AMBIEN) 10 MG tablet Take 10 mg by mouth at bedtime.     traMADol (ULTRAM) 50 MG tablet Take 1 tablet (50 mg total) by mouth every 6 (six) hours as needed. (Patient not taking: Reported on 01/19/2021) 15 tablet 0   No current facility-administered medications for this encounter.    Physical Findings: The patient is in no acute distress. Patient is alert and oriented.  height is $RemoveB'5\' 4"'JfxUiHCg$  (1.626 m) and weight is 151 lb 6.4 oz (68.7 kg). Her temperature is 97.8 F (36.6 C). Her blood pressure is 133/89 and her pulse is 98. Her oxygen saturation is 100%.  No significant changes. Lungs are clear to auscultation bilaterally. Heart has regular rate and  rhythm. No palpable cervical, supraclavicular, or axillary adenopathy. Abdomen soft, non-tender, normal bowel sounds. Left breast: no palpable mass, nipple discharge or bleeding.  Scars noted from breast reduction.  Some eschar noted along the inferior scar.  No signs of drainage or infection Right breast: Scars noted from breast reduction.  Some edema noted in the nipple areolar complex area.  No signs of drainage or infection  Lab Findings: Lab Results  Component Value Date   WBC 6.2 10/29/2020   HGB 13.6 10/29/2020   HCT 39.9 10/29/2020   MCV 93.4 10/29/2020   PLT 352 10/29/2020    Radiographic Findings: No results found.  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  I discussed the general course of radiation therapy anticipated side effects and potential  long-term toxicities of right breast radiation therapy.  She appears to understand and wishes to proceed with planned course of treatment.  Given her extensive surgery I would not recommend hypofractionated accelerated radiation therapy in this situation.  She agrees.  She will receive approximately 6 and half weeks of conventional dose radiation therapy.  Plan:  Patient is scheduled for CT simulation January 18.  She will receive 5-1/2 weeks of radiation therapy directed at the right breast area.  She will then proceed with a boost to the lumpectomy cavity if the lumpectomy cavity can be identified.  The lumpectomy cavity may be difficult to see since she proceeded to undergo a right oncoplastic breast reconstruction.  -----------------------------------  Blair Promise, PhD, MD  This document serves as a record of services personally performed by [name], MD. It was  created on his behalf by Roney Mans, a trained medical scribe. The creation of this record is based on the scribe's personal observations and the provider's statements to them. This document has been checked and approved by the attending provider.

## 2021-01-19 ENCOUNTER — Other Ambulatory Visit: Payer: Self-pay

## 2021-01-19 ENCOUNTER — Encounter: Payer: Self-pay | Admitting: Radiation Oncology

## 2021-01-19 ENCOUNTER — Ambulatory Visit
Admission: RE | Admit: 2021-01-19 | Discharge: 2021-01-19 | Disposition: A | Discharge status: Home / Self Care | Payer: Medicare PPO | Source: Ambulatory Visit | Attending: Radiation Oncology | Admitting: Radiation Oncology

## 2021-01-19 VITALS — BP 133/89 | HR 98 | Temp 97.8°F | Ht 64.0 in | Wt 151.4 lb

## 2021-01-19 DIAGNOSIS — R918 Other nonspecific abnormal finding of lung field: Secondary | ICD-10-CM | POA: Insufficient documentation

## 2021-01-19 DIAGNOSIS — C50311 Malignant neoplasm of lower-inner quadrant of right female breast: Secondary | ICD-10-CM | POA: Diagnosis not present

## 2021-01-19 DIAGNOSIS — M85851 Other specified disorders of bone density and structure, right thigh: Secondary | ICD-10-CM | POA: Diagnosis not present

## 2021-01-19 DIAGNOSIS — Z170 Estrogen receptor positive status [ER+]: Secondary | ICD-10-CM

## 2021-01-19 DIAGNOSIS — Z79899 Other long term (current) drug therapy: Secondary | ICD-10-CM | POA: Insufficient documentation

## 2021-01-19 NOTE — Progress Notes (Signed)
See MD note for nursing evaluation. °

## 2021-01-21 ENCOUNTER — Ambulatory Visit
Admission: RE | Admit: 2021-01-21 | Discharge: 2021-01-21 | Disposition: A | Discharge status: Home / Self Care | Payer: Medicare PPO | Source: Ambulatory Visit | Attending: Radiation Oncology | Admitting: Radiation Oncology

## 2021-01-21 ENCOUNTER — Other Ambulatory Visit: Payer: Self-pay

## 2021-01-21 DIAGNOSIS — Z170 Estrogen receptor positive status [ER+]: Secondary | ICD-10-CM | POA: Insufficient documentation

## 2021-01-21 DIAGNOSIS — Z510 Encounter for antineoplastic radiation therapy: Secondary | ICD-10-CM | POA: Insufficient documentation

## 2021-01-21 DIAGNOSIS — C50311 Malignant neoplasm of lower-inner quadrant of right female breast: Secondary | ICD-10-CM | POA: Diagnosis not present

## 2021-01-23 ENCOUNTER — Telehealth: Payer: Self-pay | Admitting: Hematology

## 2021-01-23 ENCOUNTER — Encounter: Payer: Self-pay | Admitting: *Deleted

## 2021-01-23 NOTE — Telephone Encounter (Signed)
Scheduled appointment per 1/20 scheduling message. Left message.

## 2021-01-26 ENCOUNTER — Ambulatory Visit: Payer: Medicare PPO | Admitting: Endocrinology

## 2021-01-27 DIAGNOSIS — Z510 Encounter for antineoplastic radiation therapy: Secondary | ICD-10-CM | POA: Diagnosis not present

## 2021-01-27 DIAGNOSIS — C50311 Malignant neoplasm of lower-inner quadrant of right female breast: Secondary | ICD-10-CM | POA: Diagnosis not present

## 2021-01-27 DIAGNOSIS — Z170 Estrogen receptor positive status [ER+]: Secondary | ICD-10-CM | POA: Diagnosis not present

## 2021-01-29 ENCOUNTER — Other Ambulatory Visit: Payer: Self-pay

## 2021-01-29 ENCOUNTER — Telehealth: Payer: Self-pay | Admitting: Radiology

## 2021-01-29 ENCOUNTER — Ambulatory Visit
Admission: RE | Admit: 2021-01-29 | Discharge: 2021-01-29 | Disposition: A | Discharge status: Home / Self Care | Payer: Medicare PPO | Source: Ambulatory Visit | Attending: Radiation Oncology | Admitting: Radiation Oncology

## 2021-01-29 DIAGNOSIS — Z170 Estrogen receptor positive status [ER+]: Secondary | ICD-10-CM | POA: Diagnosis not present

## 2021-01-29 DIAGNOSIS — C50311 Malignant neoplasm of lower-inner quadrant of right female breast: Secondary | ICD-10-CM | POA: Diagnosis not present

## 2021-01-29 DIAGNOSIS — Z510 Encounter for antineoplastic radiation therapy: Secondary | ICD-10-CM | POA: Diagnosis not present

## 2021-01-29 NOTE — Telephone Encounter (Signed)
Patient states she is taking declomycin for low sodium and one of the side effects is being sensitive to the sun. She asks should she stop taking it & talk to the provider that prescribed it?

## 2021-01-29 NOTE — Telephone Encounter (Signed)
Per Dr Sondra Come, advised patient to continue medication and she will be monitored for skin changes during treatment. Patient verbalized understanding.

## 2021-01-30 ENCOUNTER — Other Ambulatory Visit: Payer: Self-pay

## 2021-01-30 ENCOUNTER — Ambulatory Visit
Admission: RE | Admit: 2021-01-30 | Discharge: 2021-01-30 | Disposition: A | Discharge status: Home / Self Care | Payer: Medicare PPO | Source: Ambulatory Visit | Attending: Radiation Oncology | Admitting: Radiation Oncology

## 2021-01-30 DIAGNOSIS — Z510 Encounter for antineoplastic radiation therapy: Secondary | ICD-10-CM | POA: Diagnosis not present

## 2021-01-30 DIAGNOSIS — C50311 Malignant neoplasm of lower-inner quadrant of right female breast: Secondary | ICD-10-CM | POA: Diagnosis not present

## 2021-01-30 DIAGNOSIS — Z170 Estrogen receptor positive status [ER+]: Secondary | ICD-10-CM | POA: Diagnosis not present

## 2021-02-02 ENCOUNTER — Other Ambulatory Visit: Payer: Self-pay

## 2021-02-02 ENCOUNTER — Ambulatory Visit
Admission: RE | Admit: 2021-02-02 | Discharge: 2021-02-02 | Disposition: A | Discharge status: Home / Self Care | Payer: Medicare PPO | Source: Ambulatory Visit | Attending: Radiation Oncology | Admitting: Radiation Oncology

## 2021-02-02 DIAGNOSIS — Z510 Encounter for antineoplastic radiation therapy: Secondary | ICD-10-CM | POA: Diagnosis not present

## 2021-02-02 DIAGNOSIS — C50311 Malignant neoplasm of lower-inner quadrant of right female breast: Secondary | ICD-10-CM | POA: Diagnosis not present

## 2021-02-02 DIAGNOSIS — Z170 Estrogen receptor positive status [ER+]: Secondary | ICD-10-CM | POA: Diagnosis not present

## 2021-02-03 ENCOUNTER — Ambulatory Visit
Admission: RE | Admit: 2021-02-03 | Discharge: 2021-02-03 | Disposition: A | Discharge status: Home / Self Care | Payer: Medicare PPO | Source: Ambulatory Visit | Attending: Radiation Oncology | Admitting: Radiation Oncology

## 2021-02-03 DIAGNOSIS — C50311 Malignant neoplasm of lower-inner quadrant of right female breast: Secondary | ICD-10-CM | POA: Diagnosis not present

## 2021-02-03 DIAGNOSIS — Z170 Estrogen receptor positive status [ER+]: Secondary | ICD-10-CM | POA: Diagnosis not present

## 2021-02-03 DIAGNOSIS — Z510 Encounter for antineoplastic radiation therapy: Secondary | ICD-10-CM | POA: Diagnosis not present

## 2021-02-03 MED ORDER — RADIAPLEXRX EX GEL: Freq: Once | CUTANEOUS | Status: AC

## 2021-02-04 ENCOUNTER — Ambulatory Visit
Admission: RE | Admit: 2021-02-04 | Discharge: 2021-02-04 | Disposition: A | Discharge status: Home / Self Care | Payer: Medicare PPO | Source: Ambulatory Visit | Attending: Radiation Oncology | Admitting: Radiation Oncology

## 2021-02-04 ENCOUNTER — Other Ambulatory Visit: Payer: Self-pay

## 2021-02-04 DIAGNOSIS — C50311 Malignant neoplasm of lower-inner quadrant of right female breast: Secondary | ICD-10-CM | POA: Insufficient documentation

## 2021-02-04 DIAGNOSIS — Z170 Estrogen receptor positive status [ER+]: Secondary | ICD-10-CM | POA: Diagnosis not present

## 2021-02-05 ENCOUNTER — Ambulatory Visit
Admission: RE | Admit: 2021-02-05 | Discharge: 2021-02-05 | Disposition: A | Discharge status: Home / Self Care | Payer: Medicare PPO | Source: Ambulatory Visit | Attending: Radiation Oncology | Admitting: Radiation Oncology

## 2021-02-05 DIAGNOSIS — Z170 Estrogen receptor positive status [ER+]: Secondary | ICD-10-CM | POA: Diagnosis not present

## 2021-02-05 DIAGNOSIS — C50311 Malignant neoplasm of lower-inner quadrant of right female breast: Secondary | ICD-10-CM | POA: Diagnosis not present

## 2021-02-06 ENCOUNTER — Other Ambulatory Visit: Payer: Self-pay

## 2021-02-06 ENCOUNTER — Ambulatory Visit
Admission: RE | Admit: 2021-02-06 | Discharge: 2021-02-06 | Disposition: A | Discharge status: Home / Self Care | Payer: Medicare PPO | Source: Ambulatory Visit | Attending: Radiation Oncology | Admitting: Radiation Oncology

## 2021-02-06 DIAGNOSIS — C50311 Malignant neoplasm of lower-inner quadrant of right female breast: Secondary | ICD-10-CM | POA: Diagnosis not present

## 2021-02-06 DIAGNOSIS — Z170 Estrogen receptor positive status [ER+]: Secondary | ICD-10-CM | POA: Diagnosis not present

## 2021-02-09 ENCOUNTER — Other Ambulatory Visit: Payer: Self-pay

## 2021-02-09 ENCOUNTER — Ambulatory Visit
Admission: RE | Admit: 2021-02-09 | Discharge: 2021-02-09 | Disposition: A | Discharge status: Home / Self Care | Payer: Medicare PPO | Source: Ambulatory Visit | Attending: Radiation Oncology | Admitting: Radiation Oncology

## 2021-02-09 DIAGNOSIS — Z170 Estrogen receptor positive status [ER+]: Secondary | ICD-10-CM | POA: Diagnosis not present

## 2021-02-09 DIAGNOSIS — C50311 Malignant neoplasm of lower-inner quadrant of right female breast: Secondary | ICD-10-CM | POA: Diagnosis not present

## 2021-02-10 ENCOUNTER — Ambulatory Visit
Admission: RE | Admit: 2021-02-10 | Discharge: 2021-02-10 | Disposition: A | Discharge status: Home / Self Care | Payer: Medicare PPO | Source: Ambulatory Visit | Attending: Radiation Oncology | Admitting: Radiation Oncology

## 2021-02-10 DIAGNOSIS — Z170 Estrogen receptor positive status [ER+]: Secondary | ICD-10-CM | POA: Diagnosis not present

## 2021-02-10 DIAGNOSIS — C50311 Malignant neoplasm of lower-inner quadrant of right female breast: Secondary | ICD-10-CM | POA: Diagnosis not present

## 2021-02-11 ENCOUNTER — Other Ambulatory Visit: Payer: Self-pay

## 2021-02-11 ENCOUNTER — Ambulatory Visit
Admission: RE | Admit: 2021-02-11 | Discharge: 2021-02-11 | Disposition: A | Discharge status: Home / Self Care | Payer: Medicare PPO | Source: Ambulatory Visit | Attending: Radiation Oncology | Admitting: Radiation Oncology

## 2021-02-11 DIAGNOSIS — C50311 Malignant neoplasm of lower-inner quadrant of right female breast: Secondary | ICD-10-CM | POA: Diagnosis not present

## 2021-02-11 DIAGNOSIS — Z170 Estrogen receptor positive status [ER+]: Secondary | ICD-10-CM | POA: Diagnosis not present

## 2021-02-12 ENCOUNTER — Ambulatory Visit
Admission: RE | Admit: 2021-02-12 | Discharge: 2021-02-12 | Disposition: A | Discharge status: Home / Self Care | Payer: Medicare PPO | Source: Ambulatory Visit | Attending: Radiation Oncology | Admitting: Radiation Oncology

## 2021-02-12 DIAGNOSIS — Z170 Estrogen receptor positive status [ER+]: Secondary | ICD-10-CM | POA: Diagnosis not present

## 2021-02-12 DIAGNOSIS — C50311 Malignant neoplasm of lower-inner quadrant of right female breast: Secondary | ICD-10-CM | POA: Diagnosis not present

## 2021-02-13 ENCOUNTER — Ambulatory Visit
Admission: RE | Admit: 2021-02-13 | Discharge: 2021-02-13 | Disposition: A | Discharge status: Home / Self Care | Payer: Medicare PPO | Source: Ambulatory Visit | Attending: Radiation Oncology | Admitting: Radiation Oncology

## 2021-02-13 DIAGNOSIS — C50311 Malignant neoplasm of lower-inner quadrant of right female breast: Secondary | ICD-10-CM | POA: Diagnosis not present

## 2021-02-13 DIAGNOSIS — Z170 Estrogen receptor positive status [ER+]: Secondary | ICD-10-CM | POA: Diagnosis not present

## 2021-02-16 ENCOUNTER — Ambulatory Visit
Admission: RE | Admit: 2021-02-16 | Discharge: 2021-02-16 | Disposition: A | Discharge status: Home / Self Care | Payer: Medicare PPO | Source: Ambulatory Visit | Attending: Radiation Oncology | Admitting: Radiation Oncology

## 2021-02-16 ENCOUNTER — Other Ambulatory Visit: Payer: Self-pay

## 2021-02-16 DIAGNOSIS — Z170 Estrogen receptor positive status [ER+]: Secondary | ICD-10-CM | POA: Diagnosis not present

## 2021-02-16 DIAGNOSIS — C50311 Malignant neoplasm of lower-inner quadrant of right female breast: Secondary | ICD-10-CM | POA: Diagnosis not present

## 2021-02-17 ENCOUNTER — Ambulatory Visit: Payer: Medicare PPO | Admitting: Radiation Oncology

## 2021-02-17 ENCOUNTER — Ambulatory Visit
Admission: RE | Admit: 2021-02-17 | Discharge: 2021-02-17 | Disposition: A | Discharge status: Home / Self Care | Payer: Medicare PPO | Source: Ambulatory Visit | Attending: Radiation Oncology | Admitting: Radiation Oncology

## 2021-02-17 DIAGNOSIS — C50311 Malignant neoplasm of lower-inner quadrant of right female breast: Secondary | ICD-10-CM | POA: Diagnosis not present

## 2021-02-17 DIAGNOSIS — Z170 Estrogen receptor positive status [ER+]: Secondary | ICD-10-CM | POA: Diagnosis not present

## 2021-02-18 ENCOUNTER — Other Ambulatory Visit: Payer: Self-pay

## 2021-02-18 ENCOUNTER — Ambulatory Visit
Admission: RE | Admit: 2021-02-18 | Discharge: 2021-02-18 | Disposition: A | Discharge status: Home / Self Care | Payer: Medicare PPO | Source: Ambulatory Visit | Attending: Radiation Oncology | Admitting: Radiation Oncology

## 2021-02-18 DIAGNOSIS — C50311 Malignant neoplasm of lower-inner quadrant of right female breast: Secondary | ICD-10-CM | POA: Diagnosis not present

## 2021-02-18 DIAGNOSIS — Z170 Estrogen receptor positive status [ER+]: Secondary | ICD-10-CM | POA: Diagnosis not present

## 2021-02-19 ENCOUNTER — Ambulatory Visit
Admission: RE | Admit: 2021-02-19 | Discharge: 2021-02-19 | Disposition: A | Discharge status: Home / Self Care | Payer: Medicare PPO | Source: Ambulatory Visit | Attending: Radiation Oncology | Admitting: Radiation Oncology

## 2021-02-19 DIAGNOSIS — Z170 Estrogen receptor positive status [ER+]: Secondary | ICD-10-CM | POA: Diagnosis not present

## 2021-02-19 DIAGNOSIS — C50311 Malignant neoplasm of lower-inner quadrant of right female breast: Secondary | ICD-10-CM | POA: Diagnosis not present

## 2021-02-20 ENCOUNTER — Ambulatory Visit
Admission: RE | Admit: 2021-02-20 | Discharge: 2021-02-20 | Disposition: A | Discharge status: Home / Self Care | Payer: Medicare PPO | Source: Ambulatory Visit | Attending: Radiation Oncology | Admitting: Radiation Oncology

## 2021-02-20 ENCOUNTER — Other Ambulatory Visit: Payer: Self-pay

## 2021-02-20 DIAGNOSIS — C50311 Malignant neoplasm of lower-inner quadrant of right female breast: Secondary | ICD-10-CM | POA: Diagnosis not present

## 2021-02-20 DIAGNOSIS — Z170 Estrogen receptor positive status [ER+]: Secondary | ICD-10-CM | POA: Diagnosis not present

## 2021-02-23 ENCOUNTER — Ambulatory Visit
Admission: RE | Admit: 2021-02-23 | Discharge: 2021-02-23 | Disposition: A | Discharge status: Home / Self Care | Payer: Medicare PPO | Source: Ambulatory Visit | Attending: Radiation Oncology | Admitting: Radiation Oncology

## 2021-02-23 ENCOUNTER — Other Ambulatory Visit: Payer: Self-pay

## 2021-02-23 DIAGNOSIS — C50311 Malignant neoplasm of lower-inner quadrant of right female breast: Secondary | ICD-10-CM | POA: Diagnosis not present

## 2021-02-23 DIAGNOSIS — Z170 Estrogen receptor positive status [ER+]: Secondary | ICD-10-CM | POA: Diagnosis not present

## 2021-02-24 ENCOUNTER — Ambulatory Visit
Admission: RE | Admit: 2021-02-24 | Discharge: 2021-02-24 | Disposition: A | Discharge status: Home / Self Care | Payer: Medicare PPO | Source: Ambulatory Visit | Attending: Radiation Oncology | Admitting: Radiation Oncology

## 2021-02-24 ENCOUNTER — Ambulatory Visit: Payer: Medicare PPO | Admitting: Radiation Oncology

## 2021-02-24 DIAGNOSIS — Z170 Estrogen receptor positive status [ER+]: Secondary | ICD-10-CM

## 2021-02-24 DIAGNOSIS — C50311 Malignant neoplasm of lower-inner quadrant of right female breast: Secondary | ICD-10-CM | POA: Diagnosis not present

## 2021-02-24 MED ORDER — RADIAPLEXRX EX GEL: Freq: Once | CUTANEOUS | Status: AC

## 2021-02-25 ENCOUNTER — Ambulatory Visit
Admission: RE | Admit: 2021-02-25 | Discharge: 2021-02-25 | Disposition: A | Discharge status: Home / Self Care | Payer: Medicare PPO | Source: Ambulatory Visit | Attending: Radiation Oncology | Admitting: Radiation Oncology

## 2021-02-25 ENCOUNTER — Other Ambulatory Visit: Payer: Self-pay

## 2021-02-25 DIAGNOSIS — C50311 Malignant neoplasm of lower-inner quadrant of right female breast: Secondary | ICD-10-CM | POA: Diagnosis not present

## 2021-02-25 DIAGNOSIS — Z170 Estrogen receptor positive status [ER+]: Secondary | ICD-10-CM | POA: Diagnosis not present

## 2021-02-26 ENCOUNTER — Ambulatory Visit
Admission: RE | Admit: 2021-02-26 | Discharge: 2021-02-26 | Disposition: A | Discharge status: Home / Self Care | Payer: Medicare PPO | Source: Ambulatory Visit | Attending: Radiation Oncology | Admitting: Radiation Oncology

## 2021-02-26 DIAGNOSIS — Z170 Estrogen receptor positive status [ER+]: Secondary | ICD-10-CM | POA: Diagnosis not present

## 2021-02-26 DIAGNOSIS — C50311 Malignant neoplasm of lower-inner quadrant of right female breast: Secondary | ICD-10-CM | POA: Diagnosis not present

## 2021-02-27 ENCOUNTER — Other Ambulatory Visit: Payer: Self-pay | Admitting: *Deleted

## 2021-02-27 ENCOUNTER — Ambulatory Visit
Admission: RE | Admit: 2021-02-27 | Discharge: 2021-02-27 | Disposition: A | Discharge status: Home / Self Care | Payer: Medicare PPO | Source: Ambulatory Visit | Attending: Radiation Oncology | Admitting: Radiation Oncology

## 2021-02-27 ENCOUNTER — Other Ambulatory Visit: Payer: Self-pay

## 2021-02-27 DIAGNOSIS — R55 Syncope and collapse: Secondary | ICD-10-CM

## 2021-02-27 DIAGNOSIS — I77810 Thoracic aortic ectasia: Secondary | ICD-10-CM

## 2021-02-27 DIAGNOSIS — C50311 Malignant neoplasm of lower-inner quadrant of right female breast: Secondary | ICD-10-CM | POA: Diagnosis not present

## 2021-02-27 DIAGNOSIS — Z8249 Family history of ischemic heart disease and other diseases of the circulatory system: Secondary | ICD-10-CM

## 2021-02-27 DIAGNOSIS — Z170 Estrogen receptor positive status [ER+]: Secondary | ICD-10-CM | POA: Diagnosis not present

## 2021-02-27 DIAGNOSIS — E782 Mixed hyperlipidemia: Secondary | ICD-10-CM

## 2021-03-02 ENCOUNTER — Ambulatory Visit
Admission: RE | Admit: 2021-03-02 | Discharge: 2021-03-02 | Disposition: A | Discharge status: Home / Self Care | Payer: Medicare PPO | Source: Ambulatory Visit | Attending: Radiation Oncology | Admitting: Radiation Oncology

## 2021-03-02 ENCOUNTER — Other Ambulatory Visit: Payer: Self-pay

## 2021-03-02 ENCOUNTER — Ambulatory Visit: Payer: Medicare PPO

## 2021-03-02 ENCOUNTER — Ambulatory Visit: Payer: Self-pay | Admitting: Physical Therapy

## 2021-03-02 DIAGNOSIS — C50311 Malignant neoplasm of lower-inner quadrant of right female breast: Secondary | ICD-10-CM | POA: Diagnosis not present

## 2021-03-02 DIAGNOSIS — Z170 Estrogen receptor positive status [ER+]: Secondary | ICD-10-CM | POA: Diagnosis not present

## 2021-03-03 ENCOUNTER — Ambulatory Visit
Admission: RE | Admit: 2021-03-03 | Discharge: 2021-03-03 | Disposition: A | Discharge status: Home / Self Care | Payer: Medicare PPO | Source: Ambulatory Visit | Attending: Radiation Oncology | Admitting: Radiation Oncology

## 2021-03-03 ENCOUNTER — Encounter (HOSPITAL_COMMUNITY): Payer: Self-pay

## 2021-03-03 ENCOUNTER — Ambulatory Visit: Payer: Medicare PPO

## 2021-03-03 ENCOUNTER — Encounter: Payer: Self-pay | Admitting: *Deleted

## 2021-03-03 DIAGNOSIS — Z170 Estrogen receptor positive status [ER+]: Secondary | ICD-10-CM

## 2021-03-03 DIAGNOSIS — C50311 Malignant neoplasm of lower-inner quadrant of right female breast: Secondary | ICD-10-CM | POA: Diagnosis not present

## 2021-03-04 ENCOUNTER — Ambulatory Visit
Admission: RE | Admit: 2021-03-04 | Discharge: 2021-03-04 | Disposition: A | Discharge status: Home / Self Care | Payer: Medicare PPO | Source: Ambulatory Visit | Attending: Radiation Oncology | Admitting: Radiation Oncology

## 2021-03-04 ENCOUNTER — Other Ambulatory Visit: Payer: Self-pay

## 2021-03-04 DIAGNOSIS — Z170 Estrogen receptor positive status [ER+]: Secondary | ICD-10-CM | POA: Diagnosis not present

## 2021-03-04 DIAGNOSIS — C50311 Malignant neoplasm of lower-inner quadrant of right female breast: Secondary | ICD-10-CM | POA: Insufficient documentation

## 2021-03-05 ENCOUNTER — Inpatient Hospital Stay: Payer: Medicare PPO | Admitting: Hematology

## 2021-03-05 ENCOUNTER — Encounter: Payer: Self-pay | Admitting: Hematology

## 2021-03-05 ENCOUNTER — Ambulatory Visit
Admission: RE | Admit: 2021-03-05 | Discharge: 2021-03-05 | Disposition: A | Discharge status: Home / Self Care | Payer: Medicare PPO | Source: Ambulatory Visit | Attending: Radiation Oncology | Admitting: Radiation Oncology

## 2021-03-05 VITALS — BP 137/83 | HR 90 | Temp 98.1°F | Resp 18 | Ht 64.0 in | Wt 156.7 lb

## 2021-03-05 DIAGNOSIS — Z170 Estrogen receptor positive status [ER+]: Secondary | ICD-10-CM | POA: Insufficient documentation

## 2021-03-05 DIAGNOSIS — Z79811 Long term (current) use of aromatase inhibitors: Secondary | ICD-10-CM | POA: Insufficient documentation

## 2021-03-05 DIAGNOSIS — Z888 Allergy status to other drugs, medicaments and biological substances status: Secondary | ICD-10-CM | POA: Insufficient documentation

## 2021-03-05 DIAGNOSIS — Z885 Allergy status to narcotic agent status: Secondary | ICD-10-CM | POA: Insufficient documentation

## 2021-03-05 DIAGNOSIS — Z79899 Other long term (current) drug therapy: Secondary | ICD-10-CM | POA: Insufficient documentation

## 2021-03-05 DIAGNOSIS — M85851 Other specified disorders of bone density and structure, right thigh: Secondary | ICD-10-CM | POA: Insufficient documentation

## 2021-03-05 DIAGNOSIS — C773 Secondary and unspecified malignant neoplasm of axilla and upper limb lymph nodes: Secondary | ICD-10-CM | POA: Insufficient documentation

## 2021-03-05 DIAGNOSIS — C50311 Malignant neoplasm of lower-inner quadrant of right female breast: Secondary | ICD-10-CM | POA: Diagnosis not present

## 2021-03-05 MED ORDER — ANASTROZOLE 1 MG PO TABS: 1.0000 mg | ORAL_TABLET | Freq: Every day | ORAL | 3 refills | Status: DC

## 2021-03-05 NOTE — Progress Notes (Signed)
[place]   Telephone:([phone] Fax:(336) [phone]   Clinic Follow up Note   Patient Care Team: [name], PA-C as PCP - General (Family Medicine) Rockwell Germany, RN as Oncology Nurse Navigator Mauro Kaufmann, RN as Oncology Nurse Navigator Truitt Merle, MD as Consulting Physician (Hematology) [name], MD as Consulting Physician (General Surgery) [name], MD as Consulting Physician (Radiation Oncology)  Date of Service:  03/05/2021  CHIEF COMPLAINT: f/u of right breast cancer  CURRENT THERAPY:  Adjuvant radiation 1/26-03/09/21 To start anastrozole  ASSESSMENT & PLAN:  [name] is a 71 y.o. female with   1. 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 Biopsy 10/16/20 confirmed invasive and in situ lobular carcinoma. -right lumpectomy on 12/03/20 under Dr. Donne Hazel revealed multifocal invasive lobular carcinoma, 0.9 cm and 0.5 cm. -Oncotype RS of 23, indicating 9% risk of outside recurrence. -she is currently receiving adjuvant radiation under Dr. Sondra Come, started 01/29/21 and scheduled through 03/09/21. -Giving the strong ER and PR expression in her postmenopausal status, I recommend adjuvant endocrine therapy with aromatase inhibitor for a total of 5-10 years to reduce the risk of cancer recurrence. Given the lobular histology, I will likely do 10 years if she tolerates well. Potential benefits and side effects were discussed with patient and she is interested. I called in anastrozole for her to start in 3-4 weeks after radiation. -We also reviewed breast cancer surveillance after her surgery. She will continue annual screening mammogram, self exam, and a routine office visit with lab and exam with [place]. -I encouraged her to have healthy diet and exercise regularly.    2. Bone Health  -Her most recent DEXA was in 2017. She  underwent repeat on 11/05/20 at [facility] showing osteopenia (T-score of -1.2 at right hip) -we discussed that AI can decrease her bone density. We will monitor every 2 years. -I recommend she take calcium and vit D and that she continue weight-bearing exercise.     PLAN:  -continue daily radiation through 03/09/21 -start anastrozole in 3-4 weeks -survivorship in 3 months -lab and f/u in 6 months   No problem-specific Assessment & Plan notes found for this encounter.   SUMMARY OF ONCOLOGIC HISTORY: Oncology History Overview Note   Cancer Staging  Malignant neoplasm of lower-inner quadrant of right breast of female, estrogen receptor positive ([place]) Staging form: Breast, AJCC 8th Edition - Clinical stage from 10/16/2020: Stage IA (cT1b, cN0, cM0, G2, ER+, PR+, HER2-) - Signed by Truitt Merle, MD on 10/29/2020 - Pathologic stage from 12/03/2020: Stage IA (pT1c, pN0(i+), cM0, G2, ER+, PR+, HER2-, Oncotype DX score: 23) - Signed by Truitt Merle, MD on 03/05/2021     Malignant neoplasm of lower-inner quadrant of right breast of female, estrogen receptor positive ([place])  10/08/2020 Mammogram   Right Diagnostic MM and Right Breast US  -Three irregular masses in lower-inner right breast:   -8 mm at 5-6 o'clock, 4 mm by [place]   -4 mm at 5 o'clock in anterior depth, 2 mm by [place]   -5 mm at 5 o'clock middle depth, 3 mm by [place] -Stable oval mass in right breast at 4 o'clock is benign -[place] of right axilla demonstrates mildly prominent right axillary lymph nodes but no definite lymphadenopathy.    10/16/2020 Cancer Staging   Staging form: Breast, AJCC 8th Edition - Clinical stage from 10/16/2020: Stage IA (cT1b, cN0, cM0, G2,  ER+, PR+, HER2-) - Signed by Truitt Merle, MD on 10/29/2020 Stage prefix: Initial diagnosis Histologic grading system: 3 grade system    10/16/2020 Pathology Results   Diagnosis 1. Breast, right, needle core biopsy, 6 o'clock mass - INVASIVE MAMMARY CARCINOMA. SEE NOTE - MAMMARY CARCINOMA  IN SITU 2. Breast, right, needle core biopsy, 4 o'clock mass - INVASIVE MAMMARY CARCINOMA. SEE NOTE 3. Breast, right, needle core biopsy, 5 o'clock mass - INVASIVE MAMMARY CARCINOMA. SEE NOTE - MAMMARY CARCINOMA IN SITU Diagnosis Note 1. , 2 and 3. Carcinoma measures 0.5 cm in part 1, 0.4 cm in part 2 and 0.2 cm in part 3; and appears grade 2.  ADDENDUM: 1. ,2 and 3. Immunohistochemical stain for E-cadherin is negative in the tumor cells, consistent with a lobular phenotype.  1. PROGNOSTIC INDICATORS Results: The tumor cells are NEGATIVE for Her2 (1+). Estrogen Receptor: 100%, POSITIVE, STRONG STAINING INTENSITY Progesterone Receptor: 50%, POSITIVE, MODERATE STAINING INTENSITY Proliferation Marker Ki67: 5%   10/24/2020 Initial Diagnosis   Malignant neoplasm of lower-inner quadrant of right breast of female, estrogen receptor positive ([place])   12/03/2020 Cancer Staging   Staging form: Breast, AJCC 8th Edition - Pathologic stage from 12/03/2020: Stage IA (pT1c, pN0(i+), cM0, G2, ER+, PR+, HER2-, Oncotype DX score: 23) - Signed by Truitt Merle, MD on 03/05/2021 Stage prefix: Initial diagnosis Multigene prognostic tests performed: Oncotype DX Recurrence score range: Greater than or equal to 11 Histologic grading system: 3 grade system    12/03/2020 Definitive Surgery   FINAL MICROSCOPIC DIAGNOSIS:   A. 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 BREAST, RIGHT MEDIAL MARGIN, EXCISION:  -  Focal residual invasive and in situ lobular carcinoma  -  Margins uninvolved by carcinoma (0.6 cm)   C. BREAST, RIGHT INFERIOR MARGIN, EXCISION:  -  Focal residual lobular carcinoma in situ  -  Margins uninvolved by carcinoma   D. BREAST, RIGHT LATERAL  MARGIN, EXCISION:  -  No residual carcinoma identified   E. BREAST, RIGHT SUPERIOR MARGIN, EXCISION:  -  No residual carcinoma identified   F. BREAST, RIGHT POSTERIOR MARGIN, EXCISION:  -  No residual carcinoma identified   G. LYMPH NODE, RIGHT AXILLARY, SENTINEL, EXCISION:  -  No carcinoma identified in one lymph node (0/1)  -  See comment   H. LYMPH NODE, RIGHT AXILLARY, SENTINEL, EXCISION:  -  No carcinoma identified in one lymph node (0/1)  -  See comment   I. LYMPH NODE, RIGHT AXILLARY, SENTINEL, EXCISION:  -  No carcinoma identified in one lymph node (0/1)  -  See comment   J. LYMPH NODE, RIGHT AXILLARY, SENTINEL, EXCISION:  -  Isolated tumor cells present in one lymph node (0i/1)  -  See comment    12/03/2020 Oncotype testing   Oncotype DX was obtained on the final surgical sample and the recurrence score of 23 predicts a risk of recurrence outside the breast over the next 9 years of 9%, if the patient's only systemic therapy is an antiestrogen for 5 years.  It also predicts no benefit from chemotherapy.       INTERVAL HISTORY:  Ashleynicole Mcclees is here for a follow up of breast cancer. She was last seen by  me on 10/29/20. She presents to the clinic alone. She reports her skin is very red from radiation, but she denies pain.   All other systems were reviewed with the patient and are negative.  MEDICAL HISTORY:  Past Medical History:  Diagnosis Date   Anxiety    takes Xanax daily    Arthritis    Breast cancer ([place])    Depression    takes Pristiq and Wellbutrin daily   GERD (gastroesophageal reflux disease)    Joint pain    Joint swelling    Nocturia    Seasonal allergies    Takes Zyrtec nightly along with Nasal Spray   Vitamin D deficiency    new script for Vit D to start 08/22/15    SURGICAL HISTORY: Past Surgical History:  Procedure Laterality Date   blephorplasty Bilateral    BREAST LUMPECTOMY WITH RADIOACTIVE SEED AND SENTINEL LYMPH NODE BIOPSY Right  12/03/2020   Procedure: RIGHT BREAST LUMPECTOMY WITH RADIOACTIVE SEEDS X3 AND AXILLARY SENTINEL LYMPH NODE BIOPSY;  Surgeon: [name], MD;  Location: [place];  Service: General;  Laterality: Right;   BREAST RECONSTRUCTION Right 12/09/2020   Procedure: RIGHT ONCOPLASTIC BREAST RECONSTRUCTION;  Surgeon: Irene Limbo, MD;  Location: [place];  Service: Plastics;  Laterality: Right;   BREAST REDUCTION SURGERY Left 12/09/2020   Procedure: LEFT MAMMARY REDUCTION  (BREAST);  Surgeon: Irene Limbo, MD;  Location: [place];  Service: Plastics;  Laterality: Left;   COLONOSCOPY     left leg surgery     rod   TOTAL KNEE ARTHROPLASTY Right 09/01/2015   TOTAL KNEE ARTHROPLASTY Right 09/01/2015   Procedure: TOTAL KNEE ARTHROPLASTY;  Surgeon: Frederik Pear, MD;  Location: [place];  Service: Orthopedics;  Laterality: Right;    I have reviewed the social history and family history with the patient and they are unchanged from previous note.  ALLERGIES:  is allergic to codeine and lisinopril.  MEDICATIONS:  Current Outpatient Medications  Medication Sig Dispense Refill   anastrozole (ARIMIDEX) 1 MG tablet Take 1 tablet (1 mg total) by mouth daily. 30 tablet 3   ALPRAZolam (XANAX) 0.5 MG tablet Take 0.5 mg by mouth 2 (two) times daily as needed for anxiety.     azelastine (ASTELIN) 0.1 % nasal spray Place 2 sprays into both nostrils 2 (two) times daily. Use in each nostril as directed     buPROPion (WELLBUTRIN XL) 150 MG 24 hr tablet Take 450 mg by mouth daily.      calcium carbonate (OS-CAL - DOSED IN MG OF ELEMENTAL CALCIUM) 1250 (500 Ca) MG tablet Take by mouth.     cetirizine (ZYRTEC) 10 MG tablet Take 10 mg by mouth at bedtime.      Cholecalciferol 50 MCG (2000 UT) TABS 1 tablet     demeclocycline (DECLOMYCIN) 300 MG tablet Take 1 tablet (300 mg total) by mouth daily. 90 tablet 3   desvenlafaxine (PRISTIQ) 50 MG 24 hr tablet Take 50 mg by  mouth daily.      lansoprazole (PREVACID) 15 MG capsule Take 15 mg by mouth daily. At 6AM     Melatonin 10 MG TABS Take 10 mg by mouth at bedtime.     rosuvastatin (CRESTOR) 5 MG tablet Take 1 tablet (5 mg total) by mouth daily. 90 tablet 3   telmisartan (MICARDIS) 80 MG tablet Take 80 mg by mouth daily.     Vitamin D, Ergocalciferol, (DRISDOL) 50000 units CAPS capsule Take 2,000 Units  by mouth daily.      zolpidem (AMBIEN) 10 MG tablet Take 10 mg by mouth at bedtime.     No current facility-administered medications for this visit.    PHYSICAL EXAMINATION: ECOG PERFORMANCE STATUS: 0 - Asymptomatic  Vitals:   03/05/21 1340  BP: 137/83  Pulse: 90  Resp: 18  Temp: 98.1 F (36.7 C)  SpO2: 100%   Wt Readings from Last 3 Encounters:  03/05/21 156 lb 11.2 oz (71.1 kg)  01/19/21 151 lb 6.4 oz (68.7 kg)  12/09/20 149 lb 14.6 oz (68 kg)     GENERAL:alert, no distress and comfortable SKIN: skin color normal, no rashes or significant lesions EYES: normal, Conjunctiva are pink and non-injected, sclera clear  NEURO: alert & oriented x 3 with fluent speech  LABORATORY DATA:  I have reviewed the data as listed CBC Latest Ref Rng & Units 10/29/2020 03/08/2020 09/03/2015  WBC 4.0 - 10.5 K/uL 6.2 10.9(H) 9.3  Hemoglobin 12.0 - 15.0 g/dL 13.6 12.0 10.0(L)  Hematocrit 36.0 - 46.0 % 39.9 35.2(L) 30.5(L)  Platelets 150 - 400 K/uL 352 382 264     CMP Latest Ref Rng & Units 10/29/2020 06/25/2020 03/08/2020  Glucose 70 - 99 mg/dL 54(L) 110(H) 125(H)  BUN 8 - 23 mg/dL 18 21 21   Creatinine 0.44 - 1.00 mg/dL 0.99 1.01 1.19(H)  Sodium 135 - 145 mmol/L 139 135 136  Potassium 3.5 - 5.1 mmol/L 4.1 4.2 3.9  Chloride 98 - 111 mmol/L 103 99 104  CO2 22 - 32 mmol/L 25 28 24   Calcium 8.9 - 10.3 mg/dL 10.0 9.8 9.3  Total Protein 6.5 - 8.1 g/dL 7.7 - 6.6  Total Bilirubin 0.3 - 1.2 mg/dL 0.6 - 0.6  Alkaline Phos 38 - 126 U/L 96 - 90  AST 15 - 41 U/L 19 - 19  ALT 0 - 44 U/L 12 - 14      RADIOGRAPHIC  STUDIES: I have personally reviewed the radiological images as listed and agreed with the findings in the report. No results found.    No orders of the defined types were placed in this encounter.  All questions were answered. The patient knows to call the clinic with any problems, questions or concerns. No barriers to learning was detected. The total time spent in the appointment was 30 minutes.     Truitt Merle, MD 03/05/2021   I, Wilburn Mylar, am acting as scribe for Truitt Merle, MD.   I have reviewed the above documentation for accuracy and completeness, and I agree with the above.

## 2021-03-06 ENCOUNTER — Ambulatory Visit: Payer: Medicare PPO

## 2021-03-06 ENCOUNTER — Ambulatory Visit
Admission: RE | Admit: 2021-03-06 | Discharge: 2021-03-06 | Disposition: A | Discharge status: Home / Self Care | Payer: Medicare PPO | Source: Ambulatory Visit | Attending: Radiation Oncology | Admitting: Radiation Oncology

## 2021-03-06 ENCOUNTER — Other Ambulatory Visit: Payer: Self-pay

## 2021-03-06 ENCOUNTER — Ambulatory Visit: Payer: Medicare PPO | Admitting: Endocrinology

## 2021-03-06 VITALS — BP 120/80 | HR 100 | Ht 64.0 in | Wt 156.6 lb

## 2021-03-06 DIAGNOSIS — E871 Hypo-osmolality and hyponatremia: Secondary | ICD-10-CM | POA: Diagnosis not present

## 2021-03-06 DIAGNOSIS — Z170 Estrogen receptor positive status [ER+]: Secondary | ICD-10-CM | POA: Diagnosis not present

## 2021-03-06 DIAGNOSIS — C50311 Malignant neoplasm of lower-inner quadrant of right female breast: Secondary | ICD-10-CM | POA: Diagnosis not present

## 2021-03-06 NOTE — Patient Instructions (Addendum)
Blood tests are requested for you today.  We'll let you know about the results.  ?If you get sick, especially with vomiting or diarrhea, you should this checked then, also.   ?You do not need to limit salt.   ?Please come back for a follow-up appointment in 6 months.   ? ?

## 2021-03-06 NOTE — Progress Notes (Signed)
? ?Subjective:  ? ? Patient ID: [name], female    DOB: 11/27/1950, 71 y.o.   MRN: [id number] ? ?HPI ?Pt returns for f/u of hyponatremia (dx'ed 5427; uncertain etiology; USG was 1.018 in 2017; ACTH stim test was normal; head CT in 2022 made no mention of the pituitary, but she has never had dedicated pituitary imaging; she was rx'ed declomycin).  She takes declomycin as rx'ed.  She has been dx'ed with breast CA.  He says chronic nocturia is unchanged.  ?Past Medical History:  ?Diagnosis Date  ? Anxiety   ? takes Xanax daily   ? Arthritis   ? Breast cancer ([place])   ? Depression   ? takes Pristiq and Wellbutrin daily  ? GERD (gastroesophageal reflux disease)   ? Joint pain   ? Joint swelling   ? Nocturia   ? Seasonal allergies   ? Takes Zyrtec nightly along with Nasal Spray  ? Vitamin D deficiency   ? new script for Vit D to start 08/22/15  ? ? ?Past Surgical History:  ?Procedure Laterality Date  ? blephorplasty Bilateral   ? BREAST LUMPECTOMY WITH RADIOACTIVE SEED AND SENTINEL LYMPH NODE BIOPSY Right 12/03/2020  ? Procedure: RIGHT BREAST LUMPECTOMY WITH RADIOACTIVE SEEDS X3 AND AXILLARY SENTINEL LYMPH NODE BIOPSY;  Surgeon: [name], MD;  Location: [place];  Service: General;  Laterality: Right;  ? BREAST RECONSTRUCTION Right 12/09/2020  ? Procedure: RIGHT ONCOPLASTIC BREAST RECONSTRUCTION;  Surgeon: Irene Limbo, MD;  Location: [place];  Service: Plastics;  Laterality: Right;  ? BREAST REDUCTION SURGERY Left 12/09/2020  ? Procedure: LEFT MAMMARY REDUCTION  (BREAST);  Surgeon: Irene Limbo, MD;  Location: [place];  Service: Plastics;  Laterality: Left;  ? COLONOSCOPY    ? left leg surgery    ? rod  ? TOTAL KNEE ARTHROPLASTY Right 09/01/2015  ? TOTAL KNEE ARTHROPLASTY Right 09/01/2015  ? Procedure: TOTAL KNEE ARTHROPLASTY;  Surgeon: Frederik Pear, MD;  Location: [place];  Service: Orthopedics;  Laterality: Right;  ? ? ?Social History  ? ?Socioeconomic  History  ? Marital status: Widowed  ?  Spouse name: Not on file  ? Number of children: 0  ? Years of education: Not on file  ? Highest education level: Not on file  ?Occupational History  ? Not on file  ?Tobacco Use  ? Smoking status: Never  ? Smokeless tobacco: Never  ?Substance and Sexual Activity  ? Alcohol use: Yes  ?  Alcohol/week: 2.0 standard drinks  ?  Types: 2 Glasses of wine per week  ?  Comment: couple times a wk  ? Drug use: No  ? Sexual activity: Not Currently  ?  Birth control/protection: Post-menopausal  ?Other Topics Concern  ? Not on file  ?Social History Narrative  ? Not on file  ? ?Social Determinants of Health  ? ?Financial Resource Strain: Not on file  ?Food Insecurity: Not on file  ?Transportation Needs: Not on file  ?Physical Activity: Not on file  ?Stress: Not on file  ?Social Connections: Not on file  ?Intimate Partner Violence: Not on file  ? ? ?Current Outpatient Medications on File Prior to Visit  ?Medication Sig Dispense Refill  ? ALPRAZolam (XANAX) 0.5 MG tablet Take 0.5 mg by mouth 2 (two) times daily as needed for anxiety.    ? anastrozole (ARIMIDEX) 1 MG tablet Take 1 tablet (1 mg total) by mouth daily. 30 tablet 3  ? azelastine (ASTELIN) 0.1 % nasal spray  Place 2 sprays into both nostrils 2 (two) times daily. Use in each nostril as directed    ? buPROPion (WELLBUTRIN XL) 150 MG 24 hr tablet Take 450 mg by mouth daily.     ? calcium carbonate (OS-CAL - DOSED IN MG OF ELEMENTAL CALCIUM) 1250 (500 Ca) MG tablet Take by mouth.    ? cetirizine (ZYRTEC) 10 MG tablet Take 10 mg by mouth at bedtime.     ? Cholecalciferol 50 MCG (2000 UT) TABS 1 tablet    ? desvenlafaxine (PRISTIQ) 50 MG 24 hr tablet Take 50 mg by mouth daily.     ? lansoprazole (PREVACID) 15 MG capsule Take 15 mg by mouth daily. At 6AM    ? Melatonin 10 MG TABS Take 10 mg by mouth at bedtime.    ? telmisartan (MICARDIS) 80 MG tablet Take 80 mg by mouth daily.    ? Vitamin D, Ergocalciferol, (DRISDOL) 50000 units CAPS  capsule Take 2,000 Units by mouth daily.     ? zolpidem (AMBIEN) 10 MG tablet Take 10 mg by mouth at bedtime.    ? rosuvastatin (CRESTOR) 5 MG tablet Take 1 tablet (5 mg total) by mouth daily. 90 tablet 3  ? ?No current facility-administered medications on file prior to visit.  ? ? ?Allergies  ?Allergen Reactions  ? Codeine Nausea And Vomiting  ?  Sick to stomach  ? Lisinopril Cough  ?  Other reaction(s): Cough  ? ? ?Family History  ?Problem Relation Age of Onset  ? Cancer Maternal Grandmother 39  ?     colon cancer  ? Other Neg Hx   ?     hyponatremia  ? ? ?BP 120/80   Pulse 100   Ht 5\' 4"  (1.626 m)   Wt 156 lb 9.6 oz (71 kg)   LMP  (LMP Unknown)   SpO2 97%   BMI 26.88 kg/m?  ? ? ?Review of Systems ? ?   ?Objective:  ? Physical Exam ?VITAL SIGNS:  See vs page ?GENERAL: no distress ?EXT: trace bilat leg edema ? ?Lab Results  ?Component Value Date  ? CREATININE 0.91 03/06/2021  ? BUN 16 03/06/2021  ? NA 138 03/06/2021  ? K 4.4 03/06/2021  ? CL 103 03/06/2021  ? CO2 27 03/06/2021  ? ? ?   ?Assessment & Plan:  ?Hyponatremia: well-controlled: Please continue the same declomycin.  ? ?

## 2021-03-07 LAB — BASIC METABOLIC PANEL
BUN: 16 mg/dL (ref 7–25)
CO2: 27 mmol/L (ref 20–32)
Calcium: 9.9 mg/dL (ref 8.6–10.4)
Chloride: 103 mmol/L (ref 98–110)
Creat: 0.91 mg/dL (ref 0.50–1.05)
Glucose, Bld: 102 mg/dL — ABNORMAL HIGH (ref 65–99)
Potassium: 4.4 mmol/L (ref 3.5–5.3)
Sodium: 138 mmol/L (ref 135–146)

## 2021-03-07 MED ORDER — DEMECLOCYCLINE HCL 300 MG PO TABS: 300.0000 mg | ORAL_TABLET | Freq: Every day | ORAL | 3 refills | Status: DC

## 2021-03-09 ENCOUNTER — Ambulatory Visit: Payer: Medicare PPO

## 2021-03-09 ENCOUNTER — Encounter: Payer: Self-pay | Admitting: Radiation Oncology

## 2021-03-09 ENCOUNTER — Ambulatory Visit
Admission: RE | Admit: 2021-03-09 | Discharge: 2021-03-09 | Disposition: A | Discharge status: Home / Self Care | Payer: Medicare PPO | Source: Ambulatory Visit | Attending: Radiation Oncology | Admitting: Radiation Oncology

## 2021-03-09 ENCOUNTER — Other Ambulatory Visit: Payer: Self-pay

## 2021-03-09 DIAGNOSIS — C50311 Malignant neoplasm of lower-inner quadrant of right female breast: Secondary | ICD-10-CM | POA: Diagnosis not present

## 2021-03-09 DIAGNOSIS — Z170 Estrogen receptor positive status [ER+]: Secondary | ICD-10-CM | POA: Diagnosis not present

## 2021-03-09 MED ORDER — RADIAPLEXRX EX GEL: Freq: Once | CUTANEOUS | Status: AC

## 2021-03-09 NOTE — Addendum Note (Signed)
Addended by: Estella Husk on: 03/09/2021 10:19 AM ? ? Modules accepted: Orders ? ?

## 2021-03-10 ENCOUNTER — Ambulatory Visit: Payer: Medicare PPO

## 2021-03-16 ENCOUNTER — Ambulatory Visit: Payer: Self-pay

## 2021-03-23 DIAGNOSIS — Z124 Encounter for screening for malignant neoplasm of cervix: Secondary | ICD-10-CM | POA: Diagnosis not present

## 2021-03-23 DIAGNOSIS — Z01419 Encounter for gynecological examination (general) (routine) without abnormal findings: Secondary | ICD-10-CM | POA: Diagnosis not present

## 2021-03-23 DIAGNOSIS — C50911 Malignant neoplasm of unspecified site of right female breast: Secondary | ICD-10-CM | POA: Diagnosis not present

## 2021-03-23 DIAGNOSIS — Z170 Estrogen receptor positive status [ER+]: Secondary | ICD-10-CM | POA: Diagnosis not present

## 2021-03-27 ENCOUNTER — Other Ambulatory Visit: Payer: Self-pay | Admitting: Internal Medicine

## 2021-03-30 ENCOUNTER — Ambulatory Visit: Payer: Medicare PPO

## 2021-03-30 ENCOUNTER — Other Ambulatory Visit: Payer: Self-pay

## 2021-03-30 MED ORDER — ROSUVASTATIN CALCIUM 5 MG PO TABS: 5.0000 mg | ORAL_TABLET | Freq: Every day | ORAL | 3 refills | Status: DC

## 2021-04-10 ENCOUNTER — Encounter: Payer: Self-pay | Admitting: Radiology

## 2021-04-12 NOTE — Progress Notes (Incomplete)
?  Radiation Oncology         [phone]) [phone] ?________________________________ ? ?Patient Name: [name] ?MRN: [id number] ?DOB: [birth year] ?Referring Physician: Truitt Merle (Profile Not Attached) ?Date of Service: 03/09/2021 ?[facility], [place] ? ?                                                      End Of Treatment Note ? ?Diagnoses: C50.311-Malignant neoplasm of lower-inner quadrant of right female breast ?Z17.0-Estrogen receptor positive status [ER+] ? ?Cancer Staging:  S/p right lumpectomy: Stage IA (cT1b, cN0, cM0) Right Breast LIQ, Multifocal invasive lobular carcinoma, and lobular carcinoma in-situ, ER+ / PR+ / Her2-, Grade 2 ? ?Intent: Curative ? ?Radiation Treatment Dates: 01/29/2021 through 03/09/2021 ?Site Technique Total Dose (Gy) Dose per Fx (Gy) Completed Fx Beam Energies  ?Breast, Right: Breast_R 3D 50.4/50.4 1.8 28/28 6X  ? ?Narrative: The patient tolerated radiation therapy relatively well. On the date of her final treatment (today), the patient reported occasional shooting pains, redness, and itching to the right breast. She also reported mild fatigue. Physical exam performed on the date of her final treatment revealed diffuse erythema and reconstruction scars to the right breast. No skin breakdown was appreciated.  ? ?Plan: Overall, she tolerated treatment well.  She did develop a brisk reaction towards the end of treatment but no moist desquamation.  She continues to use a compression bra. The patient will follow-up with radiation oncology in one month. ? ?________________________________________________ ?----------------------------------- ? ?Blair Promise, PhD, MD ? ?This document serves as a record of services personally performed by [name], MD. It was created on his behalf by Roney Mans, a trained medical scribe. The creation of this record is based on the scribe's personal observations and the provider's statements to them. This document has been checked and approved  by the attending provider. ? ?

## 2021-04-13 ENCOUNTER — Encounter: Payer: Self-pay | Admitting: Radiation Oncology

## 2021-04-13 ENCOUNTER — Ambulatory Visit
Admission: RE | Admit: 2021-04-13 | Discharge: 2021-04-13 | Disposition: A | Discharge status: Home / Self Care | Payer: Medicare PPO | Source: Ambulatory Visit | Attending: Radiation Oncology | Admitting: Radiation Oncology

## 2021-04-13 ENCOUNTER — Ambulatory Visit: Payer: Medicare PPO | Attending: General Surgery

## 2021-04-13 ENCOUNTER — Other Ambulatory Visit: Payer: Self-pay

## 2021-04-13 VITALS — BP 125/92 | HR 113 | Temp 97.6°F | Resp 20 | Ht 63.5 in | Wt 154.2 lb

## 2021-04-13 VITALS — Wt 153.2 lb

## 2021-04-13 DIAGNOSIS — C50311 Malignant neoplasm of lower-inner quadrant of right female breast: Secondary | ICD-10-CM | POA: Diagnosis not present

## 2021-04-13 DIAGNOSIS — Z170 Estrogen receptor positive status [ER+]: Secondary | ICD-10-CM | POA: Diagnosis not present

## 2021-04-13 DIAGNOSIS — Z483 Aftercare following surgery for neoplasm: Secondary | ICD-10-CM | POA: Insufficient documentation

## 2021-04-13 DIAGNOSIS — Z79899 Other long term (current) drug therapy: Secondary | ICD-10-CM | POA: Diagnosis not present

## 2021-04-13 DIAGNOSIS — Z79811 Long term (current) use of aromatase inhibitors: Secondary | ICD-10-CM | POA: Insufficient documentation

## 2021-04-13 DIAGNOSIS — Z923 Personal history of irradiation: Secondary | ICD-10-CM | POA: Diagnosis not present

## 2021-04-13 DIAGNOSIS — R5383 Other fatigue: Secondary | ICD-10-CM | POA: Insufficient documentation

## 2021-04-13 NOTE — Progress Notes (Signed)
[name] is here today for follow up post radiation to the breast. ? ? Breast Side:RT ? ? ?They completed their radiation on: 03/09/2021  ? ?Does the patient complain of any of the following: ?Post radiation skin issues: No ?Breast Tenderness: Yes 3/10 RT ?Breast Swelling: No ?Lymphadema: No ?Range of Motion limitations: None ?Fatigue post radiation: Yes- Moderate ?Appetite good/fair/poor: Fair ? ?Additional comments if applicable: None ?

## 2021-04-13 NOTE — Therapy (Signed)
?OUTPATIENT PHYSICAL THERAPY SOZO SCREENING NOTE ? ? ?Patient Name: [name] ?MRN: [id number] ?DOB:08/12/1950, 71 y.o., female ?Today's Date: 04/13/2021 ? ?PCP: [name], PA-C ?REFERRING PROVIDER: [name], MD ? ? PT End of Session - 04/13/21 1622   ? ? Visit Number 2   #unchanged due to screen only  ? PT Start Time 1619   ? PT Stop Time 2440   ? PT Time Calculation (min) 5 min   ? Activity Tolerance Patient tolerated treatment well   ? Behavior During Therapy [facility] for tasks assessed/performed   ? ?  ?  ? ?  ? ? ?Past Medical History:  ?Diagnosis Date  ? Anxiety   ? takes Xanax daily   ? Arthritis   ? Breast cancer ([place])   ? Depression   ? takes Pristiq and Wellbutrin daily  ? GERD (gastroesophageal reflux disease)   ? History of radiation therapy   ? right breast  01/29/2021-03/09/2021  Dr [name]  ? Joint pain   ? Joint swelling   ? Nocturia   ? Seasonal allergies   ? Takes Zyrtec nightly along with Nasal Spray  ? Vitamin D deficiency   ? new script for Vit D to start 08/22/15  ? ?Past Surgical History:  ?Procedure Laterality Date  ? blephorplasty Bilateral   ? BREAST LUMPECTOMY WITH RADIOACTIVE SEED AND SENTINEL LYMPH NODE BIOPSY Right 12/03/2020  ? Procedure: RIGHT BREAST LUMPECTOMY WITH RADIOACTIVE SEEDS X3 AND AXILLARY SENTINEL LYMPH NODE BIOPSY;  Surgeon: [name], MD;  Location: [place];  Service: General;  Laterality: Right;  ? BREAST RECONSTRUCTION Right 12/09/2020  ? Procedure: RIGHT ONCOPLASTIC BREAST RECONSTRUCTION;  Surgeon: Irene Limbo, MD;  Location: [place];  Service: Plastics;  Laterality: Right;  ? BREAST REDUCTION SURGERY Left 12/09/2020  ? Procedure: LEFT MAMMARY REDUCTION  (BREAST);  Surgeon: Irene Limbo, MD;  Location: [place];  Service: Plastics;  Laterality: Left;  ? COLONOSCOPY    ? left leg surgery    ? rod  ? TOTAL KNEE ARTHROPLASTY Right 09/01/2015  ? TOTAL KNEE ARTHROPLASTY Right 09/01/2015  ?  Procedure: TOTAL KNEE ARTHROPLASTY;  Surgeon: Frederik Pear, MD;  Location: [place];  Service: Orthopedics;  Laterality: Right;  ? ?Patient Active Problem List  ? Diagnosis Date Noted  ? Malignant neoplasm of lower-inner quadrant of right breast of female, estrogen receptor positive ([place]) 10/24/2020  ? Anxiety 10/28/2016  ? Allergic rhinitis 10/28/2016  ? HTN (hypertension) 10/28/2016  ? Constipation 10/28/2016  ? Insomnia 10/28/2016  ? Vitamin D deficiency 10/28/2016  ? Hyponatremia 10/26/2016  ? Primary osteoarthritis of right knee 08/28/2015  ? ? ?REFERRING DIAG: right breast cancer at risk for lymphedema ? ?THERAPY DIAG:  ?Aftercare following surgery for neoplasm ? ?PERTINENT HISTORY: Patient was diagnosed on 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 She underwent a right lumpectomy and sentinel node biopsy (4 nodes all negative except one with isolated tumor cells) on 12/03/2020. She then underwent a bilateral breast reduction on 12/09/2020.. It is ER/PR positive and HER2 negative with a Ki67 of 5%.  ? ?PRECAUTIONS: right UE Lymphedema risk, None ? ?SUBJECTIVE: Pt returns for her 3 month L-Dex screen.  ? ?PAIN:  ?Are you having pain? No ? ?SOZO SCREENING: ?Patient was assessed today using the SOZO machine to determine the lymphedema index score. This was compared to her baseline score. It was determined that she is within the recommended range when compared to her baseline and no  further action is needed at this time. She will continue SOZO screenings. These are done every 3 months for 2 years post operatively followed by every 6 months for 2 years, and then annually. ? ? ? ?Otelia Limes, PTA ?04/13/2021, 4:23 PM ? ?  ? ?

## 2021-04-13 NOTE — Progress Notes (Signed)
?Radiation Oncology         [phone]) [phone] ?________________________________ ? ?Name: [name] MRN: [id number]  ?Date: 04/13/2021  DOB: 07/06/1950 ? ?Follow-Up Visit Note ? ?CC: [name], Joline Maxcy, MD ? ?  ICD-10-CM   ?1. Malignant neoplasm of lower-inner quadrant of right breast of female, estrogen receptor positive ([place])  C50.311   ? Z17.0   ?  ? ? 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 ?Interval Since Last Radiation: 1 month and 2 days  ? ?Intent: Curative ? ?Radiation Treatment Dates: 01/29/2021 through 03/09/2021 ?Site Technique Total Dose (Gy) Dose per Fx (Gy) Completed Fx Beam Energies  ?Breast, Right: Breast_R 3D 50.4/50.4 1.8 28/28 6X  ? ? ?Narrative:  The patient returns today for routine follow-up.  The patient tolerated radiation therapy relatively well. On the date of her final treatment, the patient reported occasional shooting pains, redness, and itching to the right breast. She also reported mild fatigue. Physical exam performed on the date of her final treatment revealed diffuse erythema and reconstruction scars to the right breast. No skin breakdown was appreciated. She also reported continued use of a compression bra during RT.             ? ?Near the end of radiation treatment, the patient followed up with Dr. Burr Medico on 03/05/21. During this visit, the patient reported redness from radiation but denied any pain. Given the strong ER and PR expression and her postmenopausal status, Dr. Burr Medico recommended adjuvant endocrine therapy with aromatase inhibitors for a total of 5-10 years to reduce the risk of cancer recurrence. Given her lobular histology, Dr. Burr Medico noted that she will likely do 10 years if she is able to tolerate treatment well. ? ?She reports her skin is healed well at this time.  She denies any itching within the breast area.  She has  occasional mild discomfort when lying on her right side to sleep.  She does not need any pain medication for this issue.  She denies any nipple discharge or bleeding.  She has started Arimidex and has noticed possibly some hot flashes. ? ?Allergies:  is allergic to codeine and lisinopril. ? ?Meds: ?Current Outpatient Medications  ?Medication Sig Dispense Refill  ? ALPRAZolam (XANAX) 0.5 MG tablet Take 0.5 mg by mouth 2 (two) times daily as needed for anxiety.    ? anastrozole (ARIMIDEX) 1 MG tablet Take 1 tablet (1 mg total) by mouth daily. 30 tablet 3  ? azelastine (ASTELIN) 0.1 % nasal spray Place 2 sprays into both nostrils 2 (two) times daily. Use in each nostril as directed    ? buPROPion (WELLBUTRIN XL) 300 MG 24 hr tablet Take 1 tablet by mouth daily.    ? calcium carbonate (OS-CAL - DOSED IN MG OF ELEMENTAL CALCIUM) 1250 (500 Ca) MG tablet Take by mouth.    ? cetirizine (ZYRTEC) 10 MG tablet Take 10 mg by mouth at bedtime.     ? Cholecalciferol 50 MCG (2000 UT) TABS 1 tablet    ? demeclocycline (DECLOMYCIN) 300 MG tablet Take 1 tablet (300 mg total) by mouth daily. 90 tablet 3  ? desvenlafaxine (PRISTIQ) 50 MG 24 hr tablet Take 50 mg by mouth daily.     ? lansoprazole (PREVACID) 15 MG capsule Take 15 mg by mouth daily. At 6AM    ? Melatonin 10 MG TABS  Take 10 mg by mouth at bedtime.    ? rosuvastatin (CRESTOR) 5 MG tablet Take 1 tablet (5 mg total) by mouth daily. 90 tablet 3  ? telmisartan (MICARDIS) 80 MG tablet Take 80 mg by mouth daily.    ? Vitamin D, Ergocalciferol, (DRISDOL) 50000 units CAPS capsule Take 2,000 Units by mouth daily.     ? zolpidem (AMBIEN) 10 MG tablet Take 10 mg by mouth at bedtime.    ? ?No current facility-administered medications for this encounter.  ? ? ?Physical Findings: ?The patient is in no acute distress. Patient is alert and oriented. ? height is 5' 3.5" (1.613 m) and weight is 154 lb 3.2 oz (69.9 kg). Her temporal temperature is 97.6 ?F (36.4 ?C). Her blood pressure is  125/92 (abnormal) and her pulse is 113 (abnormal). Her respiration is 20 and oxygen saturation is 100%. .   Lungs are clear to auscultation bilaterally. Heart has regular rate and rhythm. No palpable cervical, supraclavicular, or axillary adenopathy. Abdomen soft, non-tender, normal bowel sounds. ? ?Right Breast: no palpable mass, nipple discharge or bleeding.  She continues to have some mild edema in the nipple areolar complex.  Skin is healed well.  Mild hyperpigmentation changes noted.  No dominant mass appreciated in the breast.  Surgical scars from breast reduction noted. ? ? ? ?Lab Findings: ?Lab Results  ?Component Value Date  ? WBC 6.2 10/29/2020  ? HGB 13.6 10/29/2020  ? HCT 39.9 10/29/2020  ? MCV 93.4 10/29/2020  ? PLT 352 10/29/2020  ? ? ?Radiographic Findings: ?No results found. ? 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 ?The patient is recovering from the effects of radiation.  Skin is healed well at this time.  She denies any significant discomfort.  She continues to have some fatigue. ? ?Plan: As needed follow-up in radiation oncology.  The patient will continue close follow-up in medical oncology and continue on adjuvant hormonal therapy.  She will also periodically follow-up with her surgeon and plastic surgeon. ? ? ? ?____________________________________ ? ?Blair Promise, PhD, MD ? ?This document serves as a record of services personally performed by [name], MD. It was created on his behalf by Roney Mans, a trained medical scribe. The creation of this record is based on the scribe's personal observations and the provider's statements to them. This document has been checked and approved by the attending provider. ? ?

## 2021-05-14 DIAGNOSIS — D225 Melanocytic nevi of trunk: Secondary | ICD-10-CM | POA: Diagnosis not present

## 2021-05-14 DIAGNOSIS — Z1283 Encounter for screening for malignant neoplasm of skin: Secondary | ICD-10-CM | POA: Diagnosis not present

## 2021-06-02 ENCOUNTER — Telehealth: Payer: Self-pay | Admitting: *Deleted

## 2021-06-05 ENCOUNTER — Inpatient Hospital Stay: Payer: Medicare PPO | Attending: Radiation Oncology | Admitting: Adult Health

## 2021-06-05 ENCOUNTER — Other Ambulatory Visit: Payer: Self-pay

## 2021-06-05 ENCOUNTER — Encounter: Payer: Self-pay | Admitting: Adult Health

## 2021-06-05 VITALS — BP 115/67 | HR 102 | Temp 97.8°F | Resp 18 | Ht 63.5 in | Wt 161.9 lb

## 2021-06-05 DIAGNOSIS — Z79811 Long term (current) use of aromatase inhibitors: Secondary | ICD-10-CM | POA: Diagnosis not present

## 2021-06-05 DIAGNOSIS — C50311 Malignant neoplasm of lower-inner quadrant of right female breast: Secondary | ICD-10-CM | POA: Diagnosis not present

## 2021-06-05 DIAGNOSIS — Z885 Allergy status to narcotic agent status: Secondary | ICD-10-CM | POA: Diagnosis not present

## 2021-06-05 DIAGNOSIS — Z79899 Other long term (current) drug therapy: Secondary | ICD-10-CM | POA: Insufficient documentation

## 2021-06-05 DIAGNOSIS — Z170 Estrogen receptor positive status [ER+]: Secondary | ICD-10-CM | POA: Diagnosis not present

## 2021-06-05 DIAGNOSIS — R232 Flushing: Secondary | ICD-10-CM | POA: Insufficient documentation

## 2021-06-05 DIAGNOSIS — Z800 Family history of malignant neoplasm of digestive organs: Secondary | ICD-10-CM | POA: Diagnosis not present

## 2021-06-05 DIAGNOSIS — Z888 Allergy status to other drugs, medicaments and biological substances status: Secondary | ICD-10-CM | POA: Diagnosis not present

## 2021-06-05 DIAGNOSIS — Z923 Personal history of irradiation: Secondary | ICD-10-CM | POA: Insufficient documentation

## 2021-06-05 DIAGNOSIS — E871 Hypo-osmolality and hyponatremia: Secondary | ICD-10-CM | POA: Diagnosis not present

## 2021-06-05 DIAGNOSIS — M85852 Other specified disorders of bone density and structure, left thigh: Secondary | ICD-10-CM | POA: Insufficient documentation

## 2021-06-05 NOTE — Progress Notes (Signed)
Referral faxed to [facility] Endocrinology per request.  [phone]

## 2021-06-05 NOTE — Progress Notes (Signed)
SURVIVORSHIP VISIT:   BRIEF ONCOLOGIC HISTORY:  Oncology History Overview Note   Cancer Staging  Malignant neoplasm of lower-inner quadrant of right breast of female, estrogen receptor positive ([place]) Staging form: Breast, AJCC 8th Edition - Clinical stage from 10/16/2020: Stage IA (cT1b, cN0, cM0, G2, ER+, PR+, HER2-) - Signed by Truitt Merle, MD on 10/29/2020 - Pathologic stage from 12/03/2020: Stage IA (pT1c, pN0(i+), cM0, G2, ER+, PR+, HER2-, Oncotype DX score: 23) - Signed by Truitt Merle, MD on 03/05/2021     Malignant neoplasm of lower-inner quadrant of right breast of female, estrogen receptor positive ([place])  10/08/2020 Mammogram   Right Diagnostic MM and Right Breast US  -Three irregular masses in lower-inner right breast:   -8 mm at 5-6 o'clock, 4 mm by [place]   -4 mm at 5 o'clock in anterior depth, 2 mm by [place]   -5 mm at 5 o'clock middle depth, 3 mm by [place] -Stable oval mass in right breast at 4 o'clock is benign -[place] of right axilla demonstrates mildly prominent right axillary lymph nodes but no definite lymphadenopathy.    10/16/2020 Cancer Staging   Staging form: Breast, AJCC 8th Edition - Clinical stage from 10/16/2020: Stage IA (cT1b, cN0, cM0, G2, ER+, PR+, HER2-) - Signed by Truitt Merle, MD on 10/29/2020 Stage prefix: Initial diagnosis Histologic grading system: 3 grade system    10/16/2020 Pathology Results   Diagnosis 1. Breast, right, needle core biopsy, 6 o'clock mass - INVASIVE MAMMARY CARCINOMA. SEE NOTE - MAMMARY CARCINOMA IN SITU 2. Breast, right, needle core biopsy, 4 o'clock mass - INVASIVE MAMMARY CARCINOMA. SEE NOTE 3. Breast, right, needle core biopsy, 5 o'clock mass - INVASIVE MAMMARY CARCINOMA. SEE NOTE - MAMMARY CARCINOMA IN SITU Diagnosis Note 1. , 2 and 3. Carcinoma measures 0.5 cm in part 1, 0.4 cm in part 2 and 0.2 cm in part 3; and appears grade 2.  ADDENDUM: 1. ,2 and 3. Immunohistochemical stain for E-cadherin is negative in the tumor cells,  consistent with a lobular phenotype.  1. PROGNOSTIC INDICATORS Results: The tumor cells are NEGATIVE for Her2 (1+). Estrogen Receptor: 100%, POSITIVE, STRONG STAINING INTENSITY Progesterone Receptor: 50%, POSITIVE, MODERATE STAINING INTENSITY Proliferation Marker Ki67: 5%   10/24/2020 Initial Diagnosis   Malignant neoplasm of lower-inner quadrant of right breast of female, estrogen receptor positive ([place])    12/03/2020 Cancer Staging   Staging form: Breast, AJCC 8th Edition - Pathologic stage from 12/03/2020: Stage IA (pT1c, pN0(i+), cM0, G2, ER+, PR+, HER2-, Oncotype DX score: 23) - Signed by Truitt Merle, MD on 03/05/2021 Stage prefix: Initial diagnosis Multigene prognostic tests performed: Oncotype DX Recurrence score range: Greater than or equal to 11 Histologic grading system: 3 grade system    12/03/2020 Definitive Surgery   FINAL MICROSCOPIC DIAGNOSIS:   A. 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 BREAST, RIGHT MEDIAL MARGIN, EXCISION:  -  Focal residual invasive and in situ lobular carcinoma  -  Margins uninvolved by carcinoma (0.6 cm)   C. BREAST, RIGHT INFERIOR MARGIN, EXCISION:  -  Focal residual lobular carcinoma in situ  -  Margins uninvolved by carcinoma   D. BREAST, RIGHT LATERAL MARGIN, EXCISION:  -  No residual carcinoma identified  E. BREAST, RIGHT SUPERIOR MARGIN, EXCISION:  -  No residual carcinoma identified   F. BREAST, RIGHT POSTERIOR MARGIN, EXCISION:  -  No residual carcinoma identified   G. LYMPH NODE, RIGHT AXILLARY, SENTINEL, EXCISION:  -  No carcinoma identified in one lymph node (0/1)  -  See comment   H. LYMPH NODE, RIGHT AXILLARY, SENTINEL, EXCISION:  -  No carcinoma identified in one lymph node  (0/1)  -  See comment   I. LYMPH NODE, RIGHT AXILLARY, SENTINEL, EXCISION:  -  No carcinoma identified in one lymph node (0/1)  -  See comment   J. LYMPH NODE, RIGHT AXILLARY, SENTINEL, EXCISION:  -  Isolated tumor cells present in one lymph node (0i/1)  -  See comment    12/03/2020 Oncotype testing   Oncotype DX was obtained on the final surgical sample and the recurrence score of 23 predicts a risk of recurrence outside the breast over the next 9 years of 9%, if the patient's only systemic therapy is an antiestrogen for 5 years.  It also predicts no benefit from chemotherapy.    01/29/2021 - 03/09/2021 Anti-estrogen oral therapy   01/29/2021 through 03/09/2021 Site Technique Total Dose (Gy) Dose per Fx (Gy) Completed Fx Beam Energies  Breast, Right: Breast_R 3D 50.4/50.4 1.8 28/28 6X    04/2021 -  Anti-estrogen oral therapy   Anastrozole daily     INTERVAL HISTORY:  [name] to review [name] survivorship care plan detailing [name] treatment course for breast cancer, as well as monitoring long-term side effects of that treatment, education regarding health maintenance, screening, and overall wellness and health promotion.     Overall, [name] reports feeling quite well.  She is taking anastrozole daily with good tolerance.    REVIEW OF SYSTEMS:  Review of Systems  Constitutional:  Negative for appetite change, chills, fatigue, fever and unexpected weight change.  HENT:   Negative for hearing loss, lump/mass and trouble swallowing.   Eyes:  Negative for eye problems and icterus.  Respiratory:  Negative for chest tightness, cough and shortness of breath.   Cardiovascular:  Negative for chest pain, leg swelling and palpitations.  Gastrointestinal:  Negative for abdominal distention, abdominal pain, constipation, diarrhea, nausea and vomiting.  Endocrine: Positive for hot flashes.  Genitourinary:  Negative for difficulty urinating.   Musculoskeletal:  Negative for arthralgias.  Skin:   Negative for itching and rash.  Neurological:  Negative for dizziness, extremity weakness, headaches and numbness.  Hematological:  Negative for adenopathy. Does not bruise/bleed easily.  Psychiatric/Behavioral:  Negative for depression. The patient is not nervous/anxious.    Breast: Denies any new nodularity, masses, tenderness, nipple changes, or nipple discharge.      ONCOLOGY TREATMENT TEAM:  1. Surgeon:  Dr. Donne Hazel at [facility] 2. Medical Oncologist: Dr. Burr Medico  3. Radiation Oncologist: Dr. Sondra Come    PAST MEDICAL/SURGICAL HISTORY:  Past Medical History:  Diagnosis Date   Anxiety    takes Xanax daily    Arthritis    Breast cancer ([place])    Depression    takes Pristiq and Wellbutrin daily   GERD (gastroesophageal reflux disease)    History of radiation therapy    right breast  01/29/2021-03/09/2021  Dr [name]   Joint pain    Joint swelling    Nocturia    Seasonal allergies    Takes Zyrtec nightly along with Nasal Spray   Vitamin D deficiency    new script for Vit D to start  08/22/15   Past Surgical History:  Procedure Laterality Date   blephorplasty Bilateral    BREAST LUMPECTOMY WITH RADIOACTIVE SEED AND SENTINEL LYMPH NODE BIOPSY Right 12/03/2020   Procedure: RIGHT BREAST LUMPECTOMY WITH RADIOACTIVE SEEDS X3 AND AXILLARY SENTINEL LYMPH NODE BIOPSY;  Surgeon: [name], MD;  Location: [facility];  Service: General;  Laterality: Right;   BREAST RECONSTRUCTION Right 12/09/2020   Procedure: RIGHT ONCOPLASTIC BREAST RECONSTRUCTION;  Surgeon: Irene Limbo, MD;  Location: [place];  Service: Plastics;  Laterality: Right;   BREAST REDUCTION SURGERY Left 12/09/2020   Procedure: LEFT MAMMARY REDUCTION  (BREAST);  Surgeon: Irene Limbo, MD;  Location: [facility];  Service: Plastics;  Laterality: Left;   COLONOSCOPY     left leg surgery     rod   TOTAL KNEE ARTHROPLASTY Right 09/01/2015   TOTAL  KNEE ARTHROPLASTY Right 09/01/2015   Procedure: TOTAL KNEE ARTHROPLASTY;  Surgeon: Frederik Pear, MD;  Location: [facility];  Service: Orthopedics;  Laterality: Right;     ALLERGIES:  Allergies  Allergen Reactions   Codeine Nausea And Vomiting    Sick to stomach   Lisinopril Cough    Other reaction(s): Cough     CURRENT MEDICATIONS:  Outpatient Encounter Medications as of 06/05/2021  Medication Sig   ALPRAZolam (XANAX) 0.5 MG tablet Take 0.5 mg by mouth 2 (two) times daily as needed for anxiety.   anastrozole (ARIMIDEX) 1 MG tablet Take 1 tablet (1 mg total) by mouth daily.   azelastine (ASTELIN) 0.1 % nasal spray Place 2 sprays into both nostrils 2 (two) times daily. Use in each nostril as directed   buPROPion (WELLBUTRIN XL) 300 MG 24 hr tablet Take 1 tablet by mouth daily.   calcium carbonate (OS-CAL - DOSED IN MG OF ELEMENTAL CALCIUM) 1250 (500 Ca) MG tablet Take by mouth.   cetirizine (ZYRTEC) 10 MG tablet Take 10 mg by mouth at bedtime.    Cholecalciferol 50 MCG (2000 UT) TABS 1 tablet   demeclocycline (DECLOMYCIN) 300 MG tablet Take 1 tablet (300 mg total) by mouth daily.   desvenlafaxine (PRISTIQ) 50 MG 24 hr tablet Take 50 mg by mouth daily.    lansoprazole (PREVACID) 15 MG capsule Take 15 mg by mouth daily. At 6AM   Melatonin 10 MG TABS Take 10 mg by mouth at bedtime.   rosuvastatin (CRESTOR) 5 MG tablet Take 1 tablet (5 mg total) by mouth daily.   telmisartan (MICARDIS) 80 MG tablet Take 80 mg by mouth daily.   Vitamin D, Ergocalciferol, (DRISDOL) 50000 units CAPS capsule Take 2,000 Units by mouth daily.    zolpidem (AMBIEN) 10 MG tablet Take 10 mg by mouth at bedtime.   No facility-administered encounter medications on file as of 06/05/2021.     ONCOLOGIC FAMILY HISTORY:  Family History  Problem Relation Age of Onset   Cancer Maternal Grandmother 37       colon cancer   Other Neg Hx        hyponatremia     SOCIAL HISTORY:  Social History   Socioeconomic History    Marital status: Widowed    Spouse name: Not on file   Number of children: 0   Years of education: Not on file   Highest education level: Not on file  Occupational History   Not on file  Tobacco Use   Smoking status: Never   Smokeless tobacco: Never  Substance and Sexual Activity   Alcohol use: Yes  Alcohol/week: 2.0 standard drinks    Types: 2 Glasses of wine per week    Comment: couple times a wk   Drug use: No   Sexual activity: Not Currently    Birth control/protection: Post-menopausal  Other Topics Concern   Not on file  Social History Narrative   Not on file   Social Determinants of Health   Financial Resource Strain: Not on file  Food Insecurity: Not on file  Transportation Needs: Not on file  Physical Activity: Not on file  Stress: Not on file  Social Connections: Not on file  Intimate Partner Violence: Not on file     OBSERVATIONS/OBJECTIVE:  BP 115/67 (BP Location: Left Arm, Patient Position: Sitting)   Pulse (!) 102   Temp 97.8 F (36.6 C) (Tympanic)   Resp 18   Ht 5' 3.5" (1.613 m)   Wt 161 lb 14.4 oz (73.4 kg)   LMP  (LMP Unknown)   SpO2 100%   BMI 28.23 kg/m  GENERAL: Patient is a well appearing female in no acute distress HEENT:  Sclerae anicteric.  Oropharynx clear and moist. No ulcerations or evidence of oropharyngeal candidiasis. Neck is supple.  NODES:  No cervical, supraclavicular, or axillary lymphadenopathy palpated.  BREAST EXAM:  Deferred. LUNGS:  Clear to auscultation bilaterally.  No wheezes or rhonchi. HEART:  Regular rate and rhythm. No murmur appreciated. ABDOMEN:  Soft, nontender.  Positive, normoactive bowel sounds. No organomegaly palpated. MSK:  No focal spinal tenderness to palpation. Full range of motion bilaterally in the upper extremities. EXTREMITIES:  No peripheral edema.   SKIN:  Clear with no obvious rashes or skin changes. No nail dyscrasia. NEURO:  Nonfocal. Well oriented.  Appropriate affect.   LABORATORY DATA:   None for this visit.  DIAGNOSTIC IMAGING:  None for this visit.      ASSESSMENT AND PLAN:  [name] is a pleasant 71 y.o. female with Stage IA right breast invasive ductal carcinoma, ER+/PR+/HER2-, diagnosed in 10/2020, treated with lumpectomy, adjuvant radiation therapy, and anti-estrogen therapy with anastrozole beginning in 04/2021.  She presents to the Survivorship Clinic for our initial meeting and routine follow-up post-completion of treatment for breast cancer.    1. Stage IA right breast cancer:  [name] is continuing to recover from definitive treatment for breast cancer. She will follow-up with [name] medical oncologist, Dr. Burr Medico in 09/2021 with history and physical exam per surveillance protocol.  She will continue [name] anti-estrogen therapy with Anastrozole. Thus far, she is tolerating the Anastrozole well, with minimal side effects. She was instructed to make Dr. Lindi Adie or myself aware if she begins to experience any worsening side effects of the medication and I could see [name] back in clinic to help manage those side effects, as needed. [name] mammogram is due 09/2021; orders placed today. Today, a comprehensive survivorship care plan and treatment summary was reviewed with the patient today detailing [name] breast cancer diagnosis, treatment course, potential late/long-term effects of treatment, appropriate follow-up care with recommendations for the future, and patient education resources.  A copy of this summary, along with a letter will be sent to the patient's primary care provider via mail/fax/In Basket message after today's visit.    2. History of electrolyte imbalance/hyponatremia: Since Dr. Loanne Drilling retired, I referred [name] to see Dr. Buddy Duty at [facility]  3. Bone health:  Given Ms. Feld's age/history of breast cancer and [name] current treatment regimen including anti-estrogen therapy with Anastrozole, she is at risk for bone demineralization.  [name]  last DEXA scan was 11/2020, and  showed mild osteopenia with a T score of -1.2 in the left femur.  I reccommended that she repeat bone density testing in 2 years.   She was given education on specific activities to promote bone health.  4. Cancer screening:  Due to Ms. Schaible's history and [name] age, she should receive screening for skin cancers, colon cancer, and gynecologic cancers.  The information and recommendations are listed on the patient's comprehensive care plan/treatment summary and were reviewed in detail with the patient.    5. Health maintenance and wellness promotion: Ms. Mauriello was encouraged to consume 5-7 servings of fruits and vegetables per day. We reviewed the "Nutrition Rainbow" handout.  She was also encouraged to engage in moderate to vigorous exercise for 30 minutes per day most days of the week. We discussed the [employer] fitness program, which is designed for cancer survivors to help them become more physically fit after cancer treatments.  She was instructed to limit [name] alcohol consumption and continue to abstain from tobacco use.     6. Support services/counseling: It is not uncommon for this period of the patient's cancer care trajectory to be one of many emotions and stressors.   She was given information regarding our available services and encouraged to contact me with any questions or for help enrolling in any of our support group/programs.    Follow up instructions:    -Return to cancer center in 09/2021  -Mammogram due in 09/2021 -Bone density in 11/2022 -Follow up with surgery 03/2022 -She is welcome to return back to the Survivorship Clinic at any time; no additional follow-up needed at this time.  -Consider referral back to survivorship as a long-term survivor for continued surveillance  The patient was provided an opportunity to ask questions and all were answered. The patient agreed with the plan and demonstrated an understanding of the instructions.   The patient was advised to call back  or seek an in-person evaluation if the symptoms worsen or if the condition fails to improve as anticipated.   Total encounter time:45 minutes*in face-to-face visit time, chart review, lab review, care coordination, order entry, and documentation of the encounter time.    Wilber Bihari, NP 06/05/21 12:12 PM Medical Oncology and Hematology [facility] [place], [place] [address] Tel. [phone]    Fax. [phone]  *Total Encounter Time as defined by the Centers for Medicare and Medicaid Services includes, in addition to the face-to-face time of a patient visit (documented in the note above) non-face-to-face time: obtaining and reviewing outside history, ordering and reviewing medications, tests or procedures, care coordination (communications with other health care professionals or caregivers) and documentation in the medical record.

## 2021-06-25 DIAGNOSIS — Z8249 Family history of ischemic heart disease and other diseases of the circulatory system: Secondary | ICD-10-CM | POA: Diagnosis not present

## 2021-06-25 DIAGNOSIS — E782 Mixed hyperlipidemia: Secondary | ICD-10-CM | POA: Diagnosis not present

## 2021-06-25 DIAGNOSIS — R55 Syncope and collapse: Secondary | ICD-10-CM | POA: Diagnosis not present

## 2021-06-25 DIAGNOSIS — I77810 Thoracic aortic ectasia: Secondary | ICD-10-CM | POA: Diagnosis not present

## 2021-06-27 LAB — NMR, LIPOPROFILE
Cholesterol, Total: 187 mg/dL (ref 100–199)
HDL Particle Number: 37.6 umol/L (ref >=30.5)
HDL-C: 98 mg/dL (ref >39)
LDL Particle Number: 746 nmol/L (ref <1000)
LDL Size: 21.2 nm (ref >20.5)
LDL-C (NIH Calc): 78 mg/dL (ref 0–99)
LP-IR Score: <25 (ref <=45)
Small LDL Particle Number: <90 nmol/L (ref <=527)
Triglycerides: 57 mg/dL (ref 0–149)

## 2021-07-03 ENCOUNTER — Encounter (HOSPITAL_BASED_OUTPATIENT_CLINIC_OR_DEPARTMENT_OTHER): Payer: Self-pay | Admitting: Internal Medicine

## 2021-07-03 ENCOUNTER — Ambulatory Visit (HOSPITAL_BASED_OUTPATIENT_CLINIC_OR_DEPARTMENT_OTHER): Payer: Medicare PPO | Admitting: Internal Medicine

## 2021-07-03 VITALS — BP 136/72 | HR 97 | Ht 64.0 in | Wt 161.8 lb

## 2021-07-03 DIAGNOSIS — R55 Syncope and collapse: Secondary | ICD-10-CM | POA: Diagnosis not present

## 2021-07-03 DIAGNOSIS — R918 Other nonspecific abnormal finding of lung field: Secondary | ICD-10-CM

## 2021-07-03 DIAGNOSIS — E782 Mixed hyperlipidemia: Secondary | ICD-10-CM | POA: Diagnosis not present

## 2021-07-03 DIAGNOSIS — I77810 Thoracic aortic ectasia: Secondary | ICD-10-CM

## 2021-07-03 NOTE — Progress Notes (Signed)
OFFICE CONSULT NOTE  Chief Complaint:  Follow-up  Primary Care Physician: [name], PA-C  HPI:  [name] is a 71 y.o. female who is being seen today for the evaluation of syncope at the request of [name], [place].  This is a pleasant 71 year old female kindly referred for evaluation management of syncope.  She has had a number of recent episodes of syncope including one that happened during a meal while she was seated.  She did have somewhat of a prodrome prior to that and afterwards felt pale, cool and diaphoretic.  She has a history of hyponatremia followed by Dr. Loanne Drilling.  EMS work-up was unremarkable.  She was given fluids because her blood pressure was low with systolic in the 53Z.  She responded to that.  She reports a history of significant anxiety.  She also has heart disease in both parents.  She had lab work that showed a total cholesterol 273, HDL 119 LDL 144 and triglycerides 63.  She also has a history of essential hypertension with good blood pressure control.  07/30/2020  [name] returns today for follow-up.  She is done very well.  Fortunately she has had no more syncopal episodes recently.  She had a calcium score of 1 in April which was 45th percentile for age and sex matched control.  Not particularly high risk and I gave her the option of therapy for her lipids or watchful waiting.  She wanted to go ahead and start treatment and therefore I placed her on rosuvastatin.  LDL has been as high as 180 but her HDL was over 100 as well.  Subsequently repeat lipids have shown marked improvement with an LDL-P of 8034, HDL 108, small LDL-P less than 90, APO B of 67, total cholesterol 199, HDL 108, triglycerides 61 and LDL of 80.  Generally this is a very favorable lipid profile.  Unfortunately she was also found to have a dilated ascending aorta 42 mm.  This was a noncontrast study however would recommend a repeat which we have ordered for April of next  year.  07/03/2021  [name] is seen today in follow-up.  Her lipids are improved on low-dose rosuvastatin.  LDL is 78, HDL 98, LDL particle #746, down from 1034.  Overall she is tolerating this well.  This is a very favorable lipid profile.  She has not had follow-up CT of a dilated ascending aorta to 42 mm.  This was last assessed in April 2022.  In addition in November she had a fall and in the emergency department had scans of her neck and head.  This caught the top of the lung and noted to have multiple pulmonary nodules which was suggested to be followed up in a year.  Based on this I would advise that she have a contrast CT scan to evaluate both of these.  PMHx:  Past Medical History:  Diagnosis Date   Anxiety    takes Xanax daily    Arthritis    Breast cancer ([place])    Depression    takes Pristiq and Wellbutrin daily   GERD (gastroesophageal reflux disease)    History of radiation therapy    right breast  01/29/2021-03/09/2021  Dr [name]   Joint pain    Joint swelling    Nocturia    Seasonal allergies    Takes Zyrtec nightly along with Nasal Spray   Vitamin D deficiency    new script for Vit D to start 08/22/15  Past Surgical History:  Procedure Laterality Date   blephorplasty Bilateral    BREAST LUMPECTOMY WITH RADIOACTIVE SEED AND SENTINEL LYMPH NODE BIOPSY Right 12/03/2020   Procedure: RIGHT BREAST LUMPECTOMY WITH RADIOACTIVE SEEDS X3 AND AXILLARY SENTINEL LYMPH NODE BIOPSY;  Surgeon: [name], MD;  Location: [facility];  Service: General;  Laterality: Right;   BREAST RECONSTRUCTION Right 12/09/2020   Procedure: RIGHT ONCOPLASTIC BREAST RECONSTRUCTION;  Surgeon: Irene Limbo, MD;  Location: [facility];  Service: Plastics;  Laterality: Right;   BREAST REDUCTION SURGERY Left 12/09/2020   Procedure: LEFT MAMMARY REDUCTION  (BREAST);  Surgeon: Irene Limbo, MD;  Location: [facility];  Service: Plastics;   Laterality: Left;   COLONOSCOPY     left leg surgery     rod   TOTAL KNEE ARTHROPLASTY Right 09/01/2015   TOTAL KNEE ARTHROPLASTY Right 09/01/2015   Procedure: TOTAL KNEE ARTHROPLASTY;  Surgeon: Frederik Pear, MD;  Location: [facility];  Service: Orthopedics;  Laterality: Right;    FAMHx:  Family History  Problem Relation Age of Onset   Cancer Maternal Grandmother 2       colon cancer   Other Neg Hx        hyponatremia    SOCHx:   reports that she has never smoked. She has never used smokeless tobacco. She reports current alcohol use of about 2.0 standard drinks of alcohol per week. She reports that she does not use drugs.  ALLERGIES:  Allergies  Allergen Reactions   Codeine Nausea And Vomiting    Sick to stomach   Lisinopril Cough    Other reaction(s): Cough    ROS: Pertinent items noted in HPI and remainder of comprehensive ROS otherwise negative.  HOME MEDS: Current Outpatient Medications on File Prior to Visit  Medication Sig Dispense Refill   ALPRAZolam (XANAX) 0.5 MG tablet Take 0.5 mg by mouth 3 (three) times daily as needed for anxiety (pt reports taking TID).     anastrozole (ARIMIDEX) 1 MG tablet Take 1 tablet (1 mg total) by mouth daily. 30 tablet 3   azelastine (ASTELIN) 0.1 % nasal spray Place 2 sprays into both nostrils 2 (two) times daily. Use in each nostril as directed     buPROPion (WELLBUTRIN XL) 300 MG 24 hr tablet Take 1 tablet by mouth daily.     calcium carbonate (OS-CAL - DOSED IN MG OF ELEMENTAL CALCIUM) 1250 (500 Ca) MG tablet Take by mouth.     cetirizine (ZYRTEC) 10 MG tablet Take 10 mg by mouth at bedtime.      Cholecalciferol 50 MCG (2000 UT) TABS 1 tablet     demeclocycline (DECLOMYCIN) 300 MG tablet Take 1 tablet (300 mg total) by mouth daily. 90 tablet 3   desvenlafaxine (PRISTIQ) 50 MG 24 hr tablet Take 50 mg by mouth daily.      lansoprazole (PREVACID) 15 MG capsule Take 15 mg by mouth daily. At 6AM     Melatonin 10 MG TABS Take 10 mg by mouth  at bedtime.     rosuvastatin (CRESTOR) 5 MG tablet Take 1 tablet (5 mg total) by mouth daily. 90 tablet 3   telmisartan (MICARDIS) 80 MG tablet Take 80 mg by mouth daily.     zolpidem (AMBIEN) 10 MG tablet Take 10 mg by mouth at bedtime.     No current facility-administered medications on file prior to visit.    LABS/IMAGING: No results found for this or any previous visit (from the past  48 hour(s)). No results found.  LIPID PANEL: No results found for: "CHOL", "TRIG", "HDL", "CHOLHDL", "VLDL", "LDLCALC", "LDLDIRECT"  WEIGHTS: Wt Readings from Last 3 Encounters:  07/03/21 161 lb 12.8 oz (73.4 kg)  06/05/21 161 lb 14.4 oz (73.4 kg)  04/13/21 154 lb 3.2 oz (69.9 kg)    VITALS: BP 136/72   Pulse 97   Ht '5\' 4"'$  (1.626 m)   Wt 161 lb 12.8 oz (73.4 kg)   LMP  (LMP Unknown)   SpO2 97%   BMI 27.77 kg/m   EXAM: Deferred  EKG: Deferred  ASSESSMENT: Probable vasovagal syncope Chronic hyponatremia on demeclocycline Family history of heart disease Anxiety CAC score of 1-45th percentile (04/2020) Dilated ascending aorta 42 mm (04/2020) Multiple bilateral pulmonary nodules (by CT-11/2020)  PLAN: 1.   Ms. Gerety has reached a target cholesterol less than 100 with particle numbers now at goal on low-dose rosuvastatin.  She is tolerating it well.  She was noted to have a dilated ascending aorta and I will order a contrast CT scan to follow-up on this and this will give [place] an opportunity to look at her pulmonary nodules as well to make sure they are stable.  Continue current lipid therapy.  Plan follow-up with me annually or sooner as necessary.  Pixie Casino, MD, [facility], [place] Director of the Advanced Lipid Disorders &  Cardiovascular Risk Reduction Clinic Diplomate of the American Board of Clinical Lipidology Attending Cardiologist  Direct Dial: [phone]  Fax: [phone]  Website:  [email or web address]  Nadean Corwin Armour Villanueva 07/03/2021,  1:40 PM

## 2021-07-03 NOTE — Patient Instructions (Signed)
Medication Instructions:  NO CHANGES  *If you need a refill on your cardiac medications before your next appointment, please call your pharmacy*   Lab Work: Non-Fasting BMET prior to CT test FASTING lipid panel in 1 year  If you have labs (blood work) drawn today and your tests are completely normal, you will receive your results only by: [place] (if you have MyChart) OR A paper copy in the mail If you have any lab test that is abnormal or we need to change your treatment, we will call you to review the results.   Testing/Procedures: Chest CT - schedule at [facility]: At [facility], you and your health needs are our priority.  As part of our continuing mission to provide you with exceptional heart care, we have created designated Provider Care Teams.  These Care Teams include your primary Cardiologist (physician) and Advanced Practice Providers (APPs -  Physician Assistants and Nurse Practitioners) who all work together to provide you with the care you need, when you need it.  We recommend signing up for the patient portal called "MyChart".  Sign up information is provided on this After Visit Summary.  MyChart is used to connect with patients for Virtual Visits (Telemedicine).  Patients are able to view lab/test results, encounter notes, upcoming appointments, etc.  Non-urgent messages can be sent to your provider as well.   To learn more about what you can do with MyChart, go to [email or web address].    Your next appointment:   1 year with Dr. Debara Pickett

## 2021-07-10 ENCOUNTER — Ambulatory Visit (HOSPITAL_BASED_OUTPATIENT_CLINIC_OR_DEPARTMENT_OTHER)
Admission: RE | Admit: 2021-07-10 | Discharge: 2021-07-10 | Disposition: A | Discharge status: Home / Self Care | Payer: Medicare PPO | Source: Ambulatory Visit | Attending: Internal Medicine | Admitting: Internal Medicine

## 2021-07-10 DIAGNOSIS — I77810 Thoracic aortic ectasia: Secondary | ICD-10-CM | POA: Diagnosis not present

## 2021-07-10 DIAGNOSIS — R918 Other nonspecific abnormal finding of lung field: Secondary | ICD-10-CM | POA: Insufficient documentation

## 2021-07-10 MED ORDER — IOHEXOL 300 MG/ML  SOLN
100.0000 mL | Freq: Once | INTRAMUSCULAR | Status: AC | PRN
  Administered 2021-07-10: 75 mL via INTRAVENOUS

## 2021-07-26 ENCOUNTER — Other Ambulatory Visit: Payer: Self-pay | Admitting: Hematology

## 2021-08-31 ENCOUNTER — Ambulatory Visit: Payer: Medicare PPO | Attending: General Surgery

## 2021-08-31 VITALS — Wt 148.2 lb

## 2021-08-31 DIAGNOSIS — Z483 Aftercare following surgery for neoplasm: Secondary | ICD-10-CM | POA: Insufficient documentation

## 2021-08-31 NOTE — Therapy (Signed)
OUTPATIENT PHYSICAL THERAPY SOZO SCREENING NOTE   Patient Name: [name] MRN: [id number] DOB:08/18/1950, 71 y.o., female Today's Date: 08/31/2021  PCP: [name], PA-C REFERRING PROVIDER: [name], MD   PT End of Session - 08/31/21 1522     Visit Number 2   # unchanged due to screen only   PT Start Time 1519    PT Stop Time 1524    PT Time Calculation (min) 5 min    Activity Tolerance Patient tolerated treatment well    Behavior During Therapy [facility] for tasks assessed/performed             Past Medical History:  Diagnosis Date   Anxiety    takes Xanax daily    Arthritis    Breast cancer ([place])    Depression    takes Pristiq and Wellbutrin daily   GERD (gastroesophageal reflux disease)    History of radiation therapy    right breast  01/29/2021-03/09/2021  Dr [name]   Joint pain    Joint swelling    Nocturia    Seasonal allergies    Takes Zyrtec nightly along with Nasal Spray   Vitamin D deficiency    new script for Vit D to start 08/22/15   Past Surgical History:  Procedure Laterality Date   blephorplasty Bilateral    BREAST LUMPECTOMY WITH RADIOACTIVE SEED AND SENTINEL LYMPH NODE BIOPSY Right 12/03/2020   Procedure: RIGHT BREAST LUMPECTOMY WITH RADIOACTIVE SEEDS X3 AND AXILLARY SENTINEL LYMPH NODE BIOPSY;  Surgeon: [name], MD;  Location: [place];  Service: General;  Laterality: Right;   BREAST RECONSTRUCTION Right 12/09/2020   Procedure: RIGHT ONCOPLASTIC BREAST RECONSTRUCTION;  Surgeon: Irene Limbo, MD;  Location: [place];  Service: Plastics;  Laterality: Right;   BREAST REDUCTION SURGERY Left 12/09/2020   Procedure: LEFT MAMMARY REDUCTION  (BREAST);  Surgeon: Irene Limbo, MD;  Location: [facility];  Service: Plastics;  Laterality: Left;   COLONOSCOPY     left leg surgery     rod   TOTAL KNEE ARTHROPLASTY Right 09/01/2015   TOTAL KNEE ARTHROPLASTY Right 09/01/2015    Procedure: TOTAL KNEE ARTHROPLASTY;  Surgeon: Frederik Pear, MD;  Location: [place];  Service: Orthopedics;  Laterality: Right;   Patient Active Problem List   Diagnosis Date Noted   Malignant neoplasm of lower-inner quadrant of right breast of female, estrogen receptor positive ([place]) 10/24/2020   Anxiety 10/28/2016   Allergic rhinitis 10/28/2016   HTN (hypertension) 10/28/2016   Constipation 10/28/2016   Insomnia 10/28/2016   Vitamin D deficiency 10/28/2016   Hyponatremia 10/26/2016   Primary osteoarthritis of right knee 08/28/2015    REFERRING DIAG: right breast cancer at risk for lymphedema  THERAPY DIAG: Aftercare following surgery for neoplasm  PERTINENT HISTORY: Patient was diagnosed on 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 She underwent a right lumpectomy and sentinel node biopsy (4 nodes all negative except one with isolated tumor cells) on 12/03/2020. She then underwent a bilateral breast reduction on 12/09/2020.. It is ER/PR positive and HER2 negative with a Ki67 of 5%.   PRECAUTIONS: right UE Lymphedema risk, None  SUBJECTIVE: Pt returns for her 3 month L-Dex screen.   PAIN:  Are you having pain? No  SOZO SCREENING: Patient was assessed today using the SOZO machine to determine the lymphedema index score. This was compared to her baseline score. It was determined that she is within the recommended range when compared to her baseline and no  further action is needed at this time. She will continue SOZO screenings. These are done every 3 months for 2 years post operatively followed by every 6 months for 2 years, and then annually.   L-DEX FLOWSHEETS - 08/31/21 1500       L-DEX LYMPHEDEMA SCREENING   Measurement Type Unilateral    L-DEX MEASUREMENT EXTREMITY Upper Extremity    POSITION  Standing    DOMINANT SIDE Right    At Risk Side Right    BASELINE SCORE (UNILATERAL) 0.7    L-DEX SCORE (UNILATERAL) 3.4    VALUE CHANGE (UNILAT) 2.7               Otelia Limes, PTA 08/31/2021, 3:25 PM

## 2021-09-03 ENCOUNTER — Other Ambulatory Visit: Payer: Self-pay

## 2021-09-03 DIAGNOSIS — C50311 Malignant neoplasm of lower-inner quadrant of right female breast: Secondary | ICD-10-CM

## 2021-09-04 ENCOUNTER — Encounter: Payer: Self-pay | Admitting: Hematology

## 2021-09-04 ENCOUNTER — Inpatient Hospital Stay: Payer: Medicare PPO | Attending: Radiation Oncology

## 2021-09-04 ENCOUNTER — Other Ambulatory Visit: Payer: Self-pay

## 2021-09-04 ENCOUNTER — Inpatient Hospital Stay: Payer: Medicare PPO | Admitting: Hematology

## 2021-09-04 VITALS — BP 125/81 | HR 92 | Temp 97.7°F | Resp 17 | Ht 64.0 in | Wt 149.4 lb

## 2021-09-04 DIAGNOSIS — Z170 Estrogen receptor positive status [ER+]: Secondary | ICD-10-CM | POA: Diagnosis not present

## 2021-09-04 DIAGNOSIS — Z923 Personal history of irradiation: Secondary | ICD-10-CM | POA: Insufficient documentation

## 2021-09-04 DIAGNOSIS — D649 Anemia, unspecified: Secondary | ICD-10-CM | POA: Diagnosis not present

## 2021-09-04 DIAGNOSIS — Z888 Allergy status to other drugs, medicaments and biological substances status: Secondary | ICD-10-CM | POA: Insufficient documentation

## 2021-09-04 DIAGNOSIS — Z79899 Other long term (current) drug therapy: Secondary | ICD-10-CM | POA: Insufficient documentation

## 2021-09-04 DIAGNOSIS — M85851 Other specified disorders of bone density and structure, right thigh: Secondary | ICD-10-CM | POA: Diagnosis not present

## 2021-09-04 DIAGNOSIS — C50311 Malignant neoplasm of lower-inner quadrant of right female breast: Secondary | ICD-10-CM

## 2021-09-04 DIAGNOSIS — Z885 Allergy status to narcotic agent status: Secondary | ICD-10-CM | POA: Diagnosis not present

## 2021-09-04 DIAGNOSIS — F419 Anxiety disorder, unspecified: Secondary | ICD-10-CM | POA: Diagnosis not present

## 2021-09-04 DIAGNOSIS — Z79811 Long term (current) use of aromatase inhibitors: Secondary | ICD-10-CM | POA: Diagnosis not present

## 2021-09-04 LAB — CBC WITH DIFFERENTIAL (CANCER CENTER ONLY)
Abs Immature Granulocytes: 0.01 10*3/uL (ref 0.00–0.07)
Basophils Absolute: 0 10*3/uL (ref 0.0–0.1)
Basophils Relative: 1 %
Eosinophils Absolute: 0.3 10*3/uL (ref 0.0–0.5)
Eosinophils Relative: 5 %
HCT: 32 % — ABNORMAL LOW (ref 36.0–46.0)
Hemoglobin: 11.3 g/dL — ABNORMAL LOW (ref 12.0–15.0)
Immature Granulocytes: 0 %
Lymphocytes Relative: 20 %
Lymphs Abs: 1.1 10*3/uL (ref 0.7–4.0)
MCH: 32.6 pg (ref 26.0–34.0)
MCHC: 35.3 g/dL (ref 30.0–36.0)
MCV: 92.2 fL (ref 80.0–100.0)
Monocytes Absolute: 0.4 10*3/uL (ref 0.1–1.0)
Monocytes Relative: 8 %
Neutro Abs: 3.7 10*3/uL (ref 1.7–7.7)
Neutrophils Relative %: 66 %
Platelet Count: 309 10*3/uL (ref 150–400)
RBC: 3.47 MIL/uL — ABNORMAL LOW (ref 3.87–5.11)
RDW: 12.7 % (ref 11.5–15.5)
WBC Count: 5.5 10*3/uL (ref 4.0–10.5)
nRBC: 0 % (ref 0.0–0.2)

## 2021-09-04 LAB — CMP (CANCER CENTER ONLY)
ALT: 9 U/L (ref 0–44)
AST: 14 U/L — ABNORMAL LOW (ref 15–41)
Albumin: 4.4 g/dL (ref 3.5–5.0)
Alkaline Phosphatase: 77 U/L (ref 38–126)
Anion gap: 4 — ABNORMAL LOW (ref 5–15)
BUN: 13 mg/dL (ref 8–23)
CO2: 29 mmol/L (ref 22–32)
Calcium: 9.8 mg/dL (ref 8.9–10.3)
Chloride: 103 mmol/L (ref 98–111)
Creatinine: 0.91 mg/dL (ref 0.44–1.00)
GFR, Estimated: >60 mL/min (ref >60)
Glucose, Bld: 87 mg/dL (ref 70–99)
Potassium: 4.4 mmol/L (ref 3.5–5.1)
Sodium: 136 mmol/L (ref 135–145)
Total Bilirubin: 0.5 mg/dL (ref 0.3–1.2)
Total Protein: 6.8 g/dL (ref 6.5–8.1)

## 2021-09-04 NOTE — Progress Notes (Signed)
[place]   [phone] [phone]) [phone]   Clinic Follow up Note   Patient Care Team: [name], PA-C as PCP - General (Family Medicine) Truitt Merle, MD as Consulting Physician (Hematology) [name], MD as Consulting Physician (General Surgery) [name], MD as Consulting Physician (Radiation Oncology)  Date of Service:  09/04/2021  CHIEF COMPLAINT: f/u of right breast cancer  CURRENT THERAPY:  Anastrozole, starting 03/2021  ASSESSMENT & PLAN:  [name] is a 71 y.o. post-menopausal female with   1. 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 S/p right lumpectomy on 12/03/20 under Dr. Donne Hazel revealed multifocal invasive lobular carcinoma, 0.9 cm and 0.5 cm. -Oncotype RS of 23, low risk -s/p radiation 01/29/21 - 03/09/21 under Dr. Sondra Come -she started anastrozole in late 03/2021. Plan for 10 years due to lobular histology. She is tolerating well with no noticeable side effects. -she is clinically doing very well. Labs reviewed, she has mild anemia with hgb 11.3, otherwise WNL. Physical exam was unremarkable. There is no clinical concern for recurrence. -she will be due for mammogram at the end of this month. She knows to call [facility] and schedule.   2. Bone Health  -repeat DEXA on 11/05/20 at [facility] showed osteopenia (T-score of -1.2 at right hip) -will monitor every 2 years on AI   3. New anemia  -Hg 11.3 today, it was 17.6 in October 2022.  She had a history of iron deficient anemia in the past. -She is scheduled to have lab and follow-up with her PCP in late September, I suggested her to check iron and ferritin levels with her PCP   PLAN:  -will forward my note to her PCP, per her request, to add iron and ferritin in her lab work with PCP in a month  -continue anastrozole -mammogram due late 09/2021 -lab and f/u with  APP in 6 months   No problem-specific Assessment & Plan notes found for this encounter.   SUMMARY OF ONCOLOGIC HISTORY: Oncology History Overview Note   Cancer Staging  Malignant neoplasm of lower-inner quadrant of right breast of female, estrogen receptor positive ([place]) Staging form: Breast, AJCC 8th Edition - Clinical stage from 10/16/2020: Stage IA (cT1b, cN0, cM0, G2, ER+, PR+, HER2-) - Signed by Truitt Merle, MD on 10/29/2020 - Pathologic stage from 12/03/2020: Stage IA (pT1c, pN0(i+), cM0, G2, ER+, PR+, HER2-, Oncotype DX score: 23) - Signed by Truitt Merle, MD on 03/05/2021     Malignant neoplasm of lower-inner quadrant of right breast of female, estrogen receptor positive ([place])  10/08/2020 Mammogram   Right Diagnostic MM and Right Breast US  -Three irregular masses in lower-inner right breast:   -8 mm at 5-6 o'clock, 4 mm by [place]   -4 mm at 5 o'clock in anterior depth, 2 mm by [place]   -5 mm at 5 o'clock middle depth, 3 mm by [place] -Stable oval mass in right breast at 4 o'clock is benign -[place] of right axilla demonstrates mildly prominent right axillary lymph nodes but no definite lymphadenopathy.    10/16/2020 Cancer Staging   Staging form: Breast, AJCC 8th Edition - Clinical stage from 10/16/2020: Stage IA (cT1b, cN0, cM0, G2, ER+, PR+, HER2-) - Signed by Truitt Merle, MD on 10/29/2020 Stage prefix: Initial diagnosis Histologic grading system: 3 grade system   10/16/2020 Pathology Results   Diagnosis 1. Breast, right, needle core biopsy, 6 o'clock mass -  INVASIVE MAMMARY CARCINOMA. SEE NOTE - MAMMARY CARCINOMA IN SITU 2. Breast, right, needle core biopsy, 4 o'clock mass - INVASIVE MAMMARY CARCINOMA. SEE NOTE 3. Breast, right, needle core biopsy, 5 o'clock mass - INVASIVE MAMMARY CARCINOMA. SEE NOTE - MAMMARY CARCINOMA IN SITU Diagnosis Note 1. , 2 and 3. Carcinoma measures 0.5 cm in part 1, 0.4 cm in part 2 and 0.2 cm in part 3; and appears grade 2.  ADDENDUM: 1. ,2 and 3.  Immunohistochemical stain for E-cadherin is negative in the tumor cells, consistent with a lobular phenotype.  1. PROGNOSTIC INDICATORS Results: The tumor cells are NEGATIVE for Her2 (1+). Estrogen Receptor: 100%, POSITIVE, STRONG STAINING INTENSITY Progesterone Receptor: 50%, POSITIVE, MODERATE STAINING INTENSITY Proliferation Marker Ki67: 5%   10/24/2020 Initial Diagnosis   Malignant neoplasm of lower-inner quadrant of right breast of female, estrogen receptor positive ([place])   12/03/2020 Cancer Staging   Staging form: Breast, AJCC 8th Edition - Pathologic stage from 12/03/2020: Stage IA (pT1c, pN0(i+), cM0, G2, ER+, PR+, HER2-, Oncotype DX score: 23) - Signed by Truitt Merle, MD on 03/05/2021 Stage prefix: Initial diagnosis Multigene prognostic tests performed: Oncotype DX Recurrence score range: Greater than or equal to 11 Histologic grading system: 3 grade system   12/03/2020 Definitive Surgery   FINAL MICROSCOPIC DIAGNOSIS:   A. 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 BREAST, RIGHT MEDIAL MARGIN, EXCISION:  -  Focal residual invasive and in situ lobular carcinoma  -  Margins uninvolved by carcinoma (0.6 cm)   C. BREAST, RIGHT INFERIOR MARGIN, EXCISION:  -  Focal residual lobular carcinoma in situ  -  Margins uninvolved by carcinoma   D. BREAST, RIGHT LATERAL MARGIN, EXCISION:  -  No residual carcinoma identified   E. BREAST, RIGHT SUPERIOR MARGIN, EXCISION:  -  No residual carcinoma identified   F. BREAST, RIGHT POSTERIOR MARGIN, EXCISION:  -  No residual carcinoma identified   G. LYMPH NODE, RIGHT AXILLARY, SENTINEL, EXCISION:  -  No carcinoma identified in one lymph node (0/1)  -  See comment   H. LYMPH NODE, RIGHT  AXILLARY, SENTINEL, EXCISION:  -  No carcinoma identified in one lymph node (0/1)  -  See comment   I. LYMPH NODE, RIGHT AXILLARY, SENTINEL, EXCISION:  -  No carcinoma identified in one lymph node (0/1)  -  See comment   J. LYMPH NODE, RIGHT AXILLARY, SENTINEL, EXCISION:  -  Isolated tumor cells present in one lymph node (0i/1)  -  See comment    12/03/2020 Oncotype testing   Oncotype DX was obtained on the final surgical sample and the recurrence score of 23 predicts a risk of recurrence outside the breast over the next 9 years of 9%, if the patient's only systemic therapy is an antiestrogen for 5 years.  It also predicts no benefit from chemotherapy.    01/29/2021 - 03/09/2021 Anti-estrogen oral therapy   01/29/2021 through 03/09/2021 Site Technique Total Dose (Gy) Dose per Fx (Gy) Completed Fx Beam Energies  Breast, Right: Breast_R 3D 50.4/50.4 1.8 28/28 6X    04/2021 -  Anti-estrogen oral therapy   Anastrozole daily      INTERVAL HISTORY:  [name] is here for a follow up of  breast cancer. She was last seen by me on 03/05/21 with survivorship in the interim. She presents to the clinic alone. She reports she is doing well overall. She denies any hot flashes or joint pain from anastrozole.   All other systems were reviewed with the patient and are negative.  MEDICAL HISTORY:  Past Medical History:  Diagnosis Date   Anxiety    takes Xanax daily    Arthritis    Breast cancer ([place])    Depression    takes Pristiq and Wellbutrin daily   GERD (gastroesophageal reflux disease)    History of radiation therapy    right breast  01/29/2021-03/09/2021  Dr [name]   Joint pain    Joint swelling    Nocturia    Seasonal allergies    Takes Zyrtec nightly along with Nasal Spray   Vitamin D deficiency    new script for Vit D to start 08/22/15    SURGICAL HISTORY: Past Surgical History:  Procedure Laterality Date   blephorplasty Bilateral    BREAST LUMPECTOMY WITH RADIOACTIVE SEED  AND SENTINEL LYMPH NODE BIOPSY Right 12/03/2020   Procedure: RIGHT BREAST LUMPECTOMY WITH RADIOACTIVE SEEDS X3 AND AXILLARY SENTINEL LYMPH NODE BIOPSY;  Surgeon: [name], MD;  Location: [place];  Service: General;  Laterality: Right;   BREAST RECONSTRUCTION Right 12/09/2020   Procedure: RIGHT ONCOPLASTIC BREAST RECONSTRUCTION;  Surgeon: Irene Limbo, MD;  Location: [place];  Service: Plastics;  Laterality: Right;   BREAST REDUCTION SURGERY Left 12/09/2020   Procedure: LEFT MAMMARY REDUCTION  (BREAST);  Surgeon: Irene Limbo, MD;  Location: [place];  Service: Plastics;  Laterality: Left;   COLONOSCOPY     left leg surgery     rod   TOTAL KNEE ARTHROPLASTY Right 09/01/2015   TOTAL KNEE ARTHROPLASTY Right 09/01/2015   Procedure: TOTAL KNEE ARTHROPLASTY;  Surgeon: Frederik Pear, MD;  Location: [place];  Service: Orthopedics;  Laterality: Right;    I have reviewed the social history and family history with the patient and they are unchanged from previous note.  ALLERGIES:  is allergic to codeine and lisinopril.  MEDICATIONS:  Current Outpatient Medications  Medication Sig Dispense Refill   ALPRAZolam (XANAX) 0.5 MG tablet Take 0.5 mg by mouth 3 (three) times daily as needed for anxiety (pt reports taking TID).     anastrozole (ARIMIDEX) 1 MG tablet TAKE 1 TABLET BY MOUTH EVERY DAY 90 tablet 1   azelastine (ASTELIN) 0.1 % nasal spray Place 2 sprays into both nostrils 2 (two) times daily. Use in each nostril as directed     buPROPion (WELLBUTRIN XL) 300 MG 24 hr tablet Take 1 tablet by mouth daily.     calcium carbonate (OS-CAL - DOSED IN MG OF ELEMENTAL CALCIUM) 1250 (500 Ca) MG tablet Take by mouth.     cetirizine (ZYRTEC) 10 MG tablet Take 10 mg by mouth at bedtime.      Cholecalciferol 50 MCG (2000 UT) TABS 1 tablet     demeclocycline (DECLOMYCIN) 300 MG tablet Take 1 tablet (300 mg total) by mouth daily. 90 tablet 3    desvenlafaxine (PRISTIQ) 50 MG 24 hr tablet Take 50 mg by mouth daily.      lansoprazole (PREVACID) 15 MG capsule Take 15 mg by mouth daily. At 6AM     Melatonin 10 MG TABS Take 10 mg by mouth at bedtime.     rosuvastatin (CRESTOR) 5 MG tablet Take 1 tablet (5 mg total)  by mouth daily. 90 tablet 3   telmisartan (MICARDIS) 80 MG tablet Take 80 mg by mouth daily.     zolpidem (AMBIEN) 10 MG tablet Take 10 mg by mouth at bedtime.     No current facility-administered medications for this visit.    PHYSICAL EXAMINATION: ECOG PERFORMANCE STATUS: 0 - Asymptomatic  Vitals:   09/04/21 1453  BP: 125/81  Pulse: 92  Resp: 17  Temp: 97.7 F (36.5 C)  SpO2: 100%   Wt Readings from Last 3 Encounters:  09/04/21 149 lb 6.4 oz (67.8 kg)  08/31/21 148 lb 4 oz (67.2 kg)  07/03/21 161 lb 12.8 oz (73.4 kg)     GENERAL:alert, no distress and comfortable SKIN: skin color, texture, turgor are normal, no rashes or significant lesions EYES: normal, Conjunctiva are pink and non-injected, sclera clear  NECK: supple, thyroid normal size, non-tender, without nodularity LYMPH:  no palpable lymphadenopathy in the cervical, axillary LUNGS: clear to auscultation and percussion with normal breathing effort HEART: regular rate & rhythm and no murmurs and no lower extremity edema ABDOMEN:abdomen soft, non-tender and normal bowel sounds Musculoskeletal:no cyanosis of digits and no clubbing  NEURO: alert & oriented x 3 with fluent speech, no focal motor/sensory deficits BREAST: No palpable mass, nodules or adenopathy bilaterally. Breast exam benign.   LABORATORY DATA:  I have reviewed the data as listed    Latest Ref Rng & Units 09/04/2021    2:24 PM 10/29/2020   12:12 PM 03/08/2020    5:09 PM  CBC  WBC 4.0 - 10.5 K/uL 5.5  6.2  10.9   Hemoglobin 12.0 - 15.0 g/dL 11.3  13.6  12.0   Hematocrit 36.0 - 46.0 % 32.0  39.9  35.2   Platelets 150 - 400 K/uL 309  352  382         Latest Ref Rng & Units 09/04/2021     2:24 PM 03/06/2021    3:15 PM 10/29/2020   12:12 PM  CMP  Glucose 70 - 99 mg/dL 87  102  54   BUN 8 - 23 mg/dL $Remove'13  16  18   'DSCEdhZ$ Creatinine 0.44 - 1.00 mg/dL 0.91  0.91  0.99   Sodium 135 - 145 mmol/L 136  138  139   Potassium 3.5 - 5.1 mmol/L 4.4  4.4  4.1   Chloride 98 - 111 mmol/L 103  103  103   CO2 22 - 32 mmol/L $RemoveB'29  27  25   'LDZEnnLV$ Calcium 8.9 - 10.3 mg/dL 9.8  9.9  10.0   Total Protein 6.5 - 8.1 g/dL 6.8   7.7   Total Bilirubin 0.3 - 1.2 mg/dL 0.5   0.6   Alkaline Phos 38 - 126 U/L 77   96   AST 15 - 41 U/L 14   19   ALT 0 - 44 U/L 9   12       RADIOGRAPHIC STUDIES: I have personally reviewed the radiological images as listed and agreed with the findings in the report. No results found.    No orders of the defined types were placed in this encounter.  All questions were answered. The patient knows to call the clinic with any problems, questions or concerns. No barriers to learning was detected. The total time spent in the appointment was 30 minutes.     Truitt Merle, MD 09/04/2021   I, Wilburn Mylar, am acting as scribe for Truitt Merle, MD.   I have reviewed the above documentation  for accuracy and completeness, and I agree with the above.     

## 2021-09-04 NOTE — Progress Notes (Signed)
Pt Dr. Ernestina Penna request, Epic faxed Dr. Ernestina Penna office note from today's visit to pt's PCP.  Requested if iron and ferritin labs could be drawn at the pt's next office visit with the PCP.  Requested if the lab results could be faxed to Dr. Ernestina Penna office.  Epic fax confirmation was received.

## 2021-09-08 ENCOUNTER — Other Ambulatory Visit: Payer: Self-pay

## 2021-09-23 DIAGNOSIS — Z853 Personal history of malignant neoplasm of breast: Secondary | ICD-10-CM | POA: Diagnosis not present

## 2021-09-23 DIAGNOSIS — Z923 Personal history of irradiation: Secondary | ICD-10-CM | POA: Diagnosis not present

## 2021-09-23 DIAGNOSIS — Z9889 Other specified postprocedural states: Secondary | ICD-10-CM | POA: Diagnosis not present

## 2021-09-30 DIAGNOSIS — H18233 Secondary corneal edema, bilateral: Secondary | ICD-10-CM | POA: Diagnosis not present

## 2021-09-30 DIAGNOSIS — H2513 Age-related nuclear cataract, bilateral: Secondary | ICD-10-CM | POA: Diagnosis not present

## 2021-10-01 DIAGNOSIS — Z0000 Encounter for general adult medical examination without abnormal findings: Secondary | ICD-10-CM | POA: Diagnosis not present

## 2021-10-01 DIAGNOSIS — Z6825 Body mass index (BMI) 25.0-25.9, adult: Secondary | ICD-10-CM | POA: Diagnosis not present

## 2021-10-01 DIAGNOSIS — Z23 Encounter for immunization: Secondary | ICD-10-CM | POA: Diagnosis not present

## 2021-10-01 DIAGNOSIS — R7301 Impaired fasting glucose: Secondary | ICD-10-CM | POA: Diagnosis not present

## 2021-10-01 DIAGNOSIS — J309 Allergic rhinitis, unspecified: Secondary | ICD-10-CM | POA: Diagnosis not present

## 2021-10-01 DIAGNOSIS — E7800 Pure hypercholesterolemia, unspecified: Secondary | ICD-10-CM | POA: Diagnosis not present

## 2021-10-01 DIAGNOSIS — E871 Hypo-osmolality and hyponatremia: Secondary | ICD-10-CM | POA: Diagnosis not present

## 2021-10-01 DIAGNOSIS — I10 Essential (primary) hypertension: Secondary | ICD-10-CM | POA: Diagnosis not present

## 2021-10-01 DIAGNOSIS — F419 Anxiety disorder, unspecified: Secondary | ICD-10-CM | POA: Diagnosis not present

## 2021-10-02 ENCOUNTER — Other Ambulatory Visit: Payer: Self-pay | Admitting: Family Medicine

## 2021-10-02 ENCOUNTER — Ambulatory Visit
Admission: RE | Admit: 2021-10-02 | Discharge: 2021-10-02 | Disposition: A | Discharge status: Home / Self Care | Payer: Medicare PPO | Source: Ambulatory Visit | Attending: Family Medicine | Admitting: Family Medicine

## 2021-10-02 DIAGNOSIS — M79671 Pain in right foot: Secondary | ICD-10-CM

## 2021-10-02 DIAGNOSIS — S99921A Unspecified injury of right foot, initial encounter: Secondary | ICD-10-CM

## 2021-10-02 DIAGNOSIS — R600 Localized edema: Secondary | ICD-10-CM | POA: Diagnosis not present

## 2021-10-07 DIAGNOSIS — Z853 Personal history of malignant neoplasm of breast: Secondary | ICD-10-CM | POA: Diagnosis not present

## 2021-11-12 DIAGNOSIS — G8929 Other chronic pain: Secondary | ICD-10-CM | POA: Diagnosis not present

## 2021-11-12 DIAGNOSIS — M25571 Pain in right ankle and joints of right foot: Secondary | ICD-10-CM | POA: Diagnosis not present

## 2021-11-18 DIAGNOSIS — M25571 Pain in right ankle and joints of right foot: Secondary | ICD-10-CM | POA: Diagnosis not present

## 2021-11-18 DIAGNOSIS — M79671 Pain in right foot: Secondary | ICD-10-CM | POA: Diagnosis not present

## 2021-12-04 DIAGNOSIS — E871 Hypo-osmolality and hyponatremia: Secondary | ICD-10-CM | POA: Diagnosis not present

## 2021-12-04 DIAGNOSIS — Z853 Personal history of malignant neoplasm of breast: Secondary | ICD-10-CM | POA: Diagnosis not present

## 2021-12-04 DIAGNOSIS — R918 Other nonspecific abnormal finding of lung field: Secondary | ICD-10-CM | POA: Diagnosis not present

## 2021-12-04 DIAGNOSIS — K760 Fatty (change of) liver, not elsewhere classified: Secondary | ICD-10-CM | POA: Diagnosis not present

## 2021-12-14 ENCOUNTER — Ambulatory Visit: Payer: Medicare PPO | Attending: General Surgery

## 2021-12-14 VITALS — Wt 141.0 lb

## 2021-12-14 DIAGNOSIS — Z483 Aftercare following surgery for neoplasm: Secondary | ICD-10-CM | POA: Insufficient documentation

## 2021-12-14 NOTE — Therapy (Signed)
OUTPATIENT PHYSICAL THERAPY SOZO SCREENING NOTE   Patient Name: [name] MRN: [id number] DOB:1950/12/04, 71 y.o., female Today's Date: 12/14/2021  PCP: [name], PA-C REFERRING PROVIDER: [name], MD   PT End of Session - 12/14/21 1525     Visit Number 2   # unchanged due to screen only   PT Start Time 1522    PT Stop Time 1527    PT Time Calculation (min) 5 min    Activity Tolerance Patient tolerated treatment well    Behavior During Therapy [facility] for tasks assessed/performed             Past Medical History:  Diagnosis Date   Anxiety    takes Xanax daily    Arthritis    Breast cancer ([place])    Depression    takes Pristiq and Wellbutrin daily   GERD (gastroesophageal reflux disease)    History of radiation therapy    right breast  01/29/2021-03/09/2021  Dr [name]   Joint pain    Joint swelling    Nocturia    Seasonal allergies    Takes Zyrtec nightly along with Nasal Spray   Vitamin D deficiency    new script for Vit D to start 08/22/15   Past Surgical History:  Procedure Laterality Date   blephorplasty Bilateral    BREAST LUMPECTOMY WITH RADIOACTIVE SEED AND SENTINEL LYMPH NODE BIOPSY Right 12/03/2020   Procedure: RIGHT BREAST LUMPECTOMY WITH RADIOACTIVE SEEDS X3 AND AXILLARY SENTINEL LYMPH NODE BIOPSY;  Surgeon: [name], MD;  Location: [place];  Service: General;  Laterality: Right;   BREAST RECONSTRUCTION Right 12/09/2020   Procedure: RIGHT ONCOPLASTIC BREAST RECONSTRUCTION;  Surgeon: Irene Limbo, MD;  Location: [place];  Service: Plastics;  Laterality: Right;   BREAST REDUCTION SURGERY Left 12/09/2020   Procedure: LEFT MAMMARY REDUCTION  (BREAST);  Surgeon: Irene Limbo, MD;  Location: [place];  Service: Plastics;  Laterality: Left;   COLONOSCOPY     left leg surgery     rod   TOTAL KNEE ARTHROPLASTY Right 09/01/2015   TOTAL KNEE ARTHROPLASTY Right 09/01/2015    Procedure: TOTAL KNEE ARTHROPLASTY;  Surgeon: Frederik Pear, MD;  Location: [place];  Service: Orthopedics;  Laterality: Right;   Patient Active Problem List   Diagnosis Date Noted   Malignant neoplasm of lower-inner quadrant of right breast of female, estrogen receptor positive ([place]) 10/24/2020   Anxiety 10/28/2016   Allergic rhinitis 10/28/2016   HTN (hypertension) 10/28/2016   Constipation 10/28/2016   Insomnia 10/28/2016   Vitamin D deficiency 10/28/2016   Hyponatremia 10/26/2016   Primary osteoarthritis of right knee 08/28/2015    REFERRING DIAG: right breast cancer at risk for lymphedema  THERAPY DIAG: Aftercare following surgery for neoplasm  PERTINENT HISTORY: Patient was diagnosed on 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 She underwent a right lumpectomy and sentinel node biopsy (4 nodes all negative except one with isolated tumor cells) on 12/03/2020. She then underwent a bilateral breast reduction on 12/09/2020.. It is ER/PR positive and HER2 negative with a Ki67 of 5%.   PRECAUTIONS: right UE Lymphedema risk, None  SUBJECTIVE: Pt returns for her 3 month L-Dex screen.   PAIN:  Are you having pain? No  SOZO SCREENING: Patient was assessed today using the SOZO machine to determine the lymphedema index score. This was compared to her baseline score. It was determined that she is within the recommended range when compared to her baseline and no  further action is needed at this time. She will continue SOZO screenings. These are done every 3 months for 2 years post operatively followed by every 6 months for 2 years, and then annually.   L-DEX FLOWSHEETS - 12/14/21 1500       L-DEX LYMPHEDEMA SCREENING   Measurement Type Unilateral    L-DEX MEASUREMENT EXTREMITY Upper Extremity    POSITION  Standing    DOMINANT SIDE Right    At Risk Side Right    BASELINE SCORE (UNILATERAL) 0.7    L-DEX SCORE (UNILATERAL) -2.3    VALUE CHANGE (UNILAT) -3               Otelia Limes, PTA 12/14/2021, 3:26 PM

## 2021-12-22 DIAGNOSIS — M79671 Pain in right foot: Secondary | ICD-10-CM | POA: Diagnosis not present

## 2022-01-08 DIAGNOSIS — B0089 Other herpesviral infection: Secondary | ICD-10-CM | POA: Diagnosis not present

## 2022-01-08 DIAGNOSIS — D225 Melanocytic nevi of trunk: Secondary | ICD-10-CM | POA: Diagnosis not present

## 2022-01-08 DIAGNOSIS — Z1283 Encounter for screening for malignant neoplasm of skin: Secondary | ICD-10-CM | POA: Diagnosis not present

## 2022-01-11 DIAGNOSIS — E871 Hypo-osmolality and hyponatremia: Secondary | ICD-10-CM | POA: Diagnosis not present

## 2022-01-15 DIAGNOSIS — R918 Other nonspecific abnormal finding of lung field: Secondary | ICD-10-CM | POA: Diagnosis not present

## 2022-01-15 DIAGNOSIS — E871 Hypo-osmolality and hyponatremia: Secondary | ICD-10-CM | POA: Diagnosis not present

## 2022-01-15 DIAGNOSIS — Z853 Personal history of malignant neoplasm of breast: Secondary | ICD-10-CM | POA: Diagnosis not present

## 2022-01-21 ENCOUNTER — Other Ambulatory Visit: Payer: Self-pay | Admitting: Hematology

## 2022-03-03 IMAGING — CT CT MAXILLOFACIAL W/O CM
3 of 5 series · 14 of 47 positions shown, 17 images · non-contrast
Comparison: CT head 03/08/2020.

CLINICAL DATA: Facial trauma.  Fall.

EXAM:
CT HEAD WITHOUT CONTRAST
CT MAXILLOFACIAL WITHOUT CONTRAST
CT CERVICAL SPINE WITHOUT CONTRAST
TECHNIQUE: Multidetector CT imaging of the head, cervical spine, and
maxillofacial structures were performed using the standard protocol
without intravenous contrast. Multiplanar CT image reconstructions
of the cervical spine and maxillofacial structures were also
generated.

[Series 2: 1 max soft · axial · 0.43mm/px · z∈[-266,-132]mm · 9 of 79 slices shown, 12 images]
[im 6/79  brain]
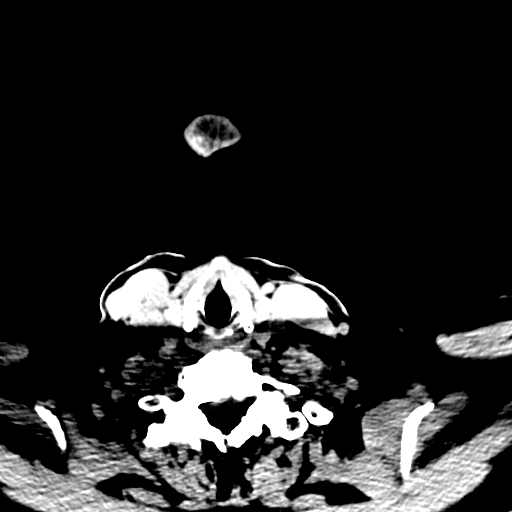
[im 6/79  bone]
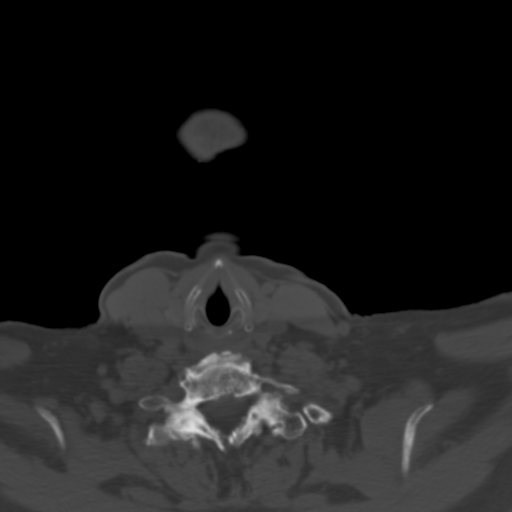
[im 14/79  bone]
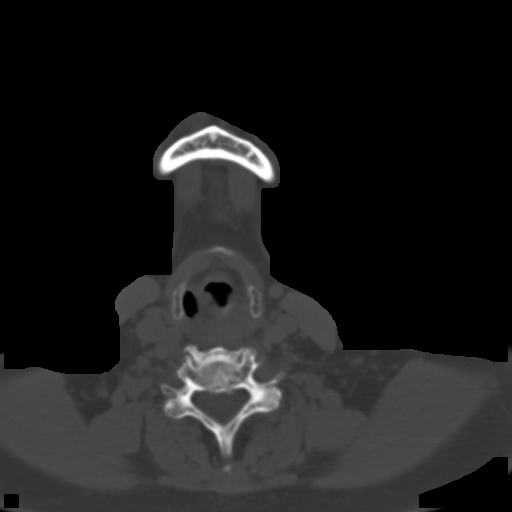
[im 22/79  bone]
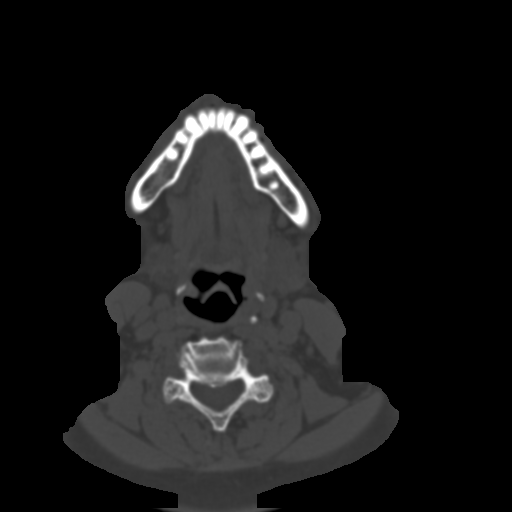
[im 30/79  bone]
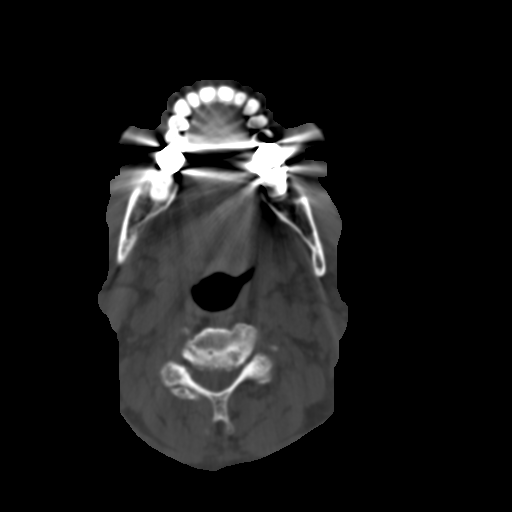
[im 41/79  brain]
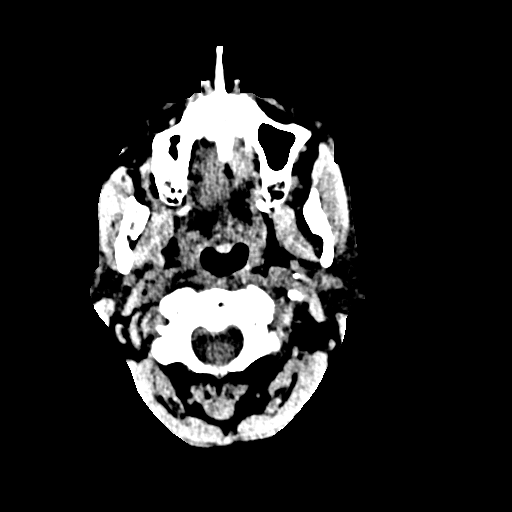
[im 41/79  bone]
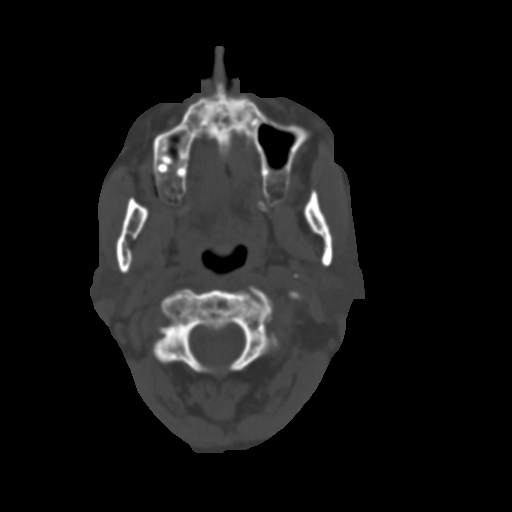
[im 49/79  bone]
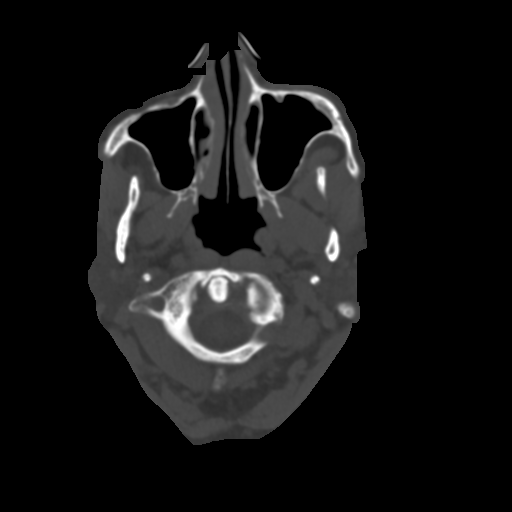
[im 57/79  bone]
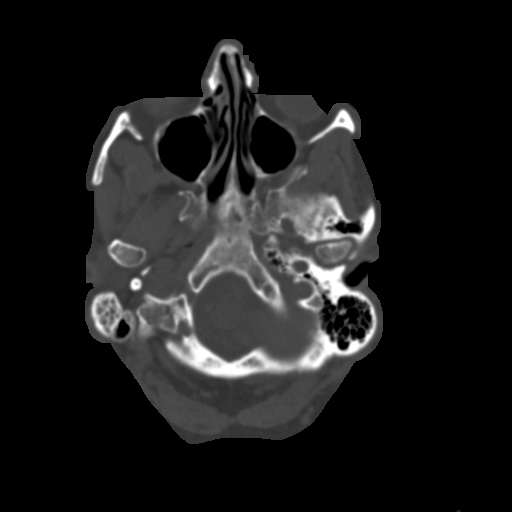
[im 65/79  bone]
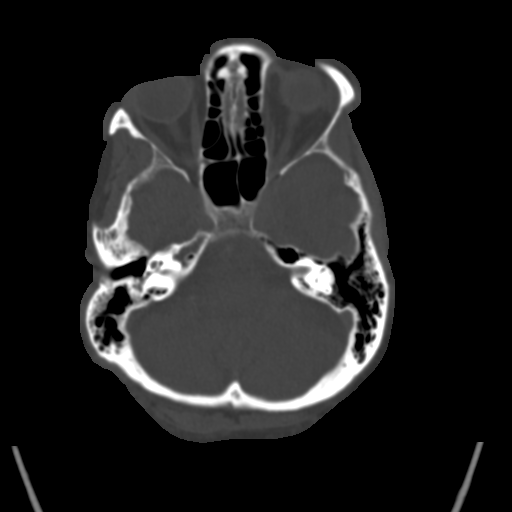
[im 73/79  brain]
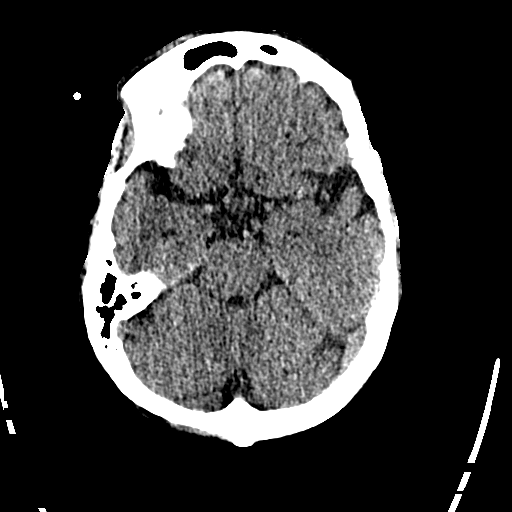
[im 73/79  bone]
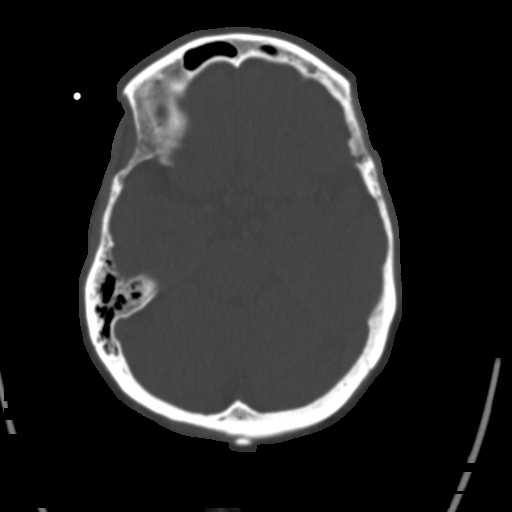

[Series 8: coronal bone · coronal · 0.31mm/px · 3 of 108 slices shown]
[im 27/108  bone]
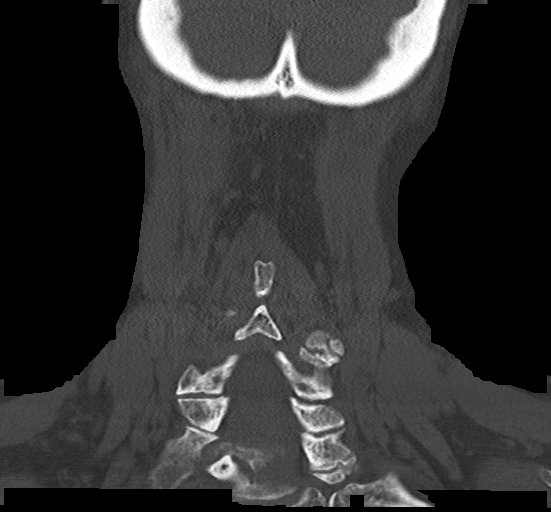
[im 54/108  bone]
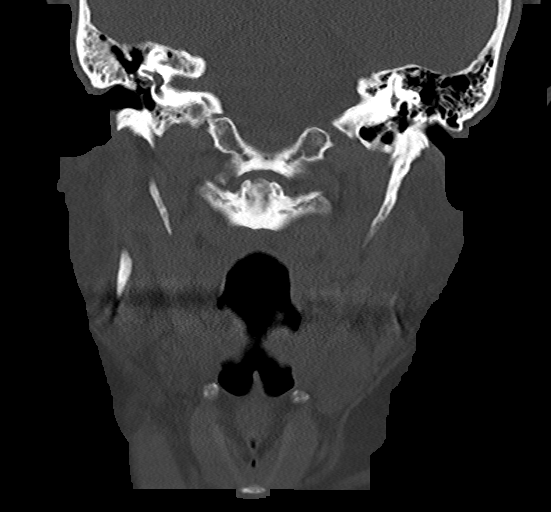
[im 81/108  bone]
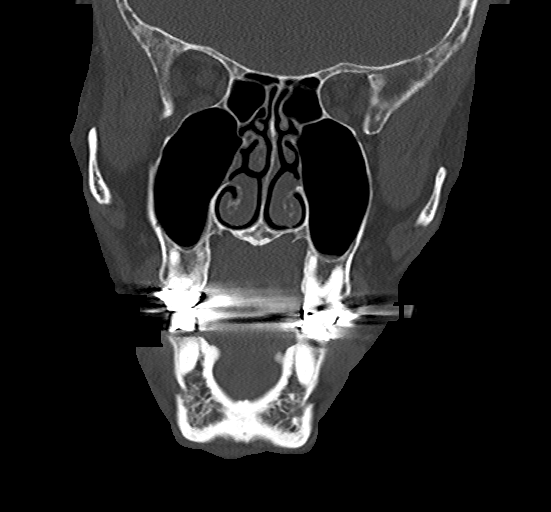

[Series 9: sagittal bone · sagittal · 0.31mm/px · 2 of 85 slices shown]
[im 29/85  bone]
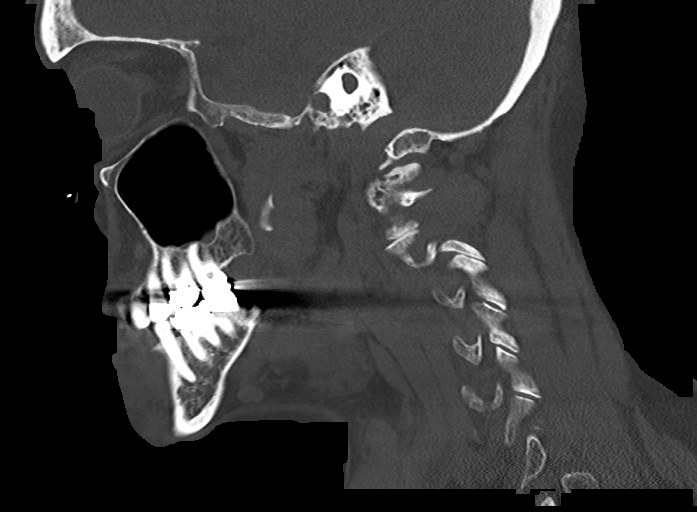
[im 57/85  bone]
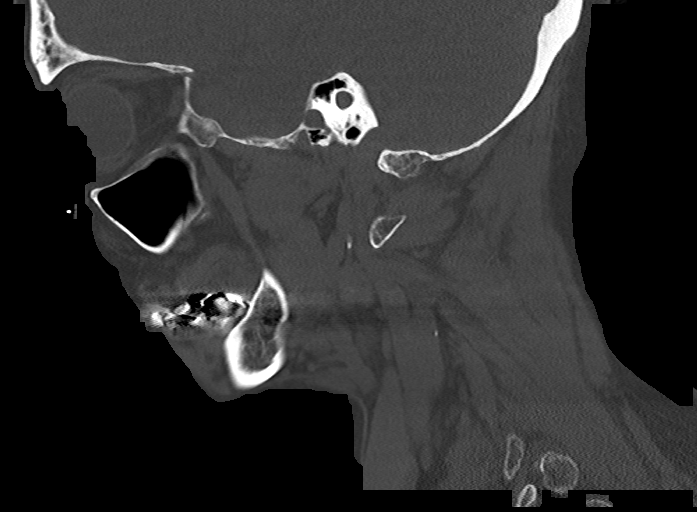

[14 of 47 positions shown; findings below may reference images not displayed]

FINDINGS: CT HEAD FINDINGS

Brain: No evidence of acute infarction, hemorrhage, hydrocephalus,
extra-axial collection or mass lesion/mass effect. Again seen is
mild diffuse atrophy and mild periventricular white matter
hypodensity, likely chronic small vessel ischemic change.

Vascular: Atherosclerotic calcifications are present within the
cavernous internal carotid arteries.

Skull: Normal. Negative for fracture or focal lesion.

Other: None.

CT MAXILLOFACIAL FINDINGS

Osseous: No fracture or mandibular dislocation. No destructive
process.

Orbits: There is right periorbital soft tissue swelling. Globes are
intact bilaterally. Postseptal orbital soft tissues are within
normal limits.

Sinuses: Partial right mastoid effusion appears unchanged. There is
mucosal thickening of the right sphenoid sinus. No air-fluid levels
are seen.

Soft tissues: There is soft tissue swelling overlying the right
anterior mandible. There is no foreign body.

CT CERVICAL SPINE FINDINGS

Alignment: There is straightening of normal cervical lordosis. There
is trace anterolisthesis at C2-C3.

Skull base and vertebrae: There is no acute fracture or dislocation.
No focal osseous lesions are identified.

Soft tissues and spinal canal: No prevertebral fluid or swelling. No
visible canal hematoma.

Disc levels: There is moderate disc space narrowing with endplate
osteophyte formation throughout the cervical spine compatible with
degenerative change. There also degenerative changes of mid and
upper facet joints bilaterally. There is mild central canal stenosis
at C3-C4 and C4-C5 secondary to posterior disc osteophyte complexes.
There are scattered neural foramina mild-to-moderate narrowing, left
greater than right, secondary to facet arthropathy.

Upper chest: There are nodular densities in the right lung apex
measuring up to 3 mm.

Other: None.
IMPRESSION: 1. No acute intracranial process.
2. No acute facial fracture.
3. No acute fracture or traumatic subluxation of the cervical spine.
4. Mild diffuse atrophy and mild chronic small vessel ischemic
change.
5. Stable right mastoid effusion.
6. Moderate multilevel degenerative changes of the cervical spine.
7. Multiple pulmonary nodules. Most severe: 3 mm right solid
pulmonary nodule within the upper lobe. If patient is low risk for
malignancy, no routine follow-up imaging is recommended; if patient
is high risk for malignancy, a non-contrast Chest CT at 12 months is
optional. If performed and the nodule is stable at 12 months, no
further follow-up is recommended.
These guidelines do not apply to immunocompromised patients and
patients with cancer. Follow up in patients with significant
comorbidities as clinically warranted. For lung cancer screening,
adhere to Lung-RADS guidelines. Reference: Radiology. 2504;

## 2022-03-03 IMAGING — CT CT HEAD W/O CM
4 series · 15 of 47 positions shown, 17 images · non-contrast
Comparison: CT head 03/08/2020.

CLINICAL DATA: Facial trauma.  Fall.

EXAM:
CT HEAD WITHOUT CONTRAST
CT MAXILLOFACIAL WITHOUT CONTRAST
CT CERVICAL SPINE WITHOUT CONTRAST
TECHNIQUE: Multidetector CT imaging of the head, cervical spine, and
maxillofacial structures were performed using the standard protocol
without intravenous contrast. Multiplanar CT image reconstructions
of the cervical spine and maxillofacial structures were also
generated.

[Series 2: head wo · axial · 0.43mm/px · z∈[-130,-10]mm · 7 of 33 slices shown, 9 images]
[im 5/33  brain]
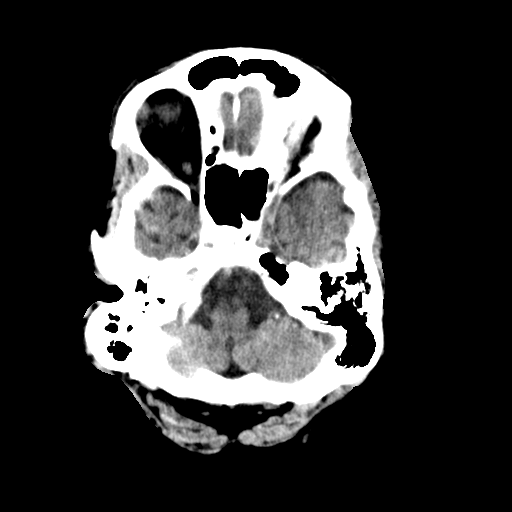
[im 5/33  bone]
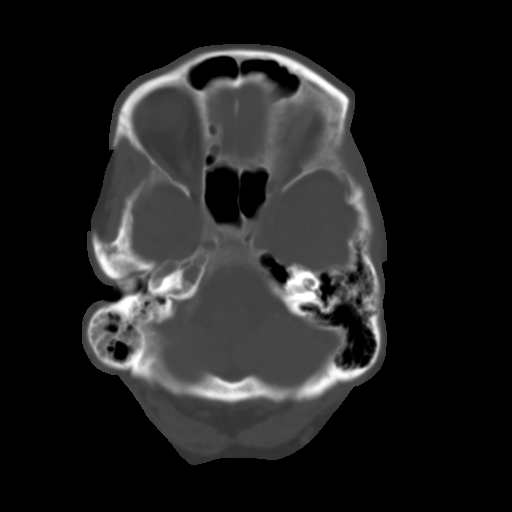
[im 9/33  brain]
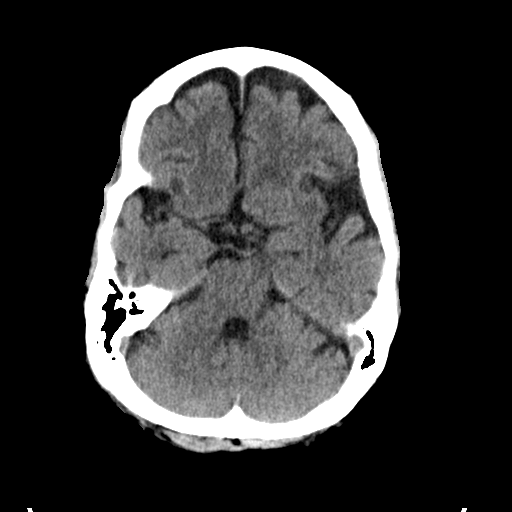
[im 13/33  brain]
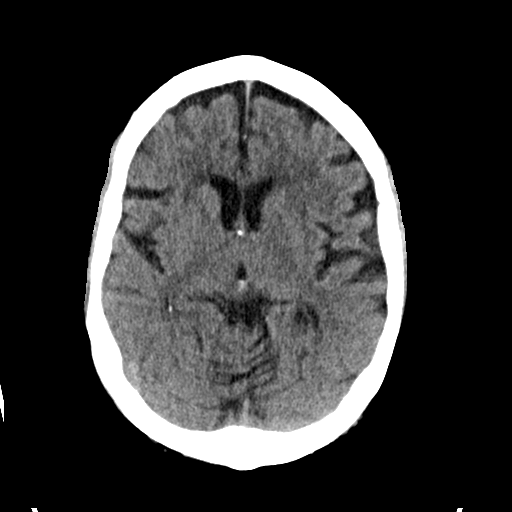
[im 17/33  brain]
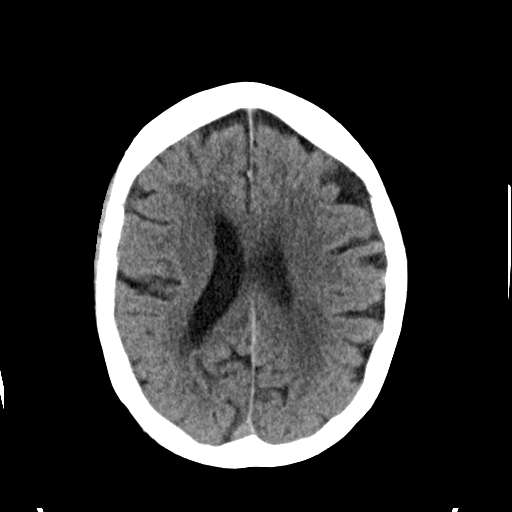
[im 21/33  brain]
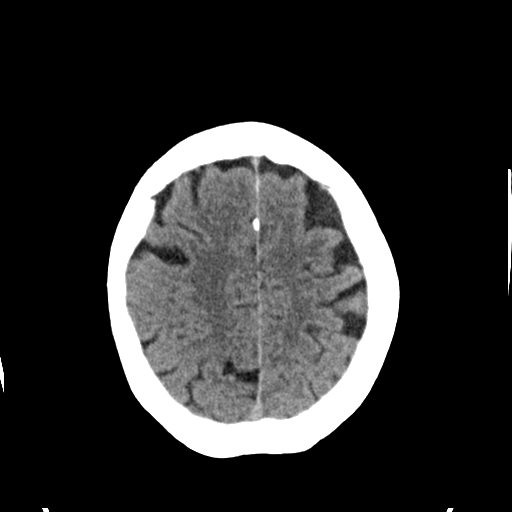
[im 21/33  bone]
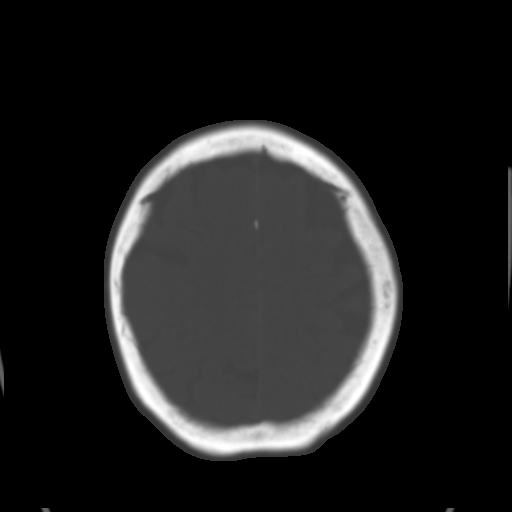
[im 25/33  brain]
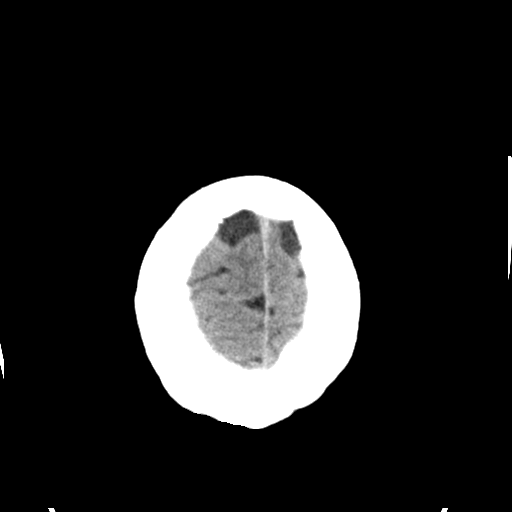
[im 29/33  brain]
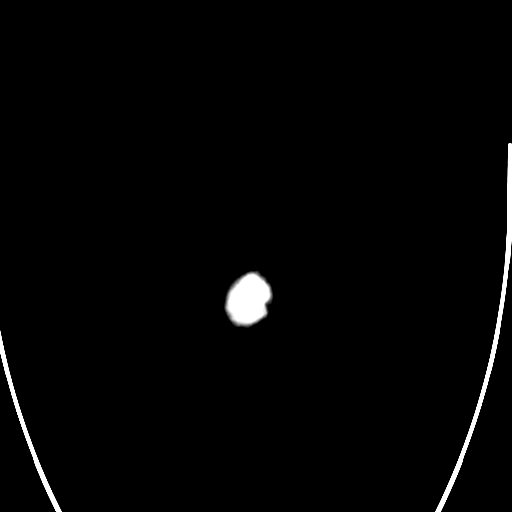

[Series 3: head bone · axial · 0.43mm/px · z∈[-134,-118]mm · 2 of 82 slices shown]
[im 9/82  bone]
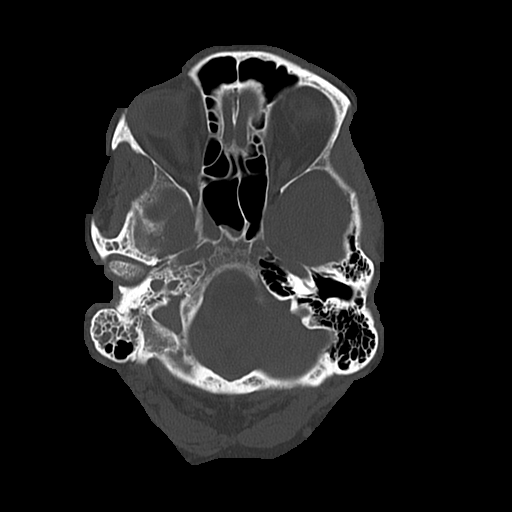
[im 17/82  bone]
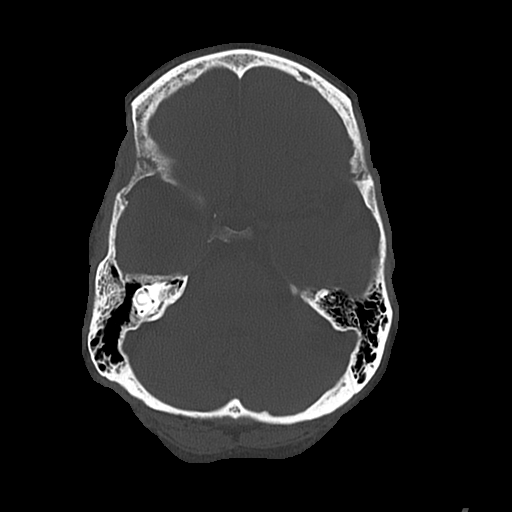

[Series 4: coronal soft · coronal · 0.30mm/px · 3 of 65 slices shown]
[im 22/65  brain]
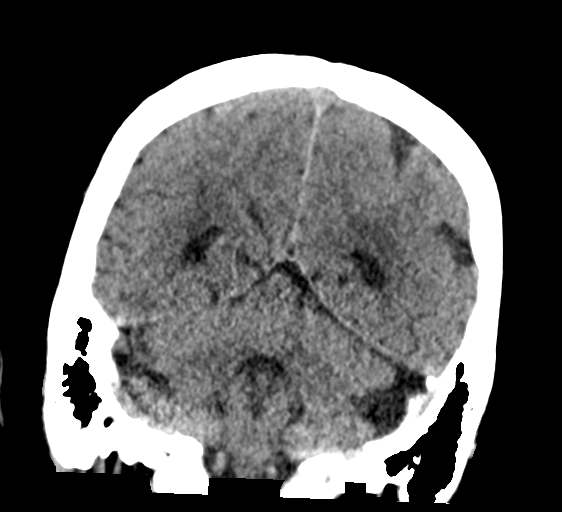
[im 29/65  brain]
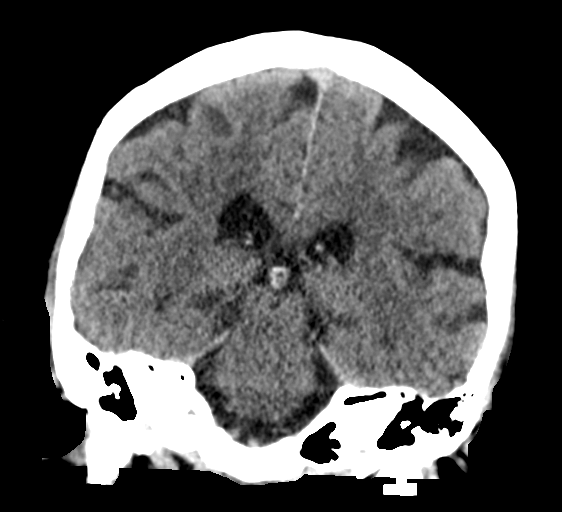
[im 36/65  brain]
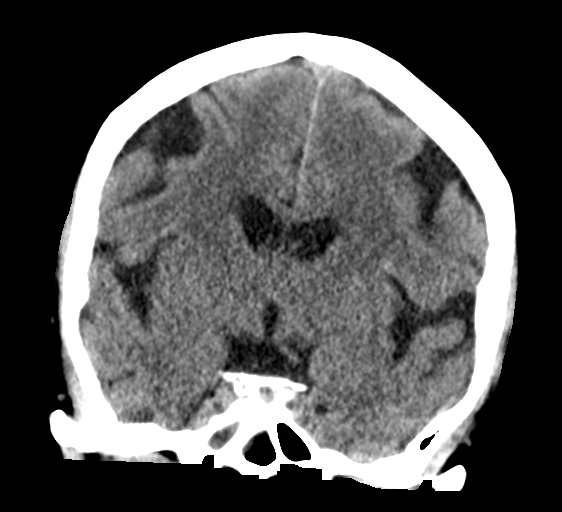

[Series 5: sagittal soft · sagittal · 0.33mm/px · 3 of 56 slices shown]
[im 19/56  brain]
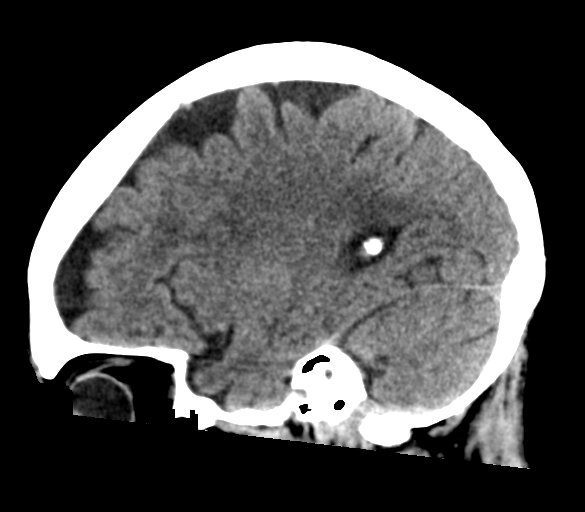
[im 28/56  brain]
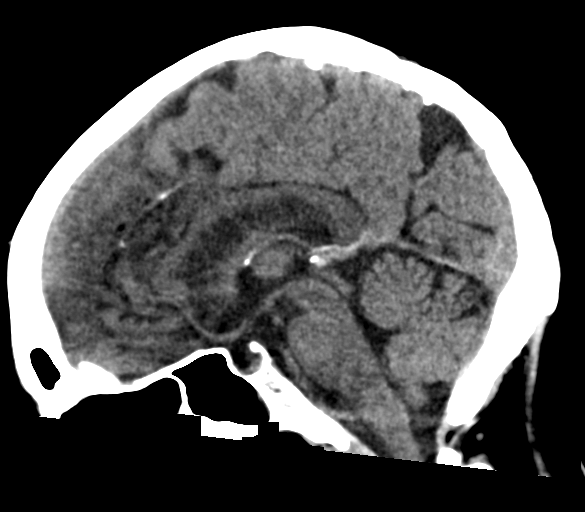
[im 37/56  brain]
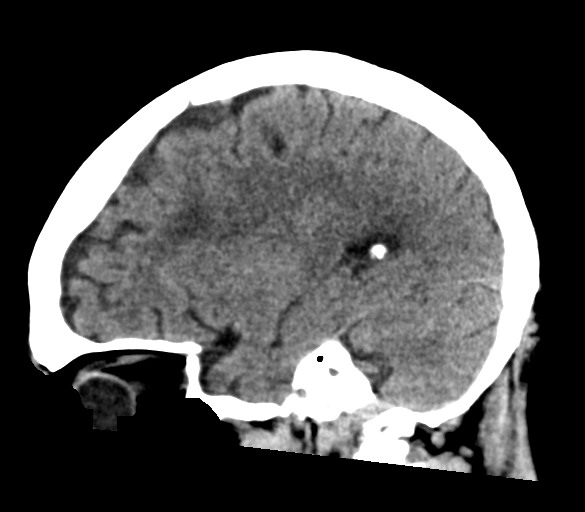

[15 of 47 positions shown; findings below may reference images not displayed]

FINDINGS: CT HEAD FINDINGS

Brain: No evidence of acute infarction, hemorrhage, hydrocephalus,
extra-axial collection or mass lesion/mass effect. Again seen is
mild diffuse atrophy and mild periventricular white matter
hypodensity, likely chronic small vessel ischemic change.

Vascular: Atherosclerotic calcifications are present within the
cavernous internal carotid arteries.

Skull: Normal. Negative for fracture or focal lesion.

Other: None.

CT MAXILLOFACIAL FINDINGS

Osseous: No fracture or mandibular dislocation. No destructive
process.

Orbits: There is right periorbital soft tissue swelling. Globes are
intact bilaterally. Postseptal orbital soft tissues are within
normal limits.

Sinuses: Partial right mastoid effusion appears unchanged. There is
mucosal thickening of the right sphenoid sinus. No air-fluid levels
are seen.

Soft tissues: There is soft tissue swelling overlying the right
anterior mandible. There is no foreign body.

CT CERVICAL SPINE FINDINGS

Alignment: There is straightening of normal cervical lordosis. There
is trace anterolisthesis at C2-C3.

Skull base and vertebrae: There is no acute fracture or dislocation.
No focal osseous lesions are identified.

Soft tissues and spinal canal: No prevertebral fluid or swelling. No
visible canal hematoma.

Disc levels: There is moderate disc space narrowing with endplate
osteophyte formation throughout the cervical spine compatible with
degenerative change. There also degenerative changes of mid and
upper facet joints bilaterally. There is mild central canal stenosis
at C3-C4 and C4-C5 secondary to posterior disc osteophyte complexes.
There are scattered neural foramina mild-to-moderate narrowing, left
greater than right, secondary to facet arthropathy.

Upper chest: There are nodular densities in the right lung apex
measuring up to 3 mm.

Other: None.
IMPRESSION: 1. No acute intracranial process.
2. No acute facial fracture.
3. No acute fracture or traumatic subluxation of the cervical spine.
4. Mild diffuse atrophy and mild chronic small vessel ischemic
change.
5. Stable right mastoid effusion.
6. Moderate multilevel degenerative changes of the cervical spine.
7. Multiple pulmonary nodules. Most severe: 3 mm right solid
pulmonary nodule within the upper lobe. If patient is low risk for
malignancy, no routine follow-up imaging is recommended; if patient
is high risk for malignancy, a non-contrast Chest CT at 12 months is
optional. If performed and the nodule is stable at 12 months, no
further follow-up is recommended.
These guidelines do not apply to immunocompromised patients and
patients with cancer. Follow up in patients with significant
comorbidities as clinically warranted. For lung cancer screening,
adhere to Lung-RADS guidelines. Reference: Radiology. 2504;

## 2022-03-03 IMAGING — CT CT CERVICAL SPINE W/O CM
3 of 4 series · 12 of 33 positions shown, 14 images · non-contrast
Comparison: CT head 03/08/2020.

CLINICAL DATA: Facial trauma.  Fall.

EXAM:
CT HEAD WITHOUT CONTRAST
CT MAXILLOFACIAL WITHOUT CONTRAST
CT CERVICAL SPINE WITHOUT CONTRAST
TECHNIQUE: Multidetector CT imaging of the head, cervical spine, and
maxillofacial structures were performed using the standard protocol
without intravenous contrast. Multiplanar CT image reconstructions
of the cervical spine and maxillofacial structures were also
generated.

[Series 5: cor bone · coronal · 0.41mm/px · 3 of 70 slices shown]
[im 14/70  bone]
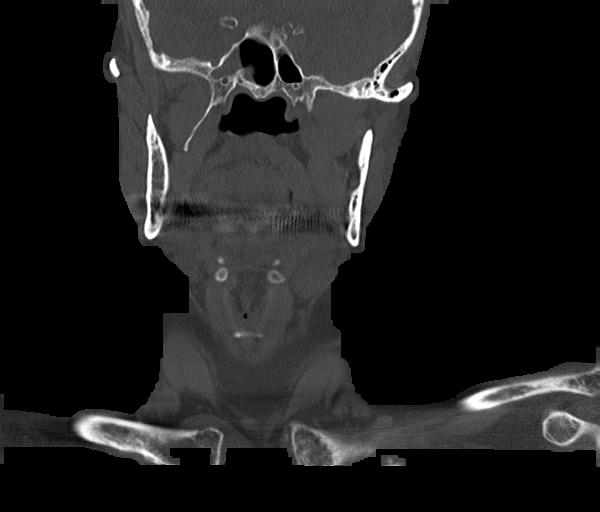
[im 28/70  bone]
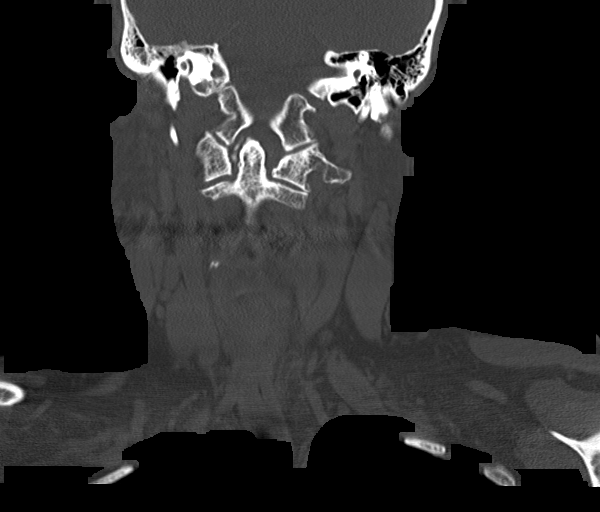
[im 42/70  bone]
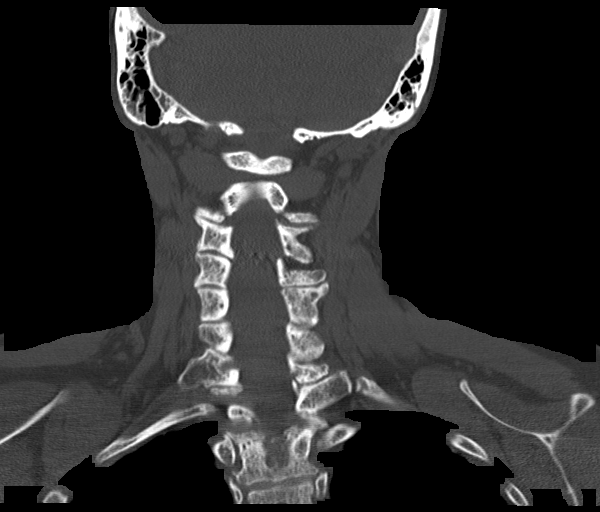

[Series 6: sag bone · sagittal · 0.29mm/px · 5 of 61 slices shown, 6 images]
[im 21/61  bone]
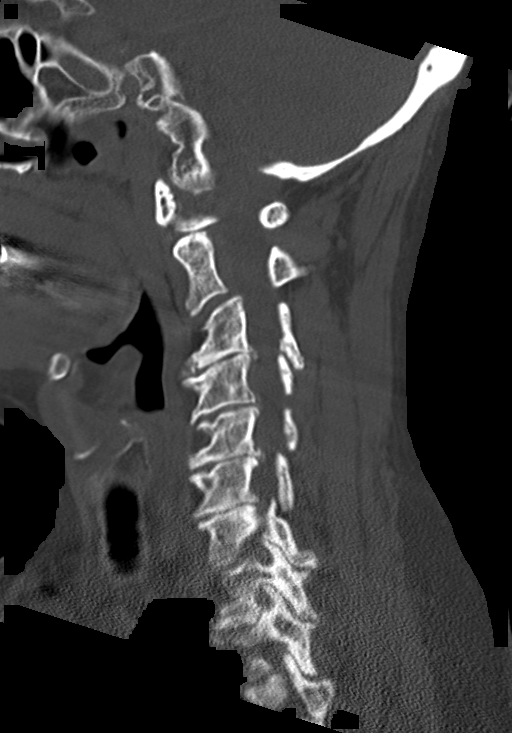
[im 26/61  bone]
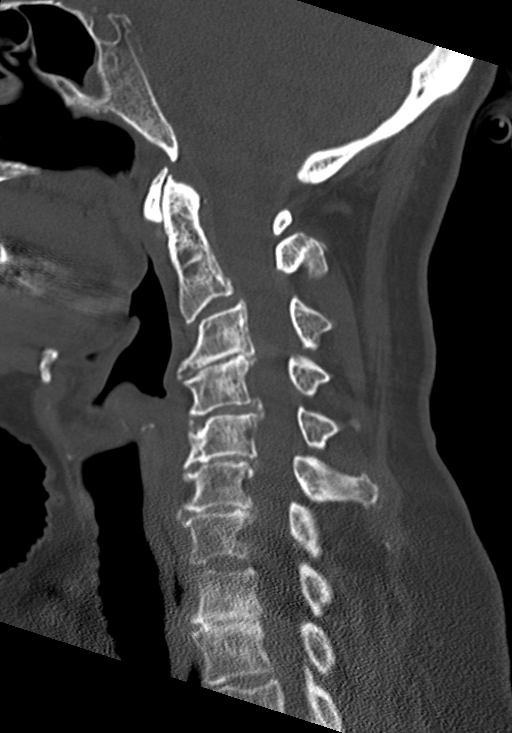
[im 31/61  soft-tissue]
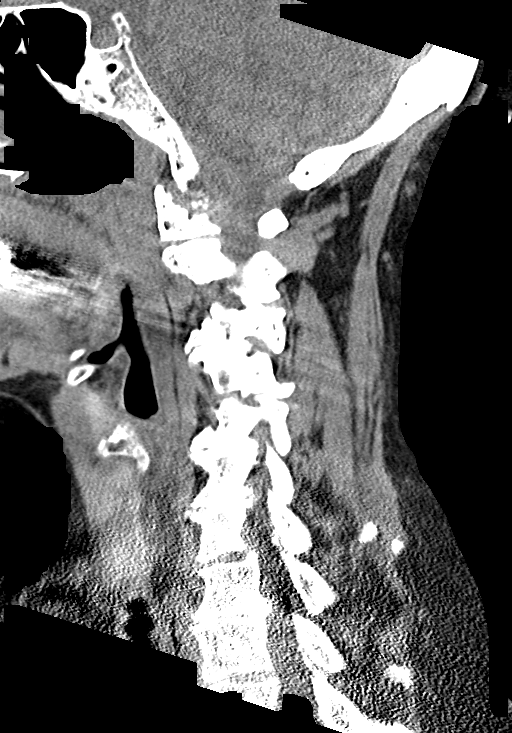
[im 31/61  bone]
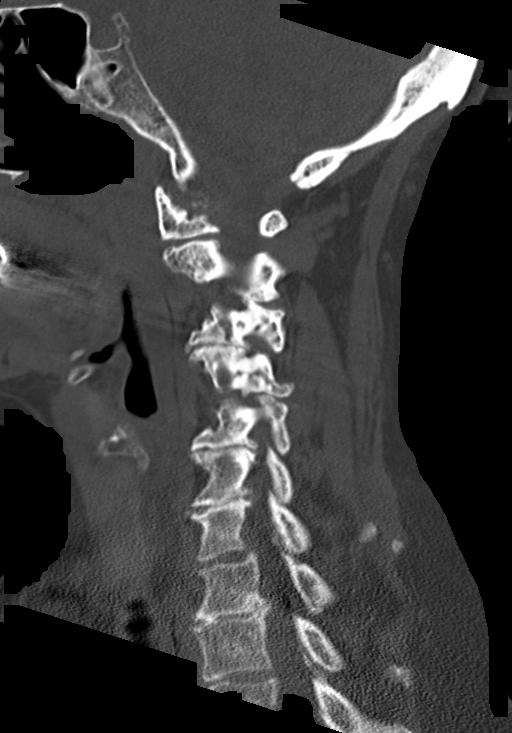
[im 36/61  bone]
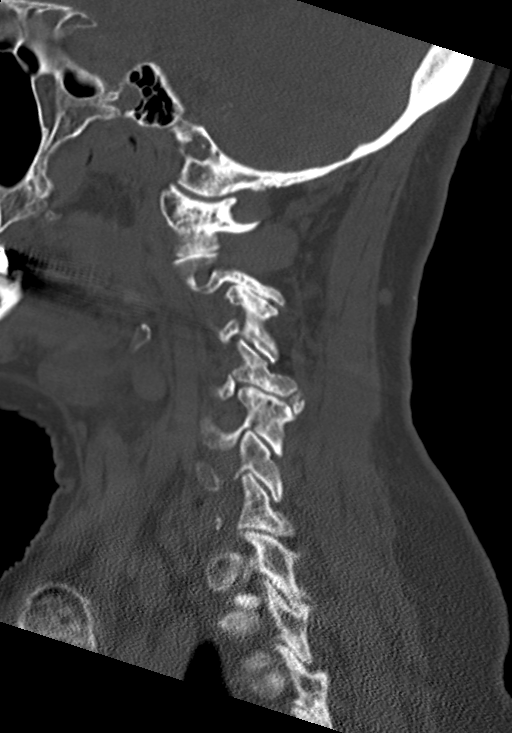
[im 41/61  bone]
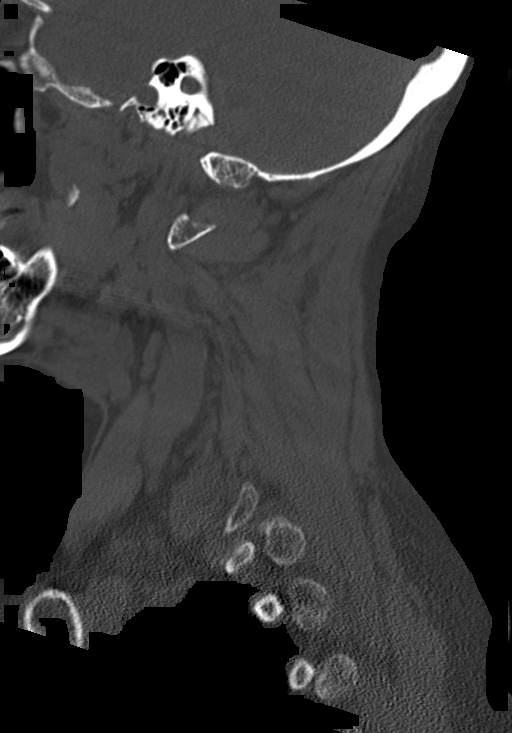

[Series 7: orthogonal axials · axial · 0.21mm/px · z∈[-297,-209]mm · 4 of 81 slices shown, 5 images]
[im 17/81  soft-tissue]
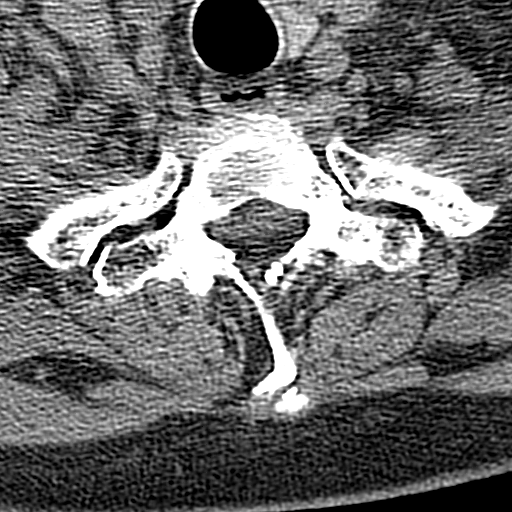
[im 17/81  bone]
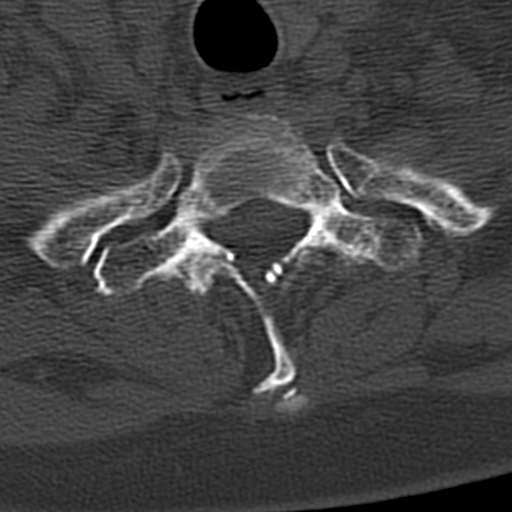
[im 33/81  bone]
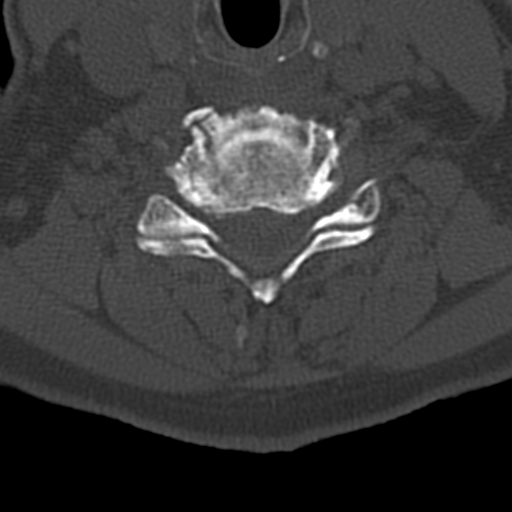
[im 49/81  bone]
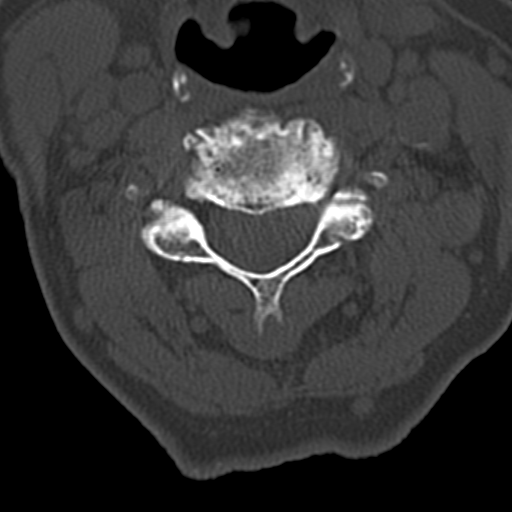
[im 65/81  bone]
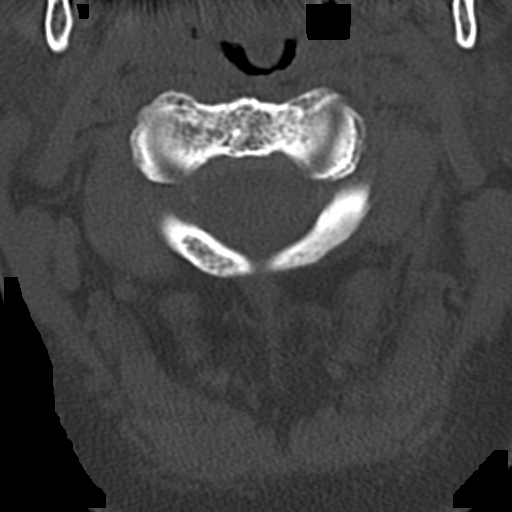

[12 of 33 positions shown; findings below may reference images not displayed]

FINDINGS: CT HEAD FINDINGS

Brain: No evidence of acute infarction, hemorrhage, hydrocephalus,
extra-axial collection or mass lesion/mass effect. Again seen is
mild diffuse atrophy and mild periventricular white matter
hypodensity, likely chronic small vessel ischemic change.

Vascular: Atherosclerotic calcifications are present within the
cavernous internal carotid arteries.

Skull: Normal. Negative for fracture or focal lesion.

Other: None.

CT MAXILLOFACIAL FINDINGS

Osseous: No fracture or mandibular dislocation. No destructive
process.

Orbits: There is right periorbital soft tissue swelling. Globes are
intact bilaterally. Postseptal orbital soft tissues are within
normal limits.

Sinuses: Partial right mastoid effusion appears unchanged. There is
mucosal thickening of the right sphenoid sinus. No air-fluid levels
are seen.

Soft tissues: There is soft tissue swelling overlying the right
anterior mandible. There is no foreign body.

CT CERVICAL SPINE FINDINGS

Alignment: There is straightening of normal cervical lordosis. There
is trace anterolisthesis at C2-C3.

Skull base and vertebrae: There is no acute fracture or dislocation.
No focal osseous lesions are identified.

Soft tissues and spinal canal: No prevertebral fluid or swelling. No
visible canal hematoma.

Disc levels: There is moderate disc space narrowing with endplate
osteophyte formation throughout the cervical spine compatible with
degenerative change. There also degenerative changes of mid and
upper facet joints bilaterally. There is mild central canal stenosis
at C3-C4 and C4-C5 secondary to posterior disc osteophyte complexes.
There are scattered neural foramina mild-to-moderate narrowing, left
greater than right, secondary to facet arthropathy.

Upper chest: There are nodular densities in the right lung apex
measuring up to 3 mm.

Other: None.
IMPRESSION: 1. No acute intracranial process.
2. No acute facial fracture.
3. No acute fracture or traumatic subluxation of the cervical spine.
4. Mild diffuse atrophy and mild chronic small vessel ischemic
change.
5. Stable right mastoid effusion.
6. Moderate multilevel degenerative changes of the cervical spine.
7. Multiple pulmonary nodules. Most severe: 3 mm right solid
pulmonary nodule within the upper lobe. If patient is low risk for
malignancy, no routine follow-up imaging is recommended; if patient
is high risk for malignancy, a non-contrast Chest CT at 12 months is
optional. If performed and the nodule is stable at 12 months, no
further follow-up is recommended.
These guidelines do not apply to immunocompromised patients and
patients with cancer. Follow up in patients with significant
comorbidities as clinically warranted. For lung cancer screening,
adhere to Lung-RADS guidelines. Reference: Radiology. 2504;

## 2022-03-05 ENCOUNTER — Encounter: Payer: Self-pay | Admitting: Adult Health

## 2022-03-05 ENCOUNTER — Other Ambulatory Visit: Payer: Medicare PPO

## 2022-03-05 ENCOUNTER — Ambulatory Visit: Payer: Medicare PPO | Admitting: Adult Health

## 2022-03-05 ENCOUNTER — Other Ambulatory Visit: Payer: Self-pay

## 2022-03-05 ENCOUNTER — Inpatient Hospital Stay: Payer: Medicare PPO | Attending: Adult Health

## 2022-03-05 ENCOUNTER — Inpatient Hospital Stay: Payer: Medicare PPO | Admitting: Adult Health

## 2022-03-05 VITALS — BP 123/78 | HR 88 | Temp 97.5°F | Resp 16 | Ht 64.0 in | Wt 138.5 lb

## 2022-03-05 DIAGNOSIS — C50311 Malignant neoplasm of lower-inner quadrant of right female breast: Secondary | ICD-10-CM | POA: Insufficient documentation

## 2022-03-05 DIAGNOSIS — Z800 Family history of malignant neoplasm of digestive organs: Secondary | ICD-10-CM | POA: Diagnosis not present

## 2022-03-05 DIAGNOSIS — M85851 Other specified disorders of bone density and structure, right thigh: Secondary | ICD-10-CM

## 2022-03-05 DIAGNOSIS — Z79811 Long term (current) use of aromatase inhibitors: Secondary | ICD-10-CM | POA: Insufficient documentation

## 2022-03-05 DIAGNOSIS — Z79899 Other long term (current) drug therapy: Secondary | ICD-10-CM | POA: Diagnosis not present

## 2022-03-05 DIAGNOSIS — C773 Secondary and unspecified malignant neoplasm of axilla and upper limb lymph nodes: Secondary | ICD-10-CM | POA: Diagnosis not present

## 2022-03-05 DIAGNOSIS — Z170 Estrogen receptor positive status [ER+]: Secondary | ICD-10-CM | POA: Insufficient documentation

## 2022-03-05 DIAGNOSIS — Z888 Allergy status to other drugs, medicaments and biological substances status: Secondary | ICD-10-CM | POA: Diagnosis not present

## 2022-03-05 DIAGNOSIS — I10 Essential (primary) hypertension: Secondary | ICD-10-CM | POA: Diagnosis not present

## 2022-03-05 LAB — CBC WITH DIFFERENTIAL (CANCER CENTER ONLY)
Abs Immature Granulocytes: 0.01 10*3/uL (ref 0.00–0.07)
Basophils Absolute: 0.1 10*3/uL (ref 0.0–0.1)
Basophils Relative: 1 %
Eosinophils Absolute: 0.5 10*3/uL (ref 0.0–0.5)
Eosinophils Relative: 10 %
HCT: 34.7 % — ABNORMAL LOW (ref 36.0–46.0)
Hemoglobin: 11.8 g/dL — ABNORMAL LOW (ref 12.0–15.0)
Immature Granulocytes: 0 %
Lymphocytes Relative: 28 %
Lymphs Abs: 1.5 10*3/uL (ref 0.7–4.0)
MCH: 32.1 pg (ref 26.0–34.0)
MCHC: 34 g/dL (ref 30.0–36.0)
MCV: 94.3 fL (ref 80.0–100.0)
Monocytes Absolute: 0.4 10*3/uL (ref 0.1–1.0)
Monocytes Relative: 7 %
Neutro Abs: 2.9 10*3/uL (ref 1.7–7.7)
Neutrophils Relative %: 54 %
Platelet Count: 274 10*3/uL (ref 150–400)
RBC: 3.68 MIL/uL — ABNORMAL LOW (ref 3.87–5.11)
RDW: 12.6 % (ref 11.5–15.5)
WBC Count: 5.3 10*3/uL (ref 4.0–10.5)
nRBC: 0 % (ref 0.0–0.2)

## 2022-03-05 LAB — CMP (CANCER CENTER ONLY)
ALT: 11 U/L (ref 0–44)
AST: 16 U/L (ref 15–41)
Albumin: 4.3 g/dL (ref 3.5–5.0)
Alkaline Phosphatase: 78 U/L (ref 38–126)
Anion gap: 5 (ref 5–15)
BUN: 14 mg/dL (ref 8–23)
CO2: 28 mmol/L (ref 22–32)
Calcium: 9.3 mg/dL (ref 8.9–10.3)
Chloride: 102 mmol/L (ref 98–111)
Creatinine: 0.95 mg/dL (ref 0.44–1.00)
GFR, Estimated: >60 mL/min (ref >60)
Glucose, Bld: 110 mg/dL — ABNORMAL HIGH (ref 70–99)
Potassium: 4 mmol/L (ref 3.5–5.1)
Sodium: 135 mmol/L (ref 135–145)
Total Bilirubin: 0.5 mg/dL (ref 0.3–1.2)
Total Protein: 7.1 g/dL (ref 6.5–8.1)

## 2022-03-05 NOTE — Progress Notes (Signed)
[facility] Follow up:    Destiny Stalker, PA-C [place] [address]   DIAGNOSIS:  Cancer Staging  Malignant neoplasm of lower-inner quadrant of right breast of female, estrogen receptor positive ([place]) Staging form: Breast, AJCC 8th Edition - Clinical stage from 10/16/2020: Stage IA (cT1b, cN0, cM0, G2, ER+, PR+, HER2-) - Signed by Truitt Merle, MD on 10/29/2020 Stage prefix: Initial diagnosis Histologic grading system: 3 grade system - Pathologic stage from 12/03/2020: Stage IA (pT1c, pN0(i+), cM0, G2, ER+, PR+, HER2-, Oncotype DX score: 23) - Signed by Truitt Merle, MD on 03/05/2021 Stage prefix: Initial diagnosis Multigene prognostic tests performed: Oncotype DX Recurrence score range: Greater than or equal to 11 Histologic grading system: 3 grade system   SUMMARY OF ONCOLOGIC HISTORY: Oncology History Overview Note   Cancer Staging  Malignant neoplasm of lower-inner quadrant of right breast of female, estrogen receptor positive ([place]) Staging form: Breast, AJCC 8th Edition - Clinical stage from 10/16/2020: Stage IA (cT1b, cN0, cM0, G2, ER+, PR+, HER2-) - Signed by Truitt Merle, MD on 10/29/2020 - Pathologic stage from 12/03/2020: Stage IA (pT1c, pN0(i+), cM0, G2, ER+, PR+, HER2-, Oncotype DX score: 23) - Signed by Truitt Merle, MD on 03/05/2021     Malignant neoplasm of lower-inner quadrant of right breast of female, estrogen receptor positive ([place])  10/08/2020 Mammogram   Right Diagnostic MM and Right Breast US  -Three irregular masses in lower-inner right breast:   -8 mm at 5-6 o'clock, 4 mm by [place]   -4 mm at 5 o'clock in anterior depth, 2 mm by [place]   -5 mm at 5 o'clock middle depth, 3 mm by [place] -Stable oval mass in right breast at 4 o'clock is benign -[place] of right axilla demonstrates mildly prominent right axillary lymph nodes but no definite lymphadenopathy.    10/16/2020 Cancer Staging   Staging form: Breast, AJCC 8th Edition - Clinical stage  from 10/16/2020: Stage IA (cT1b, cN0, cM0, G2, ER+, PR+, HER2-) - Signed by Truitt Merle, MD on 10/29/2020 Stage prefix: Initial diagnosis Histologic grading system: 3 grade system   10/16/2020 Pathology Results   Diagnosis 1. Breast, right, needle core biopsy, 6 o'clock mass - INVASIVE MAMMARY CARCINOMA. SEE NOTE - MAMMARY CARCINOMA IN SITU 2. Breast, right, needle core biopsy, 4 o'clock mass - INVASIVE MAMMARY CARCINOMA. SEE NOTE 3. Breast, right, needle core biopsy, 5 o'clock mass - INVASIVE MAMMARY CARCINOMA. SEE NOTE - MAMMARY CARCINOMA IN SITU Diagnosis Note 1. , 2 and 3. Carcinoma measures 0.5 cm in part 1, 0.4 cm in part 2 and 0.2 cm in part 3; and appears grade 2.  ADDENDUM: 1. ,2 and 3. Immunohistochemical stain for E-cadherin is negative in the tumor cells, consistent with a lobular phenotype.  1. PROGNOSTIC INDICATORS Results: The tumor cells are NEGATIVE for Her2 (1+). Estrogen Receptor: 100%, POSITIVE, STRONG STAINING INTENSITY Progesterone Receptor: 50%, POSITIVE, MODERATE STAINING INTENSITY Proliferation Marker Ki67: 5%   10/24/2020 Initial Diagnosis   Malignant neoplasm of lower-inner quadrant of right breast of female, estrogen receptor positive ([place])   12/03/2020 Cancer Staging   Staging form: Breast, AJCC 8th Edition - Pathologic stage from 12/03/2020: Stage IA (pT1c, pN0(i+), cM0, G2, ER+, PR+, HER2-, Oncotype DX score: 23) - Signed by Truitt Merle, MD on 03/05/2021 Stage prefix: Initial diagnosis Multigene prognostic tests performed: Oncotype DX Recurrence score range: Greater than or equal to 11 Histologic grading system: 3 grade system   12/03/2020 Definitive Surgery   FINAL MICROSCOPIC  DIAGNOSIS:   A. 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 BREAST, RIGHT MEDIAL MARGIN, EXCISION:  -  Focal residual invasive and in situ lobular carcinoma  -  Margins uninvolved by carcinoma (0.6 cm)   C. BREAST, RIGHT INFERIOR MARGIN, EXCISION:  -  Focal residual lobular carcinoma in situ  -  Margins uninvolved by carcinoma   D. BREAST, RIGHT LATERAL MARGIN, EXCISION:  -  No residual carcinoma identified   E. BREAST, RIGHT SUPERIOR MARGIN, EXCISION:  -  No residual carcinoma identified   F. BREAST, RIGHT POSTERIOR MARGIN, EXCISION:  -  No residual carcinoma identified   G. LYMPH NODE, RIGHT AXILLARY, SENTINEL, EXCISION:  -  No carcinoma identified in one lymph node (0/1)  -  See comment   H. LYMPH NODE, RIGHT AXILLARY, SENTINEL, EXCISION:  -  No carcinoma identified in one lymph node (0/1)  -  See comment   I. LYMPH NODE, RIGHT AXILLARY, SENTINEL, EXCISION:  -  No carcinoma identified in one lymph node (0/1)  -  See comment   J. LYMPH NODE, RIGHT AXILLARY, SENTINEL, EXCISION:  -  Isolated tumor cells present in one lymph node (0i/1)  -  See comment    12/03/2020 Oncotype testing   Oncotype DX was obtained on the final surgical sample and the recurrence score of 23 predicts a risk of recurrence outside the breast over the next 9 years of 9%, if the patient's only systemic therapy is an antiestrogen for 5 years.  It also predicts no benefit from chemotherapy.    01/29/2021 - 03/09/2021 Anti-estrogen oral therapy   01/29/2021 through 03/09/2021 Site Technique Total Dose (Gy) Dose per Fx (Gy) Completed Fx Beam Energies  Breast, Right: Breast_R 3D 50.4/50.4 1.8 28/28 6X    04/2021 -  Anti-estrogen oral therapy   Anastrozole daily     CURRENT THERAPY: Anastrozole  INTERVAL HISTORY: [name] 72 y.o. female returns for follow-up of her history of estrogen positive breast cancer.  She continues on anastrozole with good tolerance.  Has some vaginal dryness and for this she is using an intimate cleanser.   She is not sexually active.    Her most recent mammogram occurred on October 07, 2021 demonstrating no mammographic evidence of malignancy and breast density category B.   She is down 20 pounds in the past year.  She [place] house, built a new home, and has been going a lot which has helped lose some of the weight she gained during treatment.  Her normal weight is 140 pounds and today she is 138 pounds.  She exercises outside when she can, sees PCP regularly  Her most recent bone density testing occurred on November 05, 2020 showing mild osteopenia with a T-score of -1.2 in the right femoral neck.  Patient Active Problem List   Diagnosis Date Noted   Malignant neoplasm of lower-inner quadrant of right breast of female, estrogen receptor positive ([place]) 10/24/2020   Anxiety 10/28/2016   Allergic rhinitis 10/28/2016   HTN (hypertension) 10/28/2016   Constipation 10/28/2016   Insomnia 10/28/2016   Vitamin D deficiency 10/28/2016   Hyponatremia 10/26/2016   Primary osteoarthritis of right knee 08/28/2015    is allergic to  codeine and lisinopril.  MEDICAL HISTORY: Past Medical History:  Diagnosis Date   Anxiety    takes Xanax daily    Arthritis    Breast cancer ([place])    Depression    takes Pristiq and Wellbutrin daily   GERD (gastroesophageal reflux disease)    History of radiation therapy    right breast  01/29/2021-03/09/2021  Dr [name]   Joint pain    Joint swelling    Nocturia    Seasonal allergies    Takes Zyrtec nightly along with Nasal Spray   Vitamin D deficiency    new script for Vit D to start 08/22/15    SURGICAL HISTORY: Past Surgical History:  Procedure Laterality Date   blephorplasty Bilateral    BREAST LUMPECTOMY WITH RADIOACTIVE SEED AND SENTINEL LYMPH NODE BIOPSY Right 12/03/2020   Procedure: RIGHT BREAST LUMPECTOMY WITH RADIOACTIVE SEEDS X3 AND AXILLARY SENTINEL LYMPH NODE BIOPSY;  Surgeon: [name], MD;  Location: [facility];  Service: General;  Laterality: Right;   BREAST RECONSTRUCTION Right 12/09/2020   Procedure: RIGHT ONCOPLASTIC BREAST RECONSTRUCTION;  Surgeon: Irene Limbo, MD;  Location: [facility];  Service: Plastics;  Laterality: Right;   BREAST REDUCTION SURGERY Left 12/09/2020   Procedure: LEFT MAMMARY REDUCTION  (BREAST);  Surgeon: Irene Limbo, MD;  Location: [place];  Service: Plastics;  Laterality: Left;   COLONOSCOPY     left leg surgery     rod   TOTAL KNEE ARTHROPLASTY Right 09/01/2015   TOTAL KNEE ARTHROPLASTY Right 09/01/2015   Procedure: TOTAL KNEE ARTHROPLASTY;  Surgeon: Frederik Pear, MD;  Location: [facility];  Service: Orthopedics;  Laterality: Right;    SOCIAL HISTORY: Social History   Socioeconomic History   Marital status: Widowed    Spouse name: Not on file   Number of children: 0   Years of education: Not on file   Highest education level: Not on file  Occupational History   Not on file  Tobacco Use   Smoking status: Never   Smokeless tobacco: Never  Substance and Sexual Activity   Alcohol use: Yes    Alcohol/week: 2.0 standard drinks of alcohol    Types: 2 Glasses of wine per week    Comment: couple times a wk   Drug use: No   Sexual activity: Not Currently    Birth control/protection: Post-menopausal  Other Topics Concern   Not on file  Social History Narrative   Not on file   Social Determinants of Health   Financial Resource Strain: Not on file  Food Insecurity: Not on file  Transportation Needs: Not on file  Physical Activity: Not on file  Stress: Not on file  Social Connections: Not on file  Intimate Partner Violence: Not on file    FAMILY HISTORY: Family History  Problem Relation Age of Onset   Cancer Maternal Grandmother 77       colon cancer   Other Neg Hx        hyponatremia    Review of Systems  Constitutional:  Negative for appetite change, chills, fatigue, fever and unexpected weight change.   HENT:   Negative for hearing loss, lump/mass and trouble swallowing.   Eyes:  Negative for eye problems and icterus.  Respiratory:  Negative for chest tightness, cough and shortness of breath.   Cardiovascular:  Negative for chest pain, leg swelling and palpitations.  Gastrointestinal:  Negative for abdominal distention, abdominal pain, constipation, diarrhea, nausea and vomiting.  Endocrine:  Negative for hot flashes.  Genitourinary:  Negative for difficulty urinating.   Musculoskeletal:  Negative for arthralgias.  Skin:  Negative for itching and rash.  Neurological:  Negative for dizziness, extremity weakness, headaches and numbness.  Hematological:  Negative for adenopathy. Does not bruise/bleed easily.  Psychiatric/Behavioral:  Negative for depression. The patient is not nervous/anxious.       PHYSICAL EXAMINATION  ECOG PERFORMANCE STATUS: 1 - Symptomatic but completely ambulatory  Vitals:   03/05/22 1150  BP: 123/78  Pulse: 88  Resp: 16  Temp: (!) 97.5 F (36.4 C)  SpO2: 100%    Physical Exam Constitutional:      General: She is not in acute distress.    Appearance: Normal appearance. She is not toxic-appearing.  HENT:     Head: Normocephalic and atraumatic.  Eyes:     General: No scleral icterus. Cardiovascular:     Rate and Rhythm: Normal rate and regular rhythm.     Pulses: Normal pulses.     Heart sounds: Normal heart sounds.  Pulmonary:     Effort: Pulmonary effort is normal.     Breath sounds: Normal breath sounds.  Chest:     Comments: Right breast status postlumpectomy no sign of local recurrence left breast is benign. Abdominal:     General: Abdomen is flat. Bowel sounds are normal. There is no distension.     Palpations: Abdomen is soft.     Tenderness: There is no abdominal tenderness.  Musculoskeletal:        General: No swelling.     Cervical back: Neck supple.  Lymphadenopathy:     Cervical: No cervical adenopathy.  Skin:    General: Skin is  warm and dry.     Findings: No rash.  Neurological:     General: No focal deficit present.     Mental Status: She is alert.  Psychiatric:        Mood and Affect: Mood normal.        Behavior: Behavior normal.     LABORATORY DATA:  CBC    Component Value Date/Time   WBC 5.3 03/05/2022 1141   WBC 10.9 (H) 03/08/2020 1709   RBC 3.68 (L) 03/05/2022 1141   HGB 11.8 (L) 03/05/2022 1141   HCT 34.7 (L) 03/05/2022 1141   PLT 274 03/05/2022 1141   MCV 94.3 03/05/2022 1141   MCH 32.1 03/05/2022 1141   MCHC 34.0 03/05/2022 1141   RDW 12.6 03/05/2022 1141   LYMPHSABS 1.5 03/05/2022 1141   MONOABS 0.4 03/05/2022 1141   EOSABS 0.5 03/05/2022 1141   BASOSABS 0.1 03/05/2022 1141    CMP     Component Value Date/Time   NA 135 03/05/2022 1141   NA 139 09/05/2015 1332   K 4.0 03/05/2022 1141   CL 102 03/05/2022 1141   CO2 28 03/05/2022 1141   GLUCOSE 110 (H) 03/05/2022 1141   BUN 14 03/05/2022 1141   BUN 6 09/05/2015 1332   CREATININE 0.95 03/05/2022 1141   CREATININE 0.91 03/06/2021 1515   CALCIUM 9.3 03/05/2022 1141   PROT 7.1 03/05/2022 1141   ALBUMIN 4.3 03/05/2022 1141   AST 16 03/05/2022 1141   ALT 11 03/05/2022 1141   ALKPHOS 78 03/05/2022 1141   BILITOT 0.5 03/05/2022 1141   GFRNONAA >60 03/05/2022 1141   GFRAA >60 09/02/2015 0510      ASSESSMENT and THERAPY PLAN:   Malignant neoplasm of lower-inner quadrant of right breast of female, estrogen receptor positive ([place]) [name]  is a 72 year old woman with history of stage Ia right breast ER/PR positive breast cancer diagnosed in October 2022 status post right breast lumpectomy followed by adjuvant radiation and anastrozole daily which began in April 2023.  [name] has no clinical or radiographic signs of breast cancer recurrence.  She will continue on anastrozole daily as she is tolerating it well.  We did discuss her vaginal dryness and I gave her some recommendations of using vitamin E suppositories and a pH balanced  intimate cleanser.  She is due for repeat mammogram in October 2024 which I placed orders for.  She also has osteopenia with a T-score -1.2 in the right femur therefore she will undergo repeat bone density testing in November 2024.  I had my nurse Tammy fax these orders over to [facility] so this can be scheduled for the patient.  [name] will return in 6 months for labs and follow-up.  She knows to call in the meantime if she develops any questions or concerns.   All questions were answered. The patient knows to call the clinic with any problems, questions or concerns. We can certainly see the patient much sooner if necessary.  Total encounter time:20 minutes*in face-to-face visit time, chart review, lab review, care coordination, order entry, and documentation of the encounter time.    Wilber Bihari, NP 03/05/22 3:11 PM Medical Oncology and Hematology [facility] [place], [place] [address] Tel. [phone]    Fax. [phone]  *Total Encounter Time as defined by the Centers for Medicare and Medicaid Services includes, in addition to the face-to-face time of a patient visit (documented in the note above) non-face-to-face time: obtaining and reviewing outside history, ordering and reviewing medications, tests or procedures, care coordination (communications with other health care professionals or caregivers) and documentation in the medical record.

## 2022-03-05 NOTE — Assessment & Plan Note (Signed)
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  [name] has no clinical or radiographic signs of breast cancer recurrence.  She will continue on anastrozole daily as she is tolerating it well.  We did discuss her vaginal dryness and I gave her some recommendations of using vitamin E suppositories and a pH balanced intimate cleanser.  She is due for repeat mammogram in October 2024 which I placed orders for.  She also has osteopenia with a T-score -1.2 in the right femur therefore she will undergo repeat bone density testing in November 2024.  I had my nurse Tammy fax these orders over to [facility] so this can be scheduled for the patient.  [name] will return in 6 months for labs and follow-up.  She knows to call in the meantime if she develops any questions or concerns.

## 2022-03-08 DIAGNOSIS — E871 Hypo-osmolality and hyponatremia: Secondary | ICD-10-CM | POA: Diagnosis not present

## 2022-03-09 ENCOUNTER — Encounter: Payer: Self-pay | Admitting: Hematology

## 2022-03-15 DIAGNOSIS — E871 Hypo-osmolality and hyponatremia: Secondary | ICD-10-CM | POA: Diagnosis not present

## 2022-03-15 DIAGNOSIS — R918 Other nonspecific abnormal finding of lung field: Secondary | ICD-10-CM | POA: Diagnosis not present

## 2022-03-15 DIAGNOSIS — Z853 Personal history of malignant neoplasm of breast: Secondary | ICD-10-CM | POA: Diagnosis not present

## 2022-03-23 ENCOUNTER — Other Ambulatory Visit: Payer: Self-pay | Admitting: Internal Medicine

## 2022-03-25 DIAGNOSIS — E871 Hypo-osmolality and hyponatremia: Secondary | ICD-10-CM | POA: Diagnosis not present

## 2022-03-29 ENCOUNTER — Ambulatory Visit: Payer: Medicare PPO

## 2022-04-21 DIAGNOSIS — Z853 Personal history of malignant neoplasm of breast: Secondary | ICD-10-CM | POA: Diagnosis not present

## 2022-04-21 DIAGNOSIS — R1032 Left lower quadrant pain: Secondary | ICD-10-CM | POA: Diagnosis not present

## 2022-04-21 DIAGNOSIS — Z124 Encounter for screening for malignant neoplasm of cervix: Secondary | ICD-10-CM | POA: Diagnosis not present

## 2022-04-21 DIAGNOSIS — M8580 Other specified disorders of bone density and structure, unspecified site: Secondary | ICD-10-CM | POA: Diagnosis not present

## 2022-04-21 DIAGNOSIS — Z8742 Personal history of other diseases of the female genital tract: Secondary | ICD-10-CM | POA: Diagnosis not present

## 2022-04-21 DIAGNOSIS — Z01419 Encounter for gynecological examination (general) (routine) without abnormal findings: Secondary | ICD-10-CM | POA: Diagnosis not present

## 2022-04-21 DIAGNOSIS — N898 Other specified noninflammatory disorders of vagina: Secondary | ICD-10-CM | POA: Diagnosis not present

## 2022-04-21 DIAGNOSIS — Z1151 Encounter for screening for human papillomavirus (HPV): Secondary | ICD-10-CM | POA: Diagnosis not present

## 2022-04-26 ENCOUNTER — Ambulatory Visit: Payer: Medicare PPO | Attending: General Surgery

## 2022-04-26 VITALS — Wt 135.2 lb

## 2022-04-26 DIAGNOSIS — Z483 Aftercare following surgery for neoplasm: Secondary | ICD-10-CM

## 2022-04-26 NOTE — Therapy (Signed)
OUTPATIENT PHYSICAL THERAPY SOZO SCREENING NOTE   Patient Name: [name] MRN: [id number] DOB:Feb 11, 1950, 72 y.o., female Today's Date: 04/26/2022  PCP: [name], PA-C REFERRING PROVIDER: [name], MD   PT End of Session - 04/26/22 1040     Visit Number 2   # unchanged due to screen only   PT Start Time 1038    PT Stop Time 1042    PT Time Calculation (min) 4 min    Activity Tolerance Patient tolerated treatment well    Behavior During Therapy [facility] for tasks assessed/performed             Past Medical History:  Diagnosis Date   Anxiety    takes Xanax daily    Arthritis    Breast cancer (HCC)    Depression    takes Pristiq and Wellbutrin daily   GERD (gastroesophageal reflux disease)    History of radiation therapy    right breast  01/29/2021-03/09/2021  Dr [name]   Joint pain    Joint swelling    Nocturia    Seasonal allergies    Takes Zyrtec nightly along with Nasal Spray   Vitamin D deficiency    new script for Vit D to start 08/22/15   Past Surgical History:  Procedure Laterality Date   blephorplasty Bilateral    BREAST LUMPECTOMY WITH RADIOACTIVE SEED AND SENTINEL LYMPH NODE BIOPSY Right 12/03/2020   Procedure: RIGHT BREAST LUMPECTOMY WITH RADIOACTIVE SEEDS X3 AND AXILLARY SENTINEL LYMPH NODE BIOPSY;  Surgeon: [name], MD;  Location: [facility];  Service: General;  Laterality: Right;   BREAST RECONSTRUCTION Right 12/09/2020   Procedure: RIGHT ONCOPLASTIC BREAST RECONSTRUCTION;  Surgeon: Glenna Fellows, MD;  Location: [facility];  Service: Plastics;  Laterality: Right;   BREAST REDUCTION SURGERY Left 12/09/2020   Procedure: LEFT MAMMARY REDUCTION  (BREAST);  Surgeon: Glenna Fellows, MD;  Location: [facility];  Service: Plastics;  Laterality: Left;   COLONOSCOPY     left leg surgery     rod   TOTAL KNEE ARTHROPLASTY Right 09/01/2015   TOTAL KNEE ARTHROPLASTY Right 09/01/2015    Procedure: TOTAL KNEE ARTHROPLASTY;  Surgeon: Gean Birchwood, MD;  [facility];  Service: Orthopedics;  Laterality: Right;   Patient Active Problem List   Diagnosis Date Noted   Malignant neoplasm of lower-inner quadrant of right breast of female, estrogen receptor positive 10/24/2020   Anxiety 10/28/2016   Allergic rhinitis 10/28/2016   HTN (hypertension) 10/28/2016   Constipation 10/28/2016   Insomnia 10/28/2016   Vitamin D deficiency 10/28/2016   Hyponatremia 10/26/2016   Primary osteoarthritis of right knee 08/28/2015    REFERRING DIAG: right breast cancer at risk for lymphedema  THERAPY DIAG: Aftercare following surgery for neoplasm  PERTINENT HISTORY: Patient was diagnosed on 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 She underwent a right lumpectomy and sentinel node biopsy (4 nodes all negative except one with isolated tumor cells) on 12/03/2020. She then underwent a bilateral breast reduction on 12/09/2020.. It is ER/PR positive and HER2 negative with a Ki67 of 5%.   PRECAUTIONS: right UE Lymphedema risk, None  SUBJECTIVE: Pt returns for her 3 month L-Dex screen.   PAIN:  Are you having pain? No  SOZO SCREENING: Patient was assessed today using the SOZO machine to determine the lymphedema index score. This was compared to her baseline score. It was determined that she is within the recommended range when compared to her baseline and no further  action is needed at this time. She will continue SOZO screenings. These are done every 3 months for 2 years post operatively followed by every 6 months for 2 years, and then annually.   L-DEX FLOWSHEETS - 04/26/22 1000       L-DEX LYMPHEDEMA SCREENING   Measurement Type Unilateral    L-DEX MEASUREMENT EXTREMITY Upper Extremity    POSITION  Standing    DOMINANT SIDE Right    At Risk Side Right    BASELINE SCORE (UNILATERAL) 0.7    L-DEX SCORE (UNILATERAL) -8.2    VALUE CHANGE (UNILAT) -8.9               Hermenia Bers, PTA 04/26/2022, 10:42 AM

## 2022-05-06 ENCOUNTER — Other Ambulatory Visit: Payer: Self-pay | Admitting: *Deleted

## 2022-05-06 DIAGNOSIS — E782 Mixed hyperlipidemia: Secondary | ICD-10-CM

## 2022-05-18 DIAGNOSIS — R1032 Left lower quadrant pain: Secondary | ICD-10-CM | POA: Diagnosis not present

## 2022-05-20 DIAGNOSIS — M79671 Pain in right foot: Secondary | ICD-10-CM | POA: Diagnosis not present

## 2022-05-27 DIAGNOSIS — M79671 Pain in right foot: Secondary | ICD-10-CM | POA: Diagnosis not present

## 2022-06-03 DIAGNOSIS — M79671 Pain in right foot: Secondary | ICD-10-CM | POA: Diagnosis not present

## 2022-06-14 DIAGNOSIS — E871 Hypo-osmolality and hyponatremia: Secondary | ICD-10-CM | POA: Diagnosis not present

## 2022-06-22 DIAGNOSIS — Z853 Personal history of malignant neoplasm of breast: Secondary | ICD-10-CM | POA: Diagnosis not present

## 2022-06-22 DIAGNOSIS — E871 Hypo-osmolality and hyponatremia: Secondary | ICD-10-CM | POA: Diagnosis not present

## 2022-07-15 DIAGNOSIS — M79671 Pain in right foot: Secondary | ICD-10-CM | POA: Diagnosis not present

## 2022-07-16 ENCOUNTER — Telehealth: Payer: Self-pay

## 2022-07-16 ENCOUNTER — Other Ambulatory Visit: Payer: Self-pay

## 2022-07-16 NOTE — Telephone Encounter (Signed)
Please check my schedule to see if we can get patient in sooner.  She could also see [facility], or if a verified breast concern, the PA or her surgeon at [facility].    Lillard Anes, NP 07/16/22 3:31 PM Medical Oncology and Hematology [facility] [address] [place], [place] [address] Tel. [phone]    Fax. [phone]

## 2022-07-16 NOTE — Telephone Encounter (Signed)
Can anyone see the patient sooner than 7/23 for her breast concern?  Thanks, Mardella Layman

## 2022-07-16 NOTE — Telephone Encounter (Signed)
Pt called c/o pain in her Rt breast x 2wks.  Pt stated the pain started in the axilla but now its her complete right breast.  Pt stated the pain is 4to5 on a 0/10 numeric pain scale.  Pt stated the pain is a constant burning dull pain.  Pt stated the pain does not radiate anywhere.  Pt denied edema in the right arm, redness nor hotness in the breast.  Pt stated she sleeps on her right side and thought the pain may have originated from that; therefore, the pt started sleeping on her back but the pain in the right breast did not resolve.  Pt is taking Acetaminophen 500mg  PO BID but its not resolving her pain.  Pt is currently scheduled to see Lillard Anes, DNP in September 2024.  Pt would like to be seen earlier if possible.  Pt is currently doing PT at [facility] and is scheduled for her next PT appt on 07/22.  Informed pt that this nurse will notify the providers and see if the pt could be seen sooner.  Pt verbalized understanding and had no further questions or concerns.

## 2022-07-19 ENCOUNTER — Encounter: Payer: Self-pay | Admitting: Hematology

## 2022-07-19 ENCOUNTER — Inpatient Hospital Stay: Payer: Medicare PPO | Attending: Hematology | Admitting: Hematology

## 2022-07-19 ENCOUNTER — Telehealth: Payer: Self-pay | Admitting: Hematology

## 2022-07-19 VITALS — BP 146/87 | HR 107 | Temp 98.1°F | Resp 17 | Wt 137.7 lb

## 2022-07-19 DIAGNOSIS — C50311 Malignant neoplasm of lower-inner quadrant of right female breast: Secondary | ICD-10-CM

## 2022-07-19 DIAGNOSIS — Z79811 Long term (current) use of aromatase inhibitors: Secondary | ICD-10-CM | POA: Insufficient documentation

## 2022-07-19 DIAGNOSIS — Z79899 Other long term (current) drug therapy: Secondary | ICD-10-CM | POA: Insufficient documentation

## 2022-07-19 DIAGNOSIS — Z923 Personal history of irradiation: Secondary | ICD-10-CM | POA: Insufficient documentation

## 2022-07-19 DIAGNOSIS — M85851 Other specified disorders of bone density and structure, right thigh: Secondary | ICD-10-CM | POA: Insufficient documentation

## 2022-07-19 DIAGNOSIS — Z170 Estrogen receptor positive status [ER+]: Secondary | ICD-10-CM | POA: Diagnosis not present

## 2022-07-19 NOTE — Progress Notes (Signed)
[facility]   Telephone:(336) [phone] Fax:(336) [phone]   Clinic Follow up Note   Patient Care Team: [name], PA-C as PCP - General (Family Medicine) [name], MD as Consulting Physician (Hematology) [name], MD as Consulting Physician (General Surgery) [name], MD as Consulting Physician (Radiation Oncology)  Date of Service:  07/19/2022  CHIEF COMPLAINT: f/u of right breast cancer  CURRENT THERAPY:  Anastrozole   ASSESSMENT:  [name] is a 72 y.o. female with     1. 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 S/p right lumpectomy on 12/03/20 under Dr. Dwain Sarna revealed multifocal invasive lobular carcinoma, 0.9 cm and 0.5 cm. -Oncotype RS of 23, low risk -s/p radiation 01/29/21 - 03/09/21 under Dr. Roselind Messier -she started anastrozole in late 03/2021. Plan for 10 years due to lobular histology. She is tolerating well with no noticeable side effects. -she came in today due to recent right breast pain.  Exam was unremarkable, I reassured her her breast pain is likely related to previous surgery and radiation, no concern for breast cancer recurrence. I do not feel she needs imaging test at this point  -Her next screening mammogram will be in October 2024   2. Bone Health  -repeat DEXA on 11/05/20 at [facility] showed osteopenia (T-score of -1.2 at right hip) -will monitor every 2 years on AI  3. Right foot stress fracture  -She wears a special boot, will follow-up with her orthopedic surgeon.   PLAN: -lab and f/u with NP Mardella Layman 9/15    SUMMARY OF ONCOLOGIC HISTORY: Oncology History Overview Note   Cancer Staging  Malignant neoplasm of lower-inner quadrant of right breast of female, estrogen receptor positive (HCC) Staging form: Breast, AJCC 8th Edition - Clinical stage from 10/16/2020: Stage IA (cT1b, cN0, cM0, G2, ER+,  PR+, HER2-) - Signed by [name], MD on 10/29/2020 - Pathologic stage from 12/03/2020: Stage IA (pT1c, pN0(i+), cM0, G2, ER+, PR+, HER2-, Oncotype DX score: 23) - Signed by [name], MD on 03/05/2021     Malignant neoplasm of lower-inner quadrant of right breast of female, estrogen receptor positive (HCC)  10/08/2020 Mammogram   Right Diagnostic MM and Right Breast US  -Three irregular masses in lower-inner right breast:   -8 mm at 5-6 o'clock, 4 mm by [place]   -4 mm at 5 o'clock in anterior depth, 2 mm by [place]   -5 mm at 5 o'clock middle depth, 3 mm by [place] -Stable oval mass in right breast at 4 o'clock is benign -[place] of right axilla demonstrates mildly prominent right axillary lymph nodes but no definite lymphadenopathy.    10/16/2020 Cancer Staging   Staging form: Breast, AJCC 8th Edition - Clinical stage from 10/16/2020: Stage IA (cT1b, cN0, cM0, G2, ER+, PR+, HER2-) - Signed by [name], MD on 10/29/2020 Stage prefix: Initial diagnosis Histologic grading system: 3 grade system   10/16/2020 Pathology Results   Diagnosis 1. Breast, right, needle core biopsy, 6 o'clock mass - INVASIVE MAMMARY CARCINOMA. SEE NOTE - MAMMARY CARCINOMA IN SITU 2. Breast, right, needle core biopsy, 4 o'clock mass - INVASIVE MAMMARY CARCINOMA. SEE NOTE 3. Breast, right, needle core biopsy, 5 o'clock mass - INVASIVE MAMMARY CARCINOMA. SEE NOTE - MAMMARY CARCINOMA IN SITU Diagnosis Note 1. , 2 and 3. Carcinoma measures 0.5 cm in part 1, 0.4 cm in part 2 and 0.2 cm in part 3; and appears  grade 2.  ADDENDUM: 1. ,2 and 3. Immunohistochemical stain for E-cadherin is negative in the tumor cells, consistent with a lobular phenotype.  1. PROGNOSTIC INDICATORS Results: The tumor cells are NEGATIVE for Her2 (1+). Estrogen Receptor: 100%, POSITIVE, STRONG STAINING INTENSITY Progesterone Receptor: 50%, POSITIVE, MODERATE STAINING INTENSITY Proliferation Marker Ki67: 5%   10/24/2020 Initial Diagnosis   Malignant  neoplasm of lower-inner quadrant of right breast of female, estrogen receptor positive (HCC)   12/03/2020 Cancer Staging   Staging form: Breast, AJCC 8th Edition - Pathologic stage from 12/03/2020: Stage IA (pT1c, pN0(i+), cM0, G2, ER+, PR+, HER2-, Oncotype DX score: 23) - Signed by [name], MD on 03/05/2021 Stage prefix: Initial diagnosis Multigene prognostic tests performed: Oncotype DX Recurrence score range: Greater than or equal to 11 Histologic grading system: 3 grade system   12/03/2020 Definitive Surgery   FINAL MICROSCOPIC DIAGNOSIS:   A. 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 BREAST, RIGHT MEDIAL MARGIN, EXCISION:  -  Focal residual invasive and in situ lobular carcinoma  -  Margins uninvolved by carcinoma (0.6 cm)   C. BREAST, RIGHT INFERIOR MARGIN, EXCISION:  -  Focal residual lobular carcinoma in situ  -  Margins uninvolved by carcinoma   D. BREAST, RIGHT LATERAL MARGIN, EXCISION:  -  No residual carcinoma identified   E. BREAST, RIGHT SUPERIOR MARGIN, EXCISION:  -  No residual carcinoma identified   F. BREAST, RIGHT POSTERIOR MARGIN, EXCISION:  -  No residual carcinoma identified   G. LYMPH NODE, RIGHT AXILLARY, SENTINEL, EXCISION:  -  No carcinoma identified in one lymph node (0/1)  -  See comment   H. LYMPH NODE, RIGHT AXILLARY, SENTINEL, EXCISION:  -  No carcinoma identified in one lymph node (0/1)  -  See comment   I. LYMPH NODE, RIGHT AXILLARY, SENTINEL, EXCISION:  -  No carcinoma identified in one lymph node (0/1)  -  See comment   J. LYMPH NODE, RIGHT AXILLARY, SENTINEL, EXCISION:  -  Isolated tumor cells present in one lymph node (0i/1)  -  See comment    12/03/2020 Oncotype testing   Oncotype DX was  obtained on the final surgical sample and the recurrence score of 23 predicts a risk of recurrence outside the breast over the next 9 years of 9%, if the patient's only systemic therapy is an antiestrogen for 5 years.  It also predicts no benefit from chemotherapy.    01/29/2021 - 03/09/2021 Anti-estrogen oral therapy   01/29/2021 through 03/09/2021 Site Technique Total Dose (Gy) Dose per Fx (Gy) Completed Fx Beam Energies  Breast, Right: Breast_R 3D 50.4/50.4 1.8 28/28 6X     04/2021 -  Anti-estrogen oral therapy   Anastrozole daily      INTERVAL HISTORY:  Cosette Prindle is here for a follow up of right breast cancer. She was last seen by  NP Mardella Layman on 03/05/2022. She presents to the clinic alone. Pt state that she was having some  right breast pain. She states that it comes and goes. She describe the pain as an aching pain in her right breast.     All other systems were reviewed with the patient and are negative.  MEDICAL HISTORY:  Past Medical History:  Diagnosis Date   Anxiety    takes Xanax daily    Arthritis    Breast cancer (HCC)    Depression    takes Pristiq and Wellbutrin daily   GERD (gastroesophageal reflux disease)    History of radiation therapy    right breast  01/29/2021-03/09/2021  Dr [name]   Joint pain    Joint swelling    Nocturia    Seasonal allergies    Takes Zyrtec nightly along with Nasal Spray   Vitamin D deficiency    new script for Vit D to start 08/22/15    SURGICAL HISTORY: Past Surgical History:  Procedure Laterality Date   blephorplasty Bilateral    BREAST LUMPECTOMY WITH RADIOACTIVE SEED AND SENTINEL LYMPH NODE BIOPSY Right 12/03/2020   Procedure: RIGHT BREAST LUMPECTOMY WITH RADIOACTIVE SEEDS X3 AND AXILLARY SENTINEL LYMPH NODE BIOPSY;  Surgeon: [name], MD;  Location: [facility];  Service: General;  Laterality: Right;   BREAST RECONSTRUCTION Right 12/09/2020   Procedure: RIGHT ONCOPLASTIC BREAST RECONSTRUCTION;   Surgeon: Glenna Fellows, MD;  Location: [facility];  Service: Plastics;  Laterality: Right;   BREAST REDUCTION SURGERY Left 12/09/2020   Procedure: LEFT MAMMARY REDUCTION  (BREAST);  Surgeon: Glenna Fellows, MD;  Location: [facility];  Service: Plastics;  Laterality: Left;   COLONOSCOPY     left leg surgery     rod   TOTAL KNEE ARTHROPLASTY Right 09/01/2015   TOTAL KNEE ARTHROPLASTY Right 09/01/2015   Procedure: TOTAL KNEE ARTHROPLASTY;  Surgeon: Gean Birchwood, MD;  Location: [facility];  Service: Orthopedics;  Laterality: Right;    I have reviewed the social history and family history with the patient and they are unchanged from previous note.  ALLERGIES:  is allergic to codeine and lisinopril.  MEDICATIONS:  Current Outpatient Medications  Medication Sig Dispense Refill   ALPRAZolam (XANAX) 0.5 MG tablet Take 0.5 mg by mouth 3 (three) times daily as needed for anxiety (pt reports taking TID).     anastrozole (ARIMIDEX) 1 MG tablet TAKE 1 TABLET BY MOUTH EVERY DAY 90 tablet 1   azelastine (ASTELIN) 0.1 % nasal spray Place 2 sprays into both nostrils 2 (two) times daily. Use in each nostril as directed     buPROPion (WELLBUTRIN XL) 300 MG 24 hr tablet Take 1 tablet by mouth daily.     calcium carbonate (OS-CAL - DOSED IN MG OF ELEMENTAL CALCIUM) 1250 (500 Ca) MG tablet Take by mouth.     cetirizine (ZYRTEC) 10 MG tablet Take 10 mg by mouth at bedtime.      Cholecalciferol 50 MCG (2000 UT) TABS 1 tablet     demeclocycline (DECLOMYCIN) 150 MG tablet Take 150 mg by mouth every other day.     desvenlafaxine (PRISTIQ) 50 MG 24 hr tablet Take 50 mg by mouth daily.      lansoprazole (PREVACID) 15 MG capsule Take 15 mg by mouth daily. At 6AM     Melatonin 10 MG TABS Take 10 mg by mouth at bedtime.     rosuvastatin (CRESTOR) 5 MG tablet TAKE 1 TABLET (5 MG TOTAL) BY MOUTH DAILY. 90 tablet 1   telmisartan (MICARDIS) 80 MG tablet Take 80 mg by mouth daily.     zolpidem  (AMBIEN) 10 MG tablet Take 10 mg by mouth at bedtime.     No current facility-administered medications for this visit.    PHYSICAL EXAMINATION: ECOG PERFORMANCE STATUS: 0 - Asymptomatic  Vitals:  07/19/22 1600  BP: (!) 146/87  Pulse: (!) 107  Resp: 17  Temp: 98.1 F (36.7 C)  SpO2: 99%   Wt Readings from Last 3 Encounters:  07/19/22 137 lb 11.2 oz (62.5 kg)  04/26/22 135 lb 4 oz (61.3 kg)  03/05/22 138 lb 8 oz (62.8 kg)     GENERAL:alert, no distress and comfortable SKIN: skin color normal, no rashes or significant lesions EYES: normal, Conjunctiva are pink and non-injected, sclera clear  NEURO: alert & oriented x 3 with fluent speech NECK:(-) supple, thyroid normal size, non-tender, without nodularity LYMPH: (-) no palpable lymphadenopathy in the cervical, axillary  LUNGS: clear to auscultation and percussion with normal breathing effort HEART: regular rate & rhythm and no murmurs and no lower extremity edema BREAST: RT breast minimum scar tissue no palpable mass breast exam benign, LT breast no palpable mass, breast exam benign. LABORATORY DATA:  I have reviewed the data as listed    Latest Ref Rng & Units 03/05/2022   11:41 AM 09/04/2021    2:24 PM 10/29/2020   12:12 PM  CBC  WBC 4.0 - 10.5 K/uL 5.3  5.5  6.2   Hemoglobin 12.0 - 15.0 g/dL 40.9  81.1  91.4   Hematocrit 36.0 - 46.0 % 34.7  32.0  39.9   Platelets 150 - 400 K/uL 274  309  352         Latest Ref Rng & Units 03/05/2022   11:41 AM 09/04/2021    2:24 PM 03/06/2021    3:15 PM  CMP  Glucose 70 - 99 mg/dL 782  87  956   BUN 8 - 23 mg/dL 14  13  16    Creatinine 0.44 - 1.00 mg/dL 2.13  0.86  5.78   Sodium 135 - 145 mmol/L 135  136  138   Potassium 3.5 - 5.1 mmol/L 4.0  4.4  4.4   Chloride 98 - 111 mmol/L 102  103  103   CO2 22 - 32 mmol/L 28  29  27    Calcium 8.9 - 10.3 mg/dL 9.3  9.8  9.9   Total Protein 6.5 - 8.1 g/dL 7.1  6.8    Total Bilirubin 0.3 - 1.2 mg/dL 0.5  0.5    Alkaline Phos 38 - 126 U/L 78   77    AST 15 - 41 U/L 16  14    ALT 0 - 44 U/L 11  9        RADIOGRAPHIC STUDIES: I have personally reviewed the radiological images as listed and agreed with the findings in the report. No results found.    No orders of the defined types were placed in this encounter.  All questions were answered. The patient knows to call the clinic with any problems, questions or concerns. No barriers to learning was detected. The total time spent in the appointment was 20 minutes.     [name], MD 07/19/2022   Carolin Coy, CMA, am acting as scribe for [name], MD.   I have reviewed the above documentation for accuracy and completeness, and I agree with the above.

## 2022-07-22 DIAGNOSIS — E782 Mixed hyperlipidemia: Secondary | ICD-10-CM | POA: Diagnosis not present

## 2022-07-23 LAB — LIPID PANEL
Chol/HDL Ratio: 2.2 ratio (ref 0.0–4.4)
Cholesterol, Total: 164 mg/dL (ref 100–199)
HDL: 76 mg/dL (ref >39)
LDL Chol Calc (NIH): 68 mg/dL (ref 0–99)
Triglycerides: 115 mg/dL (ref 0–149)
VLDL Cholesterol Cal: 20 mg/dL (ref 5–40)

## 2022-07-26 ENCOUNTER — Other Ambulatory Visit: Payer: Self-pay | Admitting: Hematology

## 2022-07-26 ENCOUNTER — Ambulatory Visit: Payer: Medicare PPO | Attending: General Surgery | Admitting: Rehabilitation

## 2022-07-26 DIAGNOSIS — Z170 Estrogen receptor positive status [ER+]: Secondary | ICD-10-CM | POA: Insufficient documentation

## 2022-07-26 DIAGNOSIS — Z483 Aftercare following surgery for neoplasm: Secondary | ICD-10-CM | POA: Insufficient documentation

## 2022-07-26 DIAGNOSIS — C50311 Malignant neoplasm of lower-inner quadrant of right female breast: Secondary | ICD-10-CM | POA: Insufficient documentation

## 2022-07-26 NOTE — Therapy (Signed)
OUTPATIENT PHYSICAL THERAPY SOZO SCREENING NOTE   Patient Name: [name] MRN: [id number] DOB:06/09/1950, 72 y.o., female Today's Date: 07/26/2022  PCP: [name], PA-C REFERRING PROVIDER: [name], MD   PT End of Session - 07/26/22 1521     Visit Number 2   screen   PT Start Time 1518    PT Stop Time 1521    PT Time Calculation (min) 3 min    Activity Tolerance Patient tolerated treatment well    Behavior During Therapy [facility] for tasks assessed/performed             Past Medical History:  Diagnosis Date   Anxiety    takes Xanax daily    Arthritis    Breast cancer (HCC)    Depression    takes Pristiq and Wellbutrin daily   GERD (gastroesophageal reflux disease)    History of radiation therapy    right breast  01/29/2021-03/09/2021  Dr [name]   Joint pain    Joint swelling    Nocturia    Seasonal allergies    Takes Zyrtec nightly along with Nasal Spray   Vitamin D deficiency    new script for Vit D to start 08/22/15   Past Surgical History:  Procedure Laterality Date   blephorplasty Bilateral    BREAST LUMPECTOMY WITH RADIOACTIVE SEED AND SENTINEL LYMPH NODE BIOPSY Right 12/03/2020   Procedure: RIGHT BREAST LUMPECTOMY WITH RADIOACTIVE SEEDS X3 AND AXILLARY SENTINEL LYMPH NODE BIOPSY;  Surgeon: [name], MD;  Location: [facility];  Service: General;  Laterality: Right;   BREAST RECONSTRUCTION Right 12/09/2020   Procedure: RIGHT ONCOPLASTIC BREAST RECONSTRUCTION;  Surgeon: Glenna Fellows, MD;  Location: [facility];  Service: Plastics;  Laterality: Right;   BREAST REDUCTION SURGERY Left 12/09/2020   Procedure: LEFT MAMMARY REDUCTION  (BREAST);  Surgeon: Glenna Fellows, MD;  Location: [facility];  Service: Plastics;  Laterality: Left;   COLONOSCOPY     left leg surgery     rod   TOTAL KNEE ARTHROPLASTY Right 09/01/2015   TOTAL KNEE ARTHROPLASTY Right 09/01/2015   Procedure: TOTAL KNEE  ARTHROPLASTY;  Surgeon: Gean Birchwood, MD;  Location: [facility];  Service: Orthopedics;  Laterality: Right;   Patient Active Problem List   Diagnosis Date Noted   Malignant neoplasm of lower-inner quadrant of right breast of female, estrogen receptor positive (HCC) 10/24/2020   Anxiety 10/28/2016   Allergic rhinitis 10/28/2016   HTN (hypertension) 10/28/2016   Constipation 10/28/2016   Insomnia 10/28/2016   Vitamin D deficiency 10/28/2016   Hyponatremia 10/26/2016   Primary osteoarthritis of right knee 08/28/2015    REFERRING DIAG: right breast cancer at risk for lymphedema  THERAPY DIAG: Aftercare following surgery for neoplasm  Malignant neoplasm of lower-inner quadrant of right breast of female, estrogen receptor positive (HCC)  PERTINENT HISTORY: Patient was diagnosed on 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 She underwent a right lumpectomy and sentinel node biopsy (4 nodes all negative except one with isolated tumor cells) on 12/03/2020. She then underwent a bilateral breast reduction on 12/09/2020.. It is ER/PR positive and HER2 negative with a Ki67 of 5%.   PRECAUTIONS: right UE Lymphedema risk, None  SUBJECTIVE: Pt returns for her 3 month L-Dex screen.   PAIN:  Are you having pain? No  SOZO SCREENING: Patient was assessed today using the SOZO machine to determine the lymphedema index score. This was compared to her baseline score. It was determined that she is within  the recommended range when compared to her baseline and no further action is needed at this time. She will continue SOZO screenings. These are done every 3 months for 2 years post operatively followed by every 6 months for 2 years, and then annually.   L-DEX FLOWSHEETS - 07/26/22 1500       L-DEX LYMPHEDEMA SCREENING   Measurement Type Unilateral    L-DEX MEASUREMENT EXTREMITY Upper Extremity    POSITION  Standing    DOMINANT SIDE Right    At Risk Side Right    BASELINE SCORE  (UNILATERAL) 0.7    L-DEX SCORE (UNILATERAL) -4.2    VALUE CHANGE (UNILAT) -4.9              Valeria Boza R, PT 07/26/2022, 3:22 PM

## 2022-07-30 ENCOUNTER — Encounter (HOSPITAL_BASED_OUTPATIENT_CLINIC_OR_DEPARTMENT_OTHER): Payer: Self-pay | Admitting: Internal Medicine

## 2022-07-30 ENCOUNTER — Ambulatory Visit (HOSPITAL_BASED_OUTPATIENT_CLINIC_OR_DEPARTMENT_OTHER): Payer: Medicare PPO | Admitting: Internal Medicine

## 2022-07-30 VITALS — BP 134/88 | HR 60 | Ht 64.0 in | Wt 138.2 lb

## 2022-07-30 DIAGNOSIS — E785 Hyperlipidemia, unspecified: Secondary | ICD-10-CM | POA: Insufficient documentation

## 2022-07-30 NOTE — Patient Instructions (Signed)
Medication Instructions:  Your physician recommends that you continue on your current medications as directed. Please refer to the Current Medication list given to you today.  *If you need a refill on your cardiac medications before your next appointment, please call your pharmacy*   Follow-Up: At [facility], you and your health needs are our priority.  As part of our continuing mission to provide you with exceptional heart care, we have created designated Provider Care Teams.  These Care Teams include your primary Cardiologist (physician) and Advanced Practice Providers (APPs -  Physician Assistants and Nurse Practitioners) who all work together to provide you with the care you need, when you need it.  We recommend signing up for the patient portal called "MyChart".  Sign up information is provided on this After Visit Summary.  MyChart is used to connect with patients for Virtual Visits (Telemedicine).  Patients are able to view lab/test results, encounter notes, upcoming appointments, etc.  Non-urgent messages can be sent to your provider as well.   To learn more about what you can do with MyChart, go to [email or web address].    Your next appointment:   As needed  Provider:   K. Italy Hilty, MD

## 2022-07-30 NOTE — Progress Notes (Signed)
OFFICE CONSULT NOTE  Chief Complaint:  Follow-up  Primary Care Physician: [name], PA-C  HPI:  [name] is a 72 y.o. female who is being seen today for the evaluation of syncope at the request of [name], [place].  This is a pleasant 72 year old female kindly referred for evaluation management of syncope.  She has had a number of recent episodes of syncope including one that happened during a meal while she was seated.  She did have somewhat of a prodrome prior to that and afterwards felt pale, cool and diaphoretic.  She has a history of hyponatremia followed by Dr. Everardo All.  EMS work-up was unremarkable.  She was given fluids because her blood pressure was low with systolic in the 70s.  She responded to that.  She reports a history of significant anxiety.  She also has heart disease in both parents.  She had lab work that showed a total cholesterol 273, HDL 119 LDL 144 and triglycerides 63.  She also has a history of essential hypertension with good blood pressure control.  07/30/2020  [name] returns today for follow-up.  She is done very well.  Fortunately she has had no more syncopal episodes recently.  She had a calcium score of 1 in April which was 45th percentile for age and sex matched control.  Not particularly high risk and I gave her the option of therapy for her lipids or watchful waiting.  She wanted to go ahead and start treatment and therefore I placed her on rosuvastatin.  LDL has been as high as 180 but her HDL was over 100 as well.  Subsequently repeat lipids have shown marked improvement with an LDL-P of 8034, HDL 108, small LDL-P less than 90, APO B of 67, total cholesterol 199, HDL 108, triglycerides 61 and LDL of 80.  Generally this is a very favorable lipid profile.  Unfortunately she was also found to have a dilated ascending aorta 42 mm.  This was a noncontrast study however would recommend a repeat which we have ordered for April of next  year.  07/03/2021  [name] is seen today in follow-up.  Her lipids are improved on low-dose rosuvastatin.  LDL is 78, HDL 98, LDL particle #746, down from 1034.  Overall she is tolerating this well.  This is a very favorable lipid profile.  She has not had follow-up CT of a dilated ascending aorta to 42 mm.  This was last assessed in April 2022.  In addition in November she had a fall and in the emergency department had scans of her neck and head.  This caught the top of the lung and noted to have multiple pulmonary nodules which was suggested to be followed up in a year.  Based on this I would advise that she have a contrast CT scan to evaluate both of these.  07/30/2022  [name] returns today for follow-up.  Overall she is doing well.  She had recent repeat lipids which showed total cholesterol 164, triglycerides 115, HDL 76 and LDL 68.  She is tolerating low-dose rosuvastatin without issues.  As previously noted she had a calcium score of 1 which was very low risk less than 50th percentile.  This also showed a dilated ascending aorta however was noncontrast and a follow-up contrast study showed that the aorta was normal.  She had some small pulmonary nodules however those also resolved on repeat imaging.  PMHx:  Past Medical History:  Diagnosis Date  Anxiety    takes Xanax daily    Arthritis    Breast cancer (HCC)    Depression    takes Pristiq and Wellbutrin daily   GERD (gastroesophageal reflux disease)    History of radiation therapy    right breast  01/29/2021-03/09/2021  Dr [name]   Joint pain    Joint swelling    Nocturia    Seasonal allergies    Takes Zyrtec nightly along with Nasal Spray   Vitamin D deficiency    new script for Vit D to start 08/22/15    Past Surgical History:  Procedure Laterality Date   blephorplasty Bilateral    BREAST LUMPECTOMY WITH RADIOACTIVE SEED AND SENTINEL LYMPH NODE BIOPSY Right 12/03/2020   Procedure: RIGHT BREAST LUMPECTOMY WITH  RADIOACTIVE SEEDS X3 AND AXILLARY SENTINEL LYMPH NODE BIOPSY;  Surgeon: [name], MD;  Location: [facility];  Service: General;  Laterality: Right;   BREAST RECONSTRUCTION Right 12/09/2020   Procedure: RIGHT ONCOPLASTIC BREAST RECONSTRUCTION;  Surgeon: Glenna Fellows, MD;  Location: [facility];  Service: Plastics;  Laterality: Right;   BREAST REDUCTION SURGERY Left 12/09/2020   Procedure: LEFT MAMMARY REDUCTION  (BREAST);  Surgeon: Glenna Fellows, MD;  Location: [facility];  Service: Plastics;  Laterality: Left;   COLONOSCOPY     left leg surgery     rod   TOTAL KNEE ARTHROPLASTY Right 09/01/2015   TOTAL KNEE ARTHROPLASTY Right 09/01/2015   Procedure: TOTAL KNEE ARTHROPLASTY;  Surgeon: Gean Birchwood, MD;  Location: [facility];  Service: Orthopedics;  Laterality: Right;    FAMHx:  Family History  Problem Relation Age of Onset   Cancer Maternal Grandmother 66       colon cancer   Other Neg Hx        hyponatremia    SOCHx:   reports that she has never smoked. She has never used smokeless tobacco. She reports current alcohol use of about 2.0 standard drinks of alcohol per week. She reports that she does not use drugs.  ALLERGIES:  Allergies  Allergen Reactions   Codeine Nausea And Vomiting    Sick to stomach   Lisinopril Cough    Other reaction(s): Cough    ROS: Pertinent items noted in HPI and remainder of comprehensive ROS otherwise negative.  HOME MEDS: Current Outpatient Medications on File Prior to Visit  Medication Sig Dispense Refill   ALPRAZolam (XANAX) 0.5 MG tablet Take 0.5 mg by mouth 3 (three) times daily as needed for anxiety (pt reports taking TID).     anastrozole (ARIMIDEX) 1 MG tablet TAKE 1 TABLET BY MOUTH EVERY DAY 90 tablet 1   azelastine (ASTELIN) 0.1 % nasal spray Place 2 sprays into both nostrils 2 (two) times daily. Use in each nostril as directed     buPROPion (WELLBUTRIN XL) 300 MG 24 hr tablet Take 1  tablet by mouth daily.     calcium carbonate (OS-CAL - DOSED IN MG OF ELEMENTAL CALCIUM) 1250 (500 Ca) MG tablet Take by mouth.     cetirizine (ZYRTEC) 10 MG tablet Take 10 mg by mouth at bedtime.      Cholecalciferol 50 MCG (2000 UT) TABS 1 tablet     desvenlafaxine (PRISTIQ) 50 MG 24 hr tablet Take 50 mg by mouth daily.      lansoprazole (PREVACID) 15 MG capsule Take 15 mg by mouth daily. At 6AM     Melatonin 10 MG TABS Take 10 mg by mouth at bedtime.  rosuvastatin (CRESTOR) 5 MG tablet TAKE 1 TABLET (5 MG TOTAL) BY MOUTH DAILY. 90 tablet 1   telmisartan (MICARDIS) 80 MG tablet Take 80 mg by mouth daily.     zolpidem (AMBIEN) 10 MG tablet Take 10 mg by mouth at bedtime.     demeclocycline (DECLOMYCIN) 150 MG tablet Take 150 mg by mouth every other day. (Patient not taking: Reported on 07/30/2022)     No current facility-administered medications on file prior to visit.    LABS/IMAGING: No results found for this or any previous visit (from the past 48 hour(s)). No results found.  LIPID PANEL:    Component Value Date/Time   CHOL 164 07/22/2022 1318   TRIG 115 07/22/2022 1318   HDL 76 07/22/2022 1318   CHOLHDL 2.2 07/22/2022 1318   LDLCALC 68 07/22/2022 1318    WEIGHTS: Wt Readings from Last 3 Encounters:  07/30/22 138 lb 3.2 oz (62.7 kg)  07/19/22 137 lb 11.2 oz (62.5 kg)  04/26/22 135 lb 4 oz (61.3 kg)    VITALS: BP 134/88   Pulse 60   Ht 5\' 4"  (1.626 m)   Wt 138 lb 3.2 oz (62.7 kg)   LMP  (LMP Unknown)   SpO2 98%   BMI 23.72 kg/m   EXAM: Deferred  EKG: Deferred  ASSESSMENT: Probable vasovagal syncope Chronic hyponatremia on demeclocycline Family history of heart disease Anxiety CAC score of 1-45th percentile (04/2020) Dilated ascending aorta 42 mm (04/2020)-> however measured normal with a contrasted CT at 37 mm (07/2021) Multiple bilateral pulmonary nodules (by CT-11/2020) -> resolved by repeat imaging and consider benign  PLAN: 1.   Ms. Henes continues  to have excellent control of her cholesterol on low-dose rosuvastatin.  Her ascending aorta is not dilated by contrast CT and the pulmonary nodules have resolved.  Neither 1 of these are issues that need to be followed.  I think she could be followed closely by her primary care provider if she were willing to prescribe her rosuvastatin.  I am happy to see her back on an as-needed basis.  Thanks for allowing me to participate in her care.  Chrystie Nose, MD, [facility], FACP  [place]  [facility]  Medical Director of the [facility] Lipid Disorders &  Cardiovascular Risk Reduction Clinic Diplomate of the American Board of Clinical Lipidology Attending Cardiologist  Direct Dial: [phone]  Fax: [phone]  Website:  [email or web address] Nicely Braeley Buskey 07/30/2022, 2:24 PM

## 2022-09-03 ENCOUNTER — Telehealth: Payer: Self-pay | Admitting: Adult Health

## 2022-09-03 NOTE — Telephone Encounter (Signed)
 Rescheduled appointment per provider PAL. Left voicemail with appointment details.

## 2022-09-08 ENCOUNTER — Telehealth: Payer: Self-pay | Admitting: Adult Health

## 2022-09-09 ENCOUNTER — Ambulatory Visit: Payer: Medicare PPO | Admitting: Adult Health

## 2022-09-09 ENCOUNTER — Other Ambulatory Visit: Payer: Medicare PPO

## 2022-09-16 ENCOUNTER — Other Ambulatory Visit: Payer: Self-pay | Admitting: Internal Medicine

## 2022-09-20 ENCOUNTER — Ambulatory Visit: Payer: Medicare PPO | Admitting: Adult Health

## 2022-09-20 ENCOUNTER — Other Ambulatory Visit: Payer: Medicare PPO

## 2022-10-05 DIAGNOSIS — F419 Anxiety disorder, unspecified: Secondary | ICD-10-CM | POA: Diagnosis not present

## 2022-10-05 DIAGNOSIS — I10 Essential (primary) hypertension: Secondary | ICD-10-CM | POA: Diagnosis not present

## 2022-10-05 DIAGNOSIS — R7301 Impaired fasting glucose: Secondary | ICD-10-CM | POA: Diagnosis not present

## 2022-10-05 DIAGNOSIS — E7800 Pure hypercholesterolemia, unspecified: Secondary | ICD-10-CM | POA: Diagnosis not present

## 2022-10-05 DIAGNOSIS — J309 Allergic rhinitis, unspecified: Secondary | ICD-10-CM | POA: Diagnosis not present

## 2022-10-05 DIAGNOSIS — Z0000 Encounter for general adult medical examination without abnormal findings: Secondary | ICD-10-CM | POA: Diagnosis not present

## 2022-10-05 DIAGNOSIS — C50911 Malignant neoplasm of unspecified site of right female breast: Secondary | ICD-10-CM | POA: Diagnosis not present

## 2022-10-05 DIAGNOSIS — Z23 Encounter for immunization: Secondary | ICD-10-CM | POA: Diagnosis not present

## 2022-10-05 DIAGNOSIS — E871 Hypo-osmolality and hyponatremia: Secondary | ICD-10-CM | POA: Diagnosis not present

## 2022-10-14 DIAGNOSIS — I10 Essential (primary) hypertension: Secondary | ICD-10-CM | POA: Diagnosis not present

## 2022-10-14 DIAGNOSIS — Z853 Personal history of malignant neoplasm of breast: Secondary | ICD-10-CM | POA: Diagnosis not present

## 2022-10-14 DIAGNOSIS — Z9011 Acquired absence of right breast and nipple: Secondary | ICD-10-CM | POA: Diagnosis not present

## 2022-10-14 DIAGNOSIS — R928 Other abnormal and inconclusive findings on diagnostic imaging of breast: Secondary | ICD-10-CM | POA: Diagnosis not present

## 2022-10-18 ENCOUNTER — Encounter: Payer: Self-pay | Admitting: Hematology

## 2022-11-10 DIAGNOSIS — H18593 Other hereditary corneal dystrophies, bilateral: Secondary | ICD-10-CM | POA: Diagnosis not present

## 2022-11-10 DIAGNOSIS — H43812 Vitreous degeneration, left eye: Secondary | ICD-10-CM | POA: Diagnosis not present

## 2022-11-10 DIAGNOSIS — H16223 Keratoconjunctivitis sicca, not specified as Sjogren's, bilateral: Secondary | ICD-10-CM | POA: Diagnosis not present

## 2022-11-10 DIAGNOSIS — H02403 Unspecified ptosis of bilateral eyelids: Secondary | ICD-10-CM | POA: Diagnosis not present

## 2022-11-10 DIAGNOSIS — H25043 Posterior subcapsular polar age-related cataract, bilateral: Secondary | ICD-10-CM | POA: Diagnosis not present

## 2022-11-12 ENCOUNTER — Encounter: Payer: Medicare PPO | Admitting: Adult Health

## 2022-11-12 ENCOUNTER — Other Ambulatory Visit: Payer: Medicare PPO

## 2022-11-16 ENCOUNTER — Ambulatory Visit: Payer: Medicare PPO | Attending: General Surgery | Admitting: Rehabilitation

## 2022-11-16 DIAGNOSIS — Z170 Estrogen receptor positive status [ER+]: Secondary | ICD-10-CM | POA: Insufficient documentation

## 2022-11-16 DIAGNOSIS — C50311 Malignant neoplasm of lower-inner quadrant of right female breast: Secondary | ICD-10-CM | POA: Insufficient documentation

## 2022-11-16 DIAGNOSIS — Z483 Aftercare following surgery for neoplasm: Secondary | ICD-10-CM | POA: Insufficient documentation

## 2022-11-16 NOTE — Therapy (Signed)
OUTPATIENT PHYSICAL THERAPY SOZO SCREENING NOTE   Patient Name: [name] MRN: [id number] DOB:04-Feb-1950, 72 y.o., female Today's Date: 11/16/2022  PCP: [name], PA-C REFERRING PROVIDER: [name], MD   PT End of Session - 11/16/22 1259     Visit Number 2   screen   PT Start Time 1158    PT Stop Time 1201    PT Time Calculation (min) 3 min    Activity Tolerance Patient tolerated treatment well    Behavior During Therapy [facility] for tasks assessed/performed             Past Medical History:  Diagnosis Date   Anxiety    takes Xanax daily    Arthritis    Breast cancer (HCC)    Depression    takes Pristiq and Wellbutrin daily   GERD (gastroesophageal reflux disease)    History of radiation therapy    right breast  01/29/2021-03/09/2021  Dr [name]   Joint pain    Joint swelling    Nocturia    Seasonal allergies    Takes Zyrtec nightly along with Nasal Spray   Vitamin D deficiency    new script for Vit D to start 08/22/15   Past Surgical History:  Procedure Laterality Date   blephorplasty Bilateral    BREAST LUMPECTOMY WITH RADIOACTIVE SEED AND SENTINEL LYMPH NODE BIOPSY Right 12/03/2020   Procedure: RIGHT BREAST LUMPECTOMY WITH RADIOACTIVE SEEDS X3 AND AXILLARY SENTINEL LYMPH NODE BIOPSY;  Surgeon: [name], MD;  Location: [facility];  Service: General;  Laterality: Right;   BREAST RECONSTRUCTION Right 12/09/2020   Procedure: RIGHT ONCOPLASTIC BREAST RECONSTRUCTION;  Surgeon: Glenna Fellows, MD;  Location: [facility];  Service: Plastics;  Laterality: Right;   BREAST REDUCTION SURGERY Left 12/09/2020   Procedure: LEFT MAMMARY REDUCTION  (BREAST);  Surgeon: Glenna Fellows, MD;  Location: [facility];  Service: Plastics;  Laterality: Left;   COLONOSCOPY     left leg surgery     rod   TOTAL KNEE ARTHROPLASTY Right 09/01/2015   TOTAL KNEE ARTHROPLASTY Right 09/01/2015   Procedure: TOTAL KNEE  ARTHROPLASTY;  Surgeon: Gean Birchwood, MD;  Location: [facility];  Service: Orthopedics;  Laterality: Right;   Patient Active Problem List   Diagnosis Date Noted   Dyslipidemia, goal LDL below 70 07/30/2022   Malignant neoplasm of lower-inner quadrant of right breast of female, estrogen receptor positive (HCC) 10/24/2020   Anxiety 10/28/2016   Allergic rhinitis 10/28/2016   HTN (hypertension) 10/28/2016   Constipation 10/28/2016   Insomnia 10/28/2016   Vitamin D deficiency 10/28/2016   Hyponatremia 10/26/2016   Primary osteoarthritis of right knee 08/28/2015    REFERRING DIAG: right breast cancer at risk for lymphedema  THERAPY DIAG: Aftercare following surgery for neoplasm  Malignant neoplasm of lower-inner quadrant of right breast of female, estrogen receptor positive (HCC)  PERTINENT HISTORY: Patient was diagnosed on 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 She underwent a right lumpectomy and sentinel node biopsy (4 nodes all negative except one with isolated tumor cells) on 12/03/2020. She then underwent a bilateral breast reduction on 12/09/2020.. It is ER/PR positive and HER2 negative with a Ki67 of 5%.   PRECAUTIONS: right UE Lymphedema risk, None  SUBJECTIVE: Pt returns for her 3 month L-Dex screen.   PAIN:  Are you having pain? No  SOZO SCREENING: Patient was assessed today using the SOZO machine to determine the lymphedema index score. This was compared to her baseline  score. It was determined that she is within the recommended range when compared to her baseline and no further action is needed at this time. She will continue SOZO screenings. These are done every 3 months for 2 years post operatively followed by every 6 months for 2 years, and then annually.   L-DEX FLOWSHEETS - 11/16/22 1200       L-DEX LYMPHEDEMA SCREENING   Measurement Type Unilateral    L-DEX MEASUREMENT EXTREMITY Upper Extremity    POSITION  Standing    DOMINANT SIDE Right    At  Risk Side Right    BASELINE SCORE (UNILATERAL) 0.7    L-DEX SCORE (UNILATERAL) -6.7    VALUE CHANGE (UNILAT) -7.4              Tita Terhaar R, PT 11/16/2022, 1:00 PM

## 2022-11-19 ENCOUNTER — Encounter: Payer: Self-pay | Admitting: Adult Health

## 2022-11-19 ENCOUNTER — Inpatient Hospital Stay (HOSPITAL_BASED_OUTPATIENT_CLINIC_OR_DEPARTMENT_OTHER): Payer: Medicare PPO | Admitting: Adult Health

## 2022-11-19 ENCOUNTER — Inpatient Hospital Stay: Payer: Medicare PPO | Attending: Adult Health

## 2022-11-19 VITALS — BP 140/90 | HR 82 | Temp 98.4°F | Resp 18 | Ht 64.0 in | Wt 136.6 lb

## 2022-11-19 DIAGNOSIS — Z79811 Long term (current) use of aromatase inhibitors: Secondary | ICD-10-CM | POA: Diagnosis not present

## 2022-11-19 DIAGNOSIS — Z170 Estrogen receptor positive status [ER+]: Secondary | ICD-10-CM

## 2022-11-19 DIAGNOSIS — Z1721 Progesterone receptor positive status: Secondary | ICD-10-CM | POA: Insufficient documentation

## 2022-11-19 DIAGNOSIS — Z800 Family history of malignant neoplasm of digestive organs: Secondary | ICD-10-CM | POA: Insufficient documentation

## 2022-11-19 DIAGNOSIS — R634 Abnormal weight loss: Secondary | ICD-10-CM | POA: Diagnosis not present

## 2022-11-19 DIAGNOSIS — C50311 Malignant neoplasm of lower-inner quadrant of right female breast: Secondary | ICD-10-CM | POA: Diagnosis not present

## 2022-11-19 DIAGNOSIS — Z79899 Other long term (current) drug therapy: Secondary | ICD-10-CM | POA: Insufficient documentation

## 2022-11-19 DIAGNOSIS — E785 Hyperlipidemia, unspecified: Secondary | ICD-10-CM | POA: Diagnosis not present

## 2022-11-19 LAB — CBC WITH DIFFERENTIAL (CANCER CENTER ONLY)
Abs Immature Granulocytes: 0.01 10*3/uL (ref 0.00–0.07)
Basophils Absolute: 0.1 10*3/uL (ref 0.0–0.1)
Basophils Relative: 1 %
Eosinophils Absolute: 0.3 10*3/uL (ref 0.0–0.5)
Eosinophils Relative: 5 %
HCT: 36.5 % (ref 36.0–46.0)
Hemoglobin: 12.6 g/dL (ref 12.0–15.0)
Immature Granulocytes: 0 %
Lymphocytes Relative: 22 %
Lymphs Abs: 1.3 10*3/uL (ref 0.7–4.0)
MCH: 32.6 pg (ref 26.0–34.0)
MCHC: 34.5 g/dL (ref 30.0–36.0)
MCV: 94.3 fL (ref 80.0–100.0)
Monocytes Absolute: 0.5 10*3/uL (ref 0.1–1.0)
Monocytes Relative: 8 %
Neutro Abs: 3.7 10*3/uL (ref 1.7–7.7)
Neutrophils Relative %: 64 %
Platelet Count: 335 10*3/uL (ref 150–400)
RBC: 3.87 MIL/uL (ref 3.87–5.11)
RDW: 12.7 % (ref 11.5–15.5)
WBC Count: 5.7 10*3/uL (ref 4.0–10.5)
nRBC: 0 % (ref 0.0–0.2)

## 2022-11-19 LAB — CMP (CANCER CENTER ONLY)
ALT: 13 U/L (ref 0–44)
AST: 16 U/L (ref 15–41)
Albumin: 4.7 g/dL (ref 3.5–5.0)
Alkaline Phosphatase: 84 U/L (ref 38–126)
Anion gap: 5 (ref 5–15)
BUN: 15 mg/dL (ref 8–23)
CO2: 31 mmol/L (ref 22–32)
Calcium: 10.1 mg/dL (ref 8.9–10.3)
Chloride: 100 mmol/L (ref 98–111)
Creatinine: 0.85 mg/dL (ref 0.44–1.00)
GFR, Estimated: >60 mL/min (ref >60)
Glucose, Bld: 123 mg/dL — ABNORMAL HIGH (ref 70–99)
Potassium: 4.1 mmol/L (ref 3.5–5.1)
Sodium: 136 mmol/L (ref 135–145)
Total Bilirubin: 0.6 mg/dL (ref <1.2)
Total Protein: 7.5 g/dL (ref 6.5–8.1)

## 2022-11-19 MED ORDER — ANASTROZOLE 1 MG PO TABS: 1.0000 mg | ORAL_TABLET | Freq: Every day | ORAL | 1 refills | Status: DC

## 2022-11-19 NOTE — Progress Notes (Unsigned)
[facility] Cancer Follow up:    Destiny Soho, PA-C [address] [place] [address]   DIAGNOSIS: Cancer Staging  Malignant neoplasm of lower-inner quadrant of right breast of female, estrogen receptor positive (HCC) Staging form: Breast, AJCC 8th Edition - Clinical stage from 10/16/2020: Stage IA (cT1b, cN0, cM0, G2, ER+, PR+, HER2-) - Signed by [name], MD on 10/29/2020 Stage prefix: Initial diagnosis Histologic grading system: 3 grade system - Pathologic stage from 12/03/2020: Stage IA (pT1c, pN0(i+), cM0, G2, ER+, PR+, HER2-, Oncotype DX score: 23) - Signed by [name], MD on 03/05/2021 Stage prefix: Initial diagnosis Multigene prognostic tests performed: Oncotype DX Recurrence score range: Greater than or equal to 11 Histologic grading system: 3 grade system   SUMMARY OF ONCOLOGIC HISTORY: Oncology History Overview Note   Cancer Staging  Malignant neoplasm of lower-inner quadrant of right breast of female, estrogen receptor positive (HCC) Staging form: Breast, AJCC 8th Edition - Clinical stage from 10/16/2020: Stage IA (cT1b, cN0, cM0, G2, ER+, PR+, HER2-) - Signed by [name], MD on 10/29/2020 - Pathologic stage from 12/03/2020: Stage IA (pT1c, pN0(i+), cM0, G2, ER+, PR+, HER2-, Oncotype DX score: 23) - Signed by [name], MD on 03/05/2021     Malignant neoplasm of lower-inner quadrant of right breast of female, estrogen receptor positive (HCC)  10/08/2020 Mammogram   Right Diagnostic MM and Right Breast US  -Three irregular masses in lower-inner right breast:   -8 mm at 5-6 o'clock, 4 mm by [place]   -4 mm at 5 o'clock in anterior depth, 2 mm by [place]   -5 mm at 5 o'clock middle depth, 3 mm by [place] -Stable oval mass in right breast at 4 o'clock is benign -[place] of right axilla demonstrates mildly prominent right axillary lymph nodes but no definite lymphadenopathy.    10/16/2020 Cancer Staging   Staging form: Breast, AJCC 8th Edition - Clinical stage  from 10/16/2020: Stage IA (cT1b, cN0, cM0, G2, ER+, PR+, HER2-) - Signed by [name], MD on 10/29/2020 Stage prefix: Initial diagnosis Histologic grading system: 3 grade system   10/16/2020 Pathology Results   Diagnosis 1. Breast, right, needle core biopsy, 6 o'clock mass - INVASIVE MAMMARY CARCINOMA. SEE NOTE - MAMMARY CARCINOMA IN SITU 2. Breast, right, needle core biopsy, 4 o'clock mass - INVASIVE MAMMARY CARCINOMA. SEE NOTE 3. Breast, right, needle core biopsy, 5 o'clock mass - INVASIVE MAMMARY CARCINOMA. SEE NOTE - MAMMARY CARCINOMA IN SITU Diagnosis Note 1. , 2 and 3. Carcinoma measures 0.5 cm in part 1, 0.4 cm in part 2 and 0.2 cm in part 3; and appears grade 2.  ADDENDUM: 1. ,2 and 3. Immunohistochemical stain for E-cadherin is negative in the tumor cells, consistent with a lobular phenotype.  1. PROGNOSTIC INDICATORS Results: The tumor cells are NEGATIVE for Her2 (1+). Estrogen Receptor: 100%, POSITIVE, STRONG STAINING INTENSITY Progesterone Receptor: 50%, POSITIVE, MODERATE STAINING INTENSITY Proliferation Marker Ki67: 5%   10/24/2020 Initial Diagnosis   Malignant neoplasm of lower-inner quadrant of right breast of female, estrogen receptor positive (HCC)   12/03/2020 Cancer Staging   Staging form: Breast, AJCC 8th Edition - Pathologic stage from 12/03/2020: Stage IA (pT1c, pN0(i+), cM0, G2, ER+, PR+, HER2-, Oncotype DX score: 23) - Signed by [name], MD on 03/05/2021 Stage prefix: Initial diagnosis Multigene prognostic tests performed: Oncotype DX Recurrence score range: Greater than or equal to 11 Histologic grading system: 3 grade system   12/03/2020 Definitive Surgery   FINAL MICROSCOPIC DIAGNOSIS:  A. 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 BREAST, RIGHT MEDIAL MARGIN, EXCISION:  -  Focal residual invasive and in situ lobular carcinoma  -  Margins uninvolved by carcinoma (0.6 cm)   C. BREAST, RIGHT INFERIOR MARGIN, EXCISION:  -  Focal residual lobular carcinoma in situ  -  Margins uninvolved by carcinoma   D. BREAST, RIGHT LATERAL MARGIN, EXCISION:  -  No residual carcinoma identified   E. BREAST, RIGHT SUPERIOR MARGIN, EXCISION:  -  No residual carcinoma identified   F. BREAST, RIGHT POSTERIOR MARGIN, EXCISION:  -  No residual carcinoma identified   G. LYMPH NODE, RIGHT AXILLARY, SENTINEL, EXCISION:  -  No carcinoma identified in one lymph node (0/1)  -  See comment   H. LYMPH NODE, RIGHT AXILLARY, SENTINEL, EXCISION:  -  No carcinoma identified in one lymph node (0/1)  -  See comment   I. LYMPH NODE, RIGHT AXILLARY, SENTINEL, EXCISION:  -  No carcinoma identified in one lymph node (0/1)  -  See comment   J. LYMPH NODE, RIGHT AXILLARY, SENTINEL, EXCISION:  -  Isolated tumor cells present in one lymph node (0i/1)  -  See comment    12/03/2020 Oncotype testing   Oncotype DX was obtained on the final surgical sample and the recurrence score of 23 predicts a risk of recurrence outside the breast over the next 9 years of 9%, if the patient's only systemic therapy is an antiestrogen for 5 years.  It also predicts no benefit from chemotherapy.    01/29/2021 - 03/09/2021 Anti-estrogen oral therapy   01/29/2021 through 03/09/2021 Site Technique Total Dose (Gy) Dose per Fx (Gy) Completed Fx Beam Energies  Breast, Right: Breast_R 3D 50.4/50.4 1.8 28/28 6X     04/2021 -  Anti-estrogen oral therapy   Anastrozole daily     CURRENT THERAPY:  INTERVAL HISTORY: [name] 72 y.o. female returns for    Patient Active Problem List   Diagnosis Date Noted  . Dyslipidemia, goal LDL below 70 07/30/2022  . Malignant neoplasm of lower-inner quadrant of right breast of female, estrogen receptor  positive (HCC) 10/24/2020  . Anxiety 10/28/2016  . Allergic rhinitis 10/28/2016  . HTN (hypertension) 10/28/2016  . Constipation 10/28/2016  . Insomnia 10/28/2016  . Vitamin D deficiency 10/28/2016  . Hyponatremia 10/26/2016  . Primary osteoarthritis of right knee 08/28/2015    is allergic to codeine and lisinopril.  MEDICAL HISTORY: Past Medical History:  Diagnosis Date  . Anxiety    takes Xanax daily   . Arthritis   . Breast cancer (HCC)   . Depression    takes Pristiq and Wellbutrin daily  . GERD (gastroesophageal reflux disease)   . History of radiation therapy    right breast  01/29/2021-03/09/2021  Dr [name]  . Joint pain   . Joint swelling   . Nocturia   . Seasonal allergies    Takes Zyrtec nightly along with Nasal Spray  . Vitamin D deficiency    new script for Vit D to start 08/22/15    SURGICAL HISTORY: Past Surgical History:  Procedure Laterality Date  . blephorplasty Bilateral   . BREAST LUMPECTOMY WITH RADIOACTIVE SEED AND SENTINEL LYMPH NODE BIOPSY  Right 12/03/2020   Procedure: RIGHT BREAST LUMPECTOMY WITH RADIOACTIVE SEEDS X3 AND AXILLARY SENTINEL LYMPH NODE BIOPSY;  Surgeon: [name], MD;  Location: [facility];  Service: General;  Laterality: Right;  . BREAST RECONSTRUCTION Right 12/09/2020   Procedure: RIGHT ONCOPLASTIC BREAST RECONSTRUCTION;  Surgeon: Glenna Fellows, MD;  Location: [facility];  Service: Plastics;  Laterality: Right;  . BREAST REDUCTION SURGERY Left 12/09/2020   Procedure: LEFT MAMMARY REDUCTION  (BREAST);  Surgeon: Glenna Fellows, MD;  Location: [facility];  Service: Plastics;  Laterality: Left;  . COLONOSCOPY    . left leg surgery     rod  . 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 TOTAL KNEE ARTHROPLASTY Right 09/01/2015   Procedure: TOTAL KNEE ARTHROPLASTY;  Surgeon: Gean Birchwood, MD;  Location: [facility];  Service: Orthopedics;  Laterality: Right;    SOCIAL  HISTORY: Social History   Socioeconomic History  . Marital status: Widowed    Spouse name: Not on file  . Number of children: 0  . Years of education: Not on file  . Highest education level: Not on file  Occupational History  . Not on file  Tobacco Use  . Smoking status: Never  . Smokeless tobacco: Never  Substance and Sexual Activity  . Alcohol use: Yes    Alcohol/week: 2.0 standard drinks of alcohol    Types: 2 Glasses of wine per week    Comment: couple times a wk  . Drug use: No  . Sexual activity: Not Currently    Birth control/protection: Post-menopausal  Other Topics Concern  . Not on file  Social History Narrative  . Not on file   Social Determinants of Health   Financial Resource Strain: Not on file  Food Insecurity: Not on file  Transportation Needs: Not on file  Physical Activity: Not on file  Stress: Not on file  Social Connections: Not on file  Intimate Partner Violence: Not on file    FAMILY HISTORY: Family History  Problem Relation Age of Onset  . Cancer Maternal Grandmother 72       colon cancer  . Other Neg Hx        hyponatremia    Review of Systems - Oncology    PHYSICAL EXAMINATION    There were no vitals filed for this visit.  Physical Exam  LABORATORY DATA:  CBC    Component Value Date/Time   WBC 5.7 11/19/2022 1332   WBC 10.9 (H) 03/08/2020 1709   RBC 3.87 11/19/2022 1332   HGB 12.6 11/19/2022 1332   HCT 36.5 11/19/2022 1332   PLT 335 11/19/2022 1332   MCV 94.3 11/19/2022 1332   MCH 32.6 11/19/2022 1332   MCHC 34.5 11/19/2022 1332   RDW 12.7 11/19/2022 1332   LYMPHSABS 1.3 11/19/2022 1332   MONOABS 0.5 11/19/2022 1332   EOSABS 0.3 11/19/2022 1332   BASOSABS 0.1 11/19/2022 1332    CMP     Component Value Date/Time   NA 135 03/05/2022 1141   NA 139 09/05/2015 1332   K 4.0 03/05/2022 1141   CL 102 03/05/2022 1141   CO2 28 03/05/2022 1141   GLUCOSE 110 (H) 03/05/2022 1141   BUN 14 03/05/2022 1141   BUN 6  09/05/2015 1332   CREATININE 0.95 03/05/2022 1141   CREATININE 0.91 03/06/2021 1515   CALCIUM 9.3 03/05/2022 1141   PROT 7.1 03/05/2022 1141   ALBUMIN 4.3 03/05/2022 1141   AST 16 03/05/2022 1141   ALT  11 03/05/2022 1141   ALKPHOS 78 03/05/2022 1141   BILITOT 0.5 03/05/2022 1141   GFRNONAA >60 03/05/2022 1141   GFRAA >60 09/02/2015 0510       PENDING LABS:   RADIOGRAPHIC STUDIES:  No results found.   PATHOLOGY:     ASSESSMENT and THERAPY PLAN:   No problem-specific Assessment & Plan notes found for this encounter.   Orders Placed This Encounter  Procedures  . CBC with Differential (Cancer Center Only)    Standing Status:   Future    Number of Occurrences:   1    Standing Expiration Date:   11/19/2023  . CMP (Cancer Center only)    Standing Status:   Future    Number of Occurrences:   1    Standing Expiration Date:   11/19/2023    All questions were answered. The patient knows to call the clinic with any problems, questions or concerns. We can certainly see the patient much sooner if necessary. This note was electronically signed. Noreene Filbert, NP 11/19/2022

## 2022-11-20 ENCOUNTER — Telehealth: Payer: Self-pay | Admitting: Hematology

## 2022-11-20 NOTE — Telephone Encounter (Signed)
Spoke with patient confirming upcoming appointment  

## 2022-11-21 NOTE — Assessment & Plan Note (Signed)
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  Breast Cancer Survivorship Patient is on Anastrozole with no new symptoms. Mammogram from October 10th was normal. Breasts showed no evidence of malignancy. -Continue Anastrozole as prescribed. -Address issue with pharmacy regarding the quantity of Anastrozole dispensed. -Schedule bone density test with [facility] in the next few months. -Recommended healthy diet and exercise.  Follow-up No new pain or symptoms reported. -Return in six months for lab follow-up with Dr. Blake Divine. -Encourage patient to report any new or unusual symptoms.

## 2022-11-29 ENCOUNTER — Ambulatory Visit: Payer: Medicare PPO

## 2022-12-22 DIAGNOSIS — M8588 Other specified disorders of bone density and structure, other site: Secondary | ICD-10-CM | POA: Diagnosis not present

## 2022-12-22 DIAGNOSIS — R29890 Loss of height: Secondary | ICD-10-CM | POA: Diagnosis not present

## 2022-12-22 DIAGNOSIS — Z853 Personal history of malignant neoplasm of breast: Secondary | ICD-10-CM | POA: Diagnosis not present

## 2022-12-27 ENCOUNTER — Encounter: Payer: Self-pay | Admitting: Hematology

## 2023-03-02 ENCOUNTER — Other Ambulatory Visit: Payer: Self-pay

## 2023-03-02 DIAGNOSIS — Z170 Estrogen receptor positive status [ER+]: Secondary | ICD-10-CM

## 2023-03-02 MED ORDER — ANASTROZOLE 1 MG PO TABS
1.0000 mg | ORAL_TABLET | Freq: Every day | ORAL | 1 refills | Status: DC
Start: 2023-03-02 — End: 2023-10-26

## 2023-04-18 ENCOUNTER — Other Ambulatory Visit: Payer: Self-pay

## 2023-04-18 DIAGNOSIS — Z170 Estrogen receptor positive status [ER+]: Secondary | ICD-10-CM

## 2023-04-19 ENCOUNTER — Inpatient Hospital Stay: Payer: Medicare PPO | Admitting: Hematology

## 2023-04-19 ENCOUNTER — Encounter: Payer: Self-pay | Admitting: Hematology

## 2023-04-19 ENCOUNTER — Inpatient Hospital Stay: Payer: Medicare PPO | Attending: Hematology

## 2023-04-19 VITALS — BP 124/78 | HR 86 | Temp 98.3°F | Resp 18 | Ht 64.0 in | Wt 141.5 lb

## 2023-04-19 DIAGNOSIS — Z923 Personal history of irradiation: Secondary | ICD-10-CM | POA: Diagnosis not present

## 2023-04-19 DIAGNOSIS — D649 Anemia, unspecified: Secondary | ICD-10-CM | POA: Insufficient documentation

## 2023-04-19 DIAGNOSIS — Z780 Asymptomatic menopausal state: Secondary | ICD-10-CM | POA: Diagnosis not present

## 2023-04-19 DIAGNOSIS — C50311 Malignant neoplasm of lower-inner quadrant of right female breast: Secondary | ICD-10-CM

## 2023-04-19 DIAGNOSIS — Z79899 Other long term (current) drug therapy: Secondary | ICD-10-CM | POA: Diagnosis not present

## 2023-04-19 DIAGNOSIS — Z170 Estrogen receptor positive status [ER+]: Secondary | ICD-10-CM | POA: Insufficient documentation

## 2023-04-19 DIAGNOSIS — Z79811 Long term (current) use of aromatase inhibitors: Secondary | ICD-10-CM | POA: Diagnosis not present

## 2023-04-19 DIAGNOSIS — Z1721 Progesterone receptor positive status: Secondary | ICD-10-CM | POA: Diagnosis not present

## 2023-04-19 DIAGNOSIS — M8580 Other specified disorders of bone density and structure, unspecified site: Secondary | ICD-10-CM | POA: Diagnosis not present

## 2023-04-19 DIAGNOSIS — Z1732 Human epidermal growth factor receptor 2 negative status: Secondary | ICD-10-CM | POA: Diagnosis not present

## 2023-04-19 LAB — CBC WITH DIFFERENTIAL (CANCER CENTER ONLY)
Abs Immature Granulocytes: 0.01 10*3/uL (ref 0.00–0.07)
Basophils Absolute: 0 10*3/uL (ref 0.0–0.1)
Basophils Relative: 1 %
Eosinophils Absolute: 0.2 10*3/uL (ref 0.0–0.5)
Eosinophils Relative: 4 %
HCT: 35.3 % — ABNORMAL LOW (ref 36.0–46.0)
Hemoglobin: 12.4 g/dL (ref 12.0–15.0)
Immature Granulocytes: 0 %
Lymphocytes Relative: 28 %
Lymphs Abs: 1.2 10*3/uL (ref 0.7–4.0)
MCH: 32.5 pg (ref 26.0–34.0)
MCHC: 35.1 g/dL (ref 30.0–36.0)
MCV: 92.4 fL (ref 80.0–100.0)
Monocytes Absolute: 0.3 10*3/uL (ref 0.1–1.0)
Monocytes Relative: 7 %
Neutro Abs: 2.6 10*3/uL (ref 1.7–7.7)
Neutrophils Relative %: 60 %
Platelet Count: 276 10*3/uL (ref 150–400)
RBC: 3.82 MIL/uL — ABNORMAL LOW (ref 3.87–5.11)
RDW: 12.3 % (ref 11.5–15.5)
WBC Count: 4.3 10*3/uL (ref 4.0–10.5)
nRBC: 0 % (ref 0.0–0.2)

## 2023-04-19 LAB — CMP (CANCER CENTER ONLY)
ALT: 10 U/L (ref 0–44)
AST: 19 U/L (ref 15–41)
Albumin: 4.6 g/dL (ref 3.5–5.0)
Alkaline Phosphatase: 68 U/L (ref 38–126)
Anion gap: 4 — ABNORMAL LOW (ref 5–15)
BUN: 15 mg/dL (ref 8–23)
CO2: 31 mmol/L (ref 22–32)
Calcium: 9.9 mg/dL (ref 8.9–10.3)
Chloride: 103 mmol/L (ref 98–111)
Creatinine: 0.99 mg/dL (ref 0.44–1.00)
GFR, Estimated: >60 mL/min (ref >60)
Glucose, Bld: 95 mg/dL (ref 70–99)
Potassium: 4.5 mmol/L (ref 3.5–5.1)
Sodium: 138 mmol/L (ref 135–145)
Total Bilirubin: 0.6 mg/dL (ref 0.0–1.2)
Total Protein: 7.1 g/dL (ref 6.5–8.1)

## 2023-04-19 NOTE — Assessment & Plan Note (Signed)
 invasive lobular carcinoma, Stage IA, p(mT1c, N0i+), ER+/PR+/HER2-, Grade 2  -found on screening MM. S/p right lumpectomy on 12/03/20 under Dr. Delane Fear revealed multifocal invasive lobular carcinoma, 0.9 cm and 0.5 cm. -Oncotype RS of 23, low risk -s/p radiation 01/29/21 - 03/09/21 under Dr. Eloise Hake -she started anastrozole in late 03/2021. Plan for 10 years due to lobular histology. She is tolerating well with no noticeable side effects.

## 2023-04-19 NOTE — Progress Notes (Signed)
 [facility]   Telephone:([phone]) [phone] Fax:([phone]) [phone]   Clinic Follow up Note   Patient Care Team: [name], PA-C as PCP - General (Family Medicine) [name], MD as Consulting Physician (Hematology) [name], MD as Consulting Physician (General Surgery) [name], MD as Consulting Physician (Radiation Oncology)  Date of Service:  04/19/2023  CHIEF COMPLAINT: f/u of breast cancer  CURRENT THERAPY:  Adjuvant anastrozole  Oncology History   Malignant neoplasm of lower-inner quadrant of right breast of female, estrogen receptor positive (HCC) invasive lobular carcinoma, Stage IA, p(mT1c, N0i+), ER+/PR+/HER2-, Grade 2  -found on screening MM. S/p right lumpectomy on 12/03/20 under Dr. Dwain Sarna revealed multifocal invasive lobular carcinoma, 0.9 cm and 0.5 cm. -Oncotype RS of 23, low risk -s/p radiation 01/29/21 - 03/09/21 under Dr. Roselind Messier -she started anastrozole in late 03/2021. Plan for 10 years due to lobular histology. She is tolerating well with no noticeable side effects.  Assessment & Plan Breast Cancer Currently on anastrozole with no side effects such as arthralgia. Last mammogram in October 2024 was normal. Reports difficulty in self-examination due to naturally fibrocystic breasts, but no new masses detected on examination. - Continue anastrozole - Schedule mammogram for October 2025  Osteopenia Osteopenia with a 10-year hip fracture risk of 3.2%, considered high risk. December 2024 bone density scan shows right femur T-score of -2.6, with a 10% decrease in left hip density compared to the previous year. Spine is well-managed. Currently taking calcium and vitamin D supplements. Zometa infusion recommended to strengthen bones and potentially decrease breast cancer recurrence in bones. Discussed potential side effect of osteonecrosis of the jaw and need for dental clearance before initiation. - Initiate Zometa infusion every six months for  two years, pending dental clearance - Provide letter for dental clearance - Continue calcium and vitamin D supplementation - Perform lab tests before each Zometa infusion  Anemia Previous anemia resolved with current normal blood counts. On iron supplements.  Foot Stress Fracture Foot stress fracture fully healed with no current issues.  Plan - She is clinically doing well, no concern for recurrence - Continue anastrozole, she is tolerating well well - I recommend Zometa every 6 months, for 2 years.  Plan to start on next visit.  I gave her a dental clearance letter. - Lab and follow-up in 6 months   SUMMARY OF ONCOLOGIC HISTORY: Oncology History Overview Note   Cancer Staging  Malignant neoplasm of lower-inner quadrant of right breast of female, estrogen receptor positive (HCC) Staging form: Breast, AJCC 8th Edition - Clinical stage from 10/16/2020: Stage IA (cT1b, cN0, cM0, G2, ER+, PR+, HER2-) - Signed by [name], MD on 10/29/2020 - Pathologic stage from 12/03/2020: Stage IA (pT1c, pN0(i+), cM0, G2, ER+, PR+, HER2-, Oncotype DX score: 23) - Signed by [name], MD on 03/05/2021     Malignant neoplasm of lower-inner quadrant of right breast of female, estrogen receptor positive (HCC)  10/08/2020 Mammogram   Right Diagnostic MM and Right Breast US  -Three irregular masses in lower-inner right breast:   -8 mm at 5-6 o'clock, 4 mm by [place]   -4 mm at 5 o'clock in anterior depth, 2 mm by [place]   -5 mm at 5 o'clock middle depth, 3 mm by [place] -Stable oval mass in right breast at 4 o'clock is benign -[place] of right axilla demonstrates mildly prominent right axillary lymph nodes but no definite lymphadenopathy.    10/16/2020 Cancer Staging   Staging form: Breast, AJCC 8th Edition -  Clinical stage from 10/16/2020: Stage IA (cT1b, cN0, cM0, G2, ER+, PR+, HER2-) - Signed by [name], MD on 10/29/2020 Stage prefix: Initial diagnosis Histologic grading system: 3 grade system   10/16/2020  Pathology Results   Diagnosis 1. Breast, right, needle core biopsy, 6 o'clock mass - INVASIVE MAMMARY CARCINOMA. SEE NOTE - MAMMARY CARCINOMA IN SITU 2. Breast, right, needle core biopsy, 4 o'clock mass - INVASIVE MAMMARY CARCINOMA. SEE NOTE 3. Breast, right, needle core biopsy, 5 o'clock mass - INVASIVE MAMMARY CARCINOMA. SEE NOTE - MAMMARY CARCINOMA IN SITU Diagnosis Note 1. , 2 and 3. Carcinoma measures 0.5 cm in part 1, 0.4 cm in part 2 and 0.2 cm in part 3; and appears grade 2.  ADDENDUM: 1. ,2 and 3. Immunohistochemical stain for E-cadherin is negative in the tumor cells, consistent with a lobular phenotype.  1. PROGNOSTIC INDICATORS Results: The tumor cells are NEGATIVE for Her2 (1+). Estrogen Receptor: 100%, POSITIVE, STRONG STAINING INTENSITY Progesterone Receptor: 50%, POSITIVE, MODERATE STAINING INTENSITY Proliferation Marker Ki67: 5%   10/24/2020 Initial Diagnosis   Malignant neoplasm of lower-inner quadrant of right breast of female, estrogen receptor positive (HCC)   12/03/2020 Cancer Staging   Staging form: Breast, AJCC 8th Edition - Pathologic stage from 12/03/2020: Stage IA (pT1c, pN0(i+), cM0, G2, ER+, PR+, HER2-, Oncotype DX score: 23) - Signed by [name], MD on 03/05/2021 Stage prefix: Initial diagnosis Multigene prognostic tests performed: Oncotype DX Recurrence score range: Greater than or equal to 11 Histologic grading system: 3 grade system   12/03/2020 Definitive Surgery   FINAL MICROSCOPIC DIAGNOSIS:   A. 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 BREAST, RIGHT MEDIAL MARGIN, EXCISION:  -  Focal residual invasive and in situ lobular carcinoma  -  Margins uninvolved by carcinoma (0.6 cm)   C. BREAST, RIGHT  INFERIOR MARGIN, EXCISION:  -  Focal residual lobular carcinoma in situ  -  Margins uninvolved by carcinoma   D. BREAST, RIGHT LATERAL MARGIN, EXCISION:  -  No residual carcinoma identified   E. BREAST, RIGHT SUPERIOR MARGIN, EXCISION:  -  No residual carcinoma identified   F. BREAST, RIGHT POSTERIOR MARGIN, EXCISION:  -  No residual carcinoma identified   G. LYMPH NODE, RIGHT AXILLARY, SENTINEL, EXCISION:  -  No carcinoma identified in one lymph node (0/1)  -  See comment   H. LYMPH NODE, RIGHT AXILLARY, SENTINEL, EXCISION:  -  No carcinoma identified in one lymph node (0/1)  -  See comment   I. LYMPH NODE, RIGHT AXILLARY, SENTINEL, EXCISION:  -  No carcinoma identified in one lymph node (0/1)  -  See comment   J. LYMPH NODE, RIGHT AXILLARY, SENTINEL, EXCISION:  -  Isolated tumor cells present in one lymph node (0i/1)  -  See comment    12/03/2020 Oncotype testing   Oncotype DX was obtained on the final surgical sample and the recurrence score of 23 predicts a risk of recurrence outside the breast over the next 9 years of 9%, if the patient's only systemic therapy is an antiestrogen for 5 years.  It also predicts no benefit from chemotherapy.    01/29/2021 - 03/09/2021 Anti-estrogen oral therapy   01/29/2021 through 03/09/2021 Site Technique Total  Dose (Gy) Dose per Fx (Gy) Completed Fx Beam Energies  Breast, Right: Breast_R 3D 50.4/50.4 1.8 28/28 6X     04/2021 -  Anti-estrogen oral therapy   Anastrozole daily      Discussed the use of AI scribe software for clinical note transcription with the patient, who gave verbal consent to proceed.  History of Present Illness Ms. [name], a breast cancer survivor, presents for a routine follow-up. She reports no new health issues and denies any side effects from her current medication, Anastrozole. She denies experiencing any joint pain, chest pain, stomach pain, or back pain. Her energy levels and appetite are reported as  normal.  She had a foot stress fracture last year, which she attributes to a twisting injury. She wore a boot for about six weeks and reports that her foot has since recovered and healed well.  She also has osteopenia, for which she is taking calcium and vitamin D supplements. She reports no hip pain or any other symptoms related to osteopenia.  She had a mammogram last October, which was normal. She reports that her breasts are naturally lumpy, which makes self-examinations challenging for her.  She also had a bone density scan last December. The results showed a slight increase in osteopenia, with her left hip being worse than the previous year. Her spine, however, was reported as good.     All other systems were reviewed with the patient and are negative.  MEDICAL HISTORY:  Past Medical History:  Diagnosis Date   Anxiety    takes Xanax daily    Arthritis    Breast cancer (HCC)    Depression    takes Pristiq and Wellbutrin daily   GERD (gastroesophageal reflux disease)    History of radiation therapy    right breast  01/29/2021-03/09/2021  Dr Retta Caster   Joint pain    Joint swelling    Nocturia    Seasonal allergies    Takes Zyrtec nightly along with Nasal Spray   Vitamin D deficiency    new script for Vit D to start 08/22/15    SURGICAL HISTORY: Past Surgical History:  Procedure Laterality Date   blephorplasty Bilateral    BREAST LUMPECTOMY WITH RADIOACTIVE SEED AND SENTINEL LYMPH NODE BIOPSY Right 12/03/2020   Procedure: RIGHT BREAST LUMPECTOMY WITH RADIOACTIVE SEEDS X3 AND AXILLARY SENTINEL LYMPH NODE BIOPSY;  Surgeon: Enid Harry, MD;  Location: [facility];  Service: General;  Laterality: Right;   BREAST RECONSTRUCTION Right 12/09/2020   Procedure: RIGHT ONCOPLASTIC BREAST RECONSTRUCTION;  Surgeon: Alger Infield, MD;  Location: [facility];  Service: Plastics;  Laterality: Right;   BREAST REDUCTION SURGERY Left 12/09/2020    Procedure: LEFT MAMMARY REDUCTION  (BREAST);  Surgeon: Alger Infield, MD;  Location: [facility];  Service: Plastics;  Laterality: Left;   COLONOSCOPY     left leg surgery     rod   TOTAL KNEE ARTHROPLASTY Right 09/01/2015   TOTAL KNEE ARTHROPLASTY Right 09/01/2015   Procedure: TOTAL KNEE ARTHROPLASTY;  Surgeon: Wendolyn Hamburger, MD;  Location: [facility];  Service: Orthopedics;  Laterality: Right;    I have reviewed the social history and family history with the patient and they are unchanged from previous note.  ALLERGIES:  is allergic to codeine and lisinopril.  MEDICATIONS:  Current Outpatient Medications  Medication Sig Dispense Refill   ALPRAZolam (XANAX) 0.5 MG tablet Take 0.5 mg by mouth 3 (three) times daily as needed for anxiety (pt reports taking  TID).     anastrozole (ARIMIDEX) 1 MG tablet Take 1 tablet (1 mg total) by mouth daily. 90 tablet 1   azelastine (ASTELIN) 0.1 % nasal spray Place 2 sprays into both nostrils 2 (two) times daily. Use in each nostril as directed     buPROPion (WELLBUTRIN XL) 300 MG 24 hr tablet Take 1 tablet by mouth daily.     calcium carbonate (OS-CAL - DOSED IN MG OF ELEMENTAL CALCIUM) 1250 (500 Ca) MG tablet Take by mouth.     cetirizine (ZYRTEC) 10 MG tablet Take 10 mg by mouth at bedtime.      Cholecalciferol 50 MCG (2000 UT) TABS 1 tablet     desvenlafaxine (PRISTIQ) 50 MG 24 hr tablet Take 50 mg by mouth daily.      lansoprazole (PREVACID) 15 MG capsule Take 15 mg by mouth daily. At 6AM     Melatonin 10 MG TABS Take 10 mg by mouth at bedtime.     telmisartan (MICARDIS) 80 MG tablet Take 80 mg by mouth daily.     zolpidem (AMBIEN) 10 MG tablet Take 10 mg by mouth at bedtime.     rosuvastatin (CRESTOR) 5 MG tablet TAKE 1 TABLET (5 MG TOTAL) BY MOUTH DAILY. 90 tablet 3   No current facility-administered medications for this visit.    PHYSICAL EXAMINATION: ECOG PERFORMANCE STATUS: 0 - Asymptomatic  Vitals:   04/19/23 1349  BP:  124/78  Pulse: 86  Resp: 18  Temp: 98.3 F (36.8 C)  SpO2: 99%   Wt Readings from Last 3 Encounters:  04/19/23 141 lb 8 oz (64.2 kg)  11/19/22 136 lb 9.6 oz (62 kg)  07/30/22 138 lb 3.2 oz (62.7 kg)     GENERAL:alert, no distress and comfortable SKIN: skin color, texture, turgor are normal, no rashes or significant lesions EYES: normal, Conjunctiva are pink and non-injected, sclera clear NECK: supple, thyroid normal size, non-tender, without nodularity LYMPH:  no palpable lymphadenopathy in the cervical, axillary  LUNGS: clear to auscultation and percussion with normal breathing effort HEART: regular rate & rhythm and no murmurs and no lower extremity edema ABDOMEN:abdomen soft, non-tender and normal bowel sounds Musculoskeletal:no cyanosis of digits and no clubbing  NEURO: alert & oriented x 3 with fluent speech, no focal motor/sensory deficits Breasts: Breast inspection showed them to be symmetrical with no nipple discharge. Palpation of the breasts and axilla revealed no obvious mass that I could appreciate.  Physical Exam   LABORATORY DATA:  I have reviewed the data as listed    Latest Ref Rng & Units 04/19/2023    1:31 PM 11/19/2022    1:32 PM 03/05/2022   11:41 AM  CBC  WBC 4.0 - 10.5 K/uL 4.3  5.7  5.3   Hemoglobin 12.0 - 15.0 g/dL 16.1  09.6  04.5   Hematocrit 36.0 - 46.0 % 35.3  36.5  34.7   Platelets 150 - 400 K/uL 276  335  274         Latest Ref Rng & Units 04/19/2023    1:31 PM 11/19/2022    1:32 PM 03/05/2022   11:41 AM  CMP  Glucose 70 - 99 mg/dL 95  409  811   BUN 8 - 23 mg/dL 15  15  14    Creatinine 0.44 - 1.00 mg/dL 9.14  7.82  9.56   Sodium 135 - 145 mmol/L 138  136  135   Potassium 3.5 - 5.1 mmol/L 4.5  4.1  4.0  Chloride 98 - 111 mmol/L 103  100  102   CO2 22 - 32 mmol/L 31  31  28    Calcium 8.9 - 10.3 mg/dL 9.9  69.6  9.3   Total Protein 6.5 - 8.1 g/dL 7.1  7.5  7.1   Total Bilirubin 0.0 - 1.2 mg/dL 0.6  0.6  0.5   Alkaline Phos 38 - 126 U/L  68  84  78   AST 15 - 41 U/L 19  16  16    ALT 0 - 44 U/L 10  13  11        RADIOGRAPHIC STUDIES: I have personally reviewed the radiological images as listed and agreed with the findings in the report. No results found.    No orders of the defined types were placed in this encounter.  All questions were answered. The patient knows to call the clinic with any problems, questions or concerns. No barriers to learning was detected. The total time spent in the appointment was 25 minutes.     Sonja Wilton, MD 04/19/2023

## 2023-04-26 ENCOUNTER — Ambulatory Visit

## 2023-04-27 DIAGNOSIS — N952 Postmenopausal atrophic vaginitis: Secondary | ICD-10-CM | POA: Diagnosis not present

## 2023-04-27 DIAGNOSIS — Z853 Personal history of malignant neoplasm of breast: Secondary | ICD-10-CM | POA: Diagnosis not present

## 2023-04-27 DIAGNOSIS — Z8742 Personal history of other diseases of the female genital tract: Secondary | ICD-10-CM | POA: Diagnosis not present

## 2023-04-27 DIAGNOSIS — M8580 Other specified disorders of bone density and structure, unspecified site: Secondary | ICD-10-CM | POA: Diagnosis not present

## 2023-04-27 DIAGNOSIS — Z01419 Encounter for gynecological examination (general) (routine) without abnormal findings: Secondary | ICD-10-CM | POA: Diagnosis not present

## 2023-05-02 ENCOUNTER — Ambulatory Visit: Payer: Medicare PPO | Attending: General Surgery

## 2023-05-02 VITALS — Wt 137.2 lb

## 2023-05-02 DIAGNOSIS — Z483 Aftercare following surgery for neoplasm: Secondary | ICD-10-CM

## 2023-05-02 NOTE — Therapy (Signed)
 OUTPATIENT PHYSICAL THERAPY SOZO SCREENING NOTE   Patient Name: [name] MRN: [id number] DOB:06-05-1950, 73 y.o., female Today's Date: 05/02/2023  PCP: Darnelle Elders, PA-C REFERRING PROVIDER: Enid Harry, MD   PT End of Session - 05/02/23 1500     Visit Number 2   # unchanged due to screen only   PT Start Time 1457    PT Stop Time 1503    PT Time Calculation (min) 6 min    Activity Tolerance Patient tolerated treatment well    Behavior During Therapy [facility] for tasks assessed/performed             Past Medical History:  Diagnosis Date   Anxiety    takes Xanax  daily    Arthritis    Breast cancer (HCC)    Depression    takes Pristiq  and Wellbutrin  daily   GERD (gastroesophageal reflux disease)    History of radiation therapy    right breast  01/29/2021-03/09/2021  Dr Retta Caster   Joint pain    Joint swelling    Nocturia    Seasonal allergies    Takes Zyrtec  nightly along with Nasal Spray   Vitamin D deficiency    new script for Vit D to start 08/22/15   Past Surgical History:  Procedure Laterality Date   blephorplasty Bilateral    BREAST LUMPECTOMY WITH RADIOACTIVE SEED AND SENTINEL LYMPH NODE BIOPSY Right 12/03/2020   Procedure: RIGHT BREAST LUMPECTOMY WITH RADIOACTIVE SEEDS X3 AND AXILLARY SENTINEL LYMPH NODE BIOPSY;  Surgeon: Enid Harry, MD;  Location: [facility];  Service: General;  Laterality: Right;   BREAST RECONSTRUCTION Right 12/09/2020   Procedure: RIGHT ONCOPLASTIC BREAST RECONSTRUCTION;  Surgeon: Alger Infield, MD;  Location: [facility];  Service: Plastics;  Laterality: Right;   BREAST REDUCTION SURGERY Left 12/09/2020   Procedure: LEFT MAMMARY REDUCTION  (BREAST);  Surgeon: Alger Infield, MD;  [facility];  Service: Plastics;  Laterality: Left;   COLONOSCOPY     left leg surgery     rod   TOTAL KNEE ARTHROPLASTY Right 09/01/2015   TOTAL KNEE ARTHROPLASTY Right 09/01/2015    Procedure: TOTAL KNEE ARTHROPLASTY;  Surgeon: Wendolyn Hamburger, MD;  Location: [facility];  Service: Orthopedics;  Laterality: Right;   Patient Active Problem List   Diagnosis Date Noted   Osteopenia after menopause 04/19/2023   Dyslipidemia, goal LDL below 70 07/30/2022   Malignant neoplasm of lower-inner quadrant of right breast of female, estrogen receptor positive (HCC) 10/24/2020   Anxiety 10/28/2016   Allergic rhinitis 10/28/2016   HTN (hypertension) 10/28/2016   Constipation 10/28/2016   Insomnia 10/28/2016   Vitamin D deficiency 10/28/2016   Hyponatremia 10/26/2016   Primary osteoarthritis of right knee 08/28/2015    REFERRING DIAG: right breast cancer at risk for lymphedema  THERAPY DIAG: Aftercare following surgery for neoplasm  PERTINENT HISTORY: Patient was diagnosed on 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 She underwent a right lumpectomy and sentinel node biopsy (4 nodes all negative except one with isolated tumor cells) on 12/03/2020. She then underwent a bilateral breast reduction on 12/09/2020.. It is ER/PR positive and HER2 negative with a Ki67 of 5%.   PRECAUTIONS: right UE Lymphedema risk, None  SUBJECTIVE: Pt returns for her 3 month L-Dex screen.   PAIN:  Are you having pain? No  SOZO SCREENING: Patient was assessed today using the SOZO machine to determine the lymphedema index score. This was compared to her baseline score. It was determined  that she is within the recommended range when compared to her baseline and no further action is needed at this time. She will continue SOZO screenings. These are done every 3 months for 2 years post operatively followed by every 6 months for 2 years, and then annually.   L-DEX FLOWSHEETS - 05/02/23 1500       L-DEX LYMPHEDEMA SCREENING   Measurement Type Unilateral    L-DEX MEASUREMENT EXTREMITY Upper Extremity    POSITION  Standing    DOMINANT SIDE Right    At Risk Side Right    BASELINE SCORE  (UNILATERAL) 0.7    L-DEX SCORE (UNILATERAL) -6.2    VALUE CHANGE (UNILAT) -6.9              Denyce Flank, PTA 05/02/2023, 3:03 PM

## 2023-05-19 ENCOUNTER — Other Ambulatory Visit: Payer: Medicare PPO

## 2023-05-19 ENCOUNTER — Ambulatory Visit: Payer: Medicare PPO | Admitting: Hematology

## 2023-06-13 DIAGNOSIS — Z1283 Encounter for screening for malignant neoplasm of skin: Secondary | ICD-10-CM | POA: Diagnosis not present

## 2023-06-13 DIAGNOSIS — D225 Melanocytic nevi of trunk: Secondary | ICD-10-CM | POA: Diagnosis not present

## 2023-07-27 ENCOUNTER — Other Ambulatory Visit: Payer: Self-pay

## 2023-07-28 NOTE — Progress Notes (Signed)
 Dental Clearance received and was dropped off to Scheduling Dept and given to Dr. Demetra Team on 07/27/2023.  Dental Clearance sent to HIM to be scanned to pt's chart on 07/28/2023.  Pt is scheduled to start Zometa in October 2025.

## 2023-07-29 ENCOUNTER — Telehealth: Payer: Self-pay | Admitting: Hematology

## 2023-07-29 NOTE — Telephone Encounter (Signed)
 Called to remind patient of her 10/22 appointments for labs, see Dr.Feng and a zometa INFUSION. Left VM with appointment details for the patient.

## 2023-08-10 ENCOUNTER — Other Ambulatory Visit: Payer: Self-pay

## 2023-08-24 DIAGNOSIS — Z6823 Body mass index (BMI) 23.0-23.9, adult: Secondary | ICD-10-CM | POA: Diagnosis not present

## 2023-08-24 DIAGNOSIS — H00013 Hordeolum externum right eye, unspecified eyelid: Secondary | ICD-10-CM | POA: Diagnosis not present

## 2023-09-14 ENCOUNTER — Other Ambulatory Visit: Payer: Self-pay | Admitting: Internal Medicine

## 2023-10-19 DIAGNOSIS — Z853 Personal history of malignant neoplasm of breast: Secondary | ICD-10-CM | POA: Diagnosis not present

## 2023-10-19 DIAGNOSIS — R928 Other abnormal and inconclusive findings on diagnostic imaging of breast: Secondary | ICD-10-CM | POA: Diagnosis not present

## 2023-10-21 ENCOUNTER — Encounter: Payer: Self-pay | Admitting: Hematology

## 2023-10-25 ENCOUNTER — Other Ambulatory Visit: Payer: Self-pay

## 2023-10-25 DIAGNOSIS — Z170 Estrogen receptor positive status [ER+]: Secondary | ICD-10-CM

## 2023-10-26 ENCOUNTER — Other Ambulatory Visit

## 2023-10-26 ENCOUNTER — Ambulatory Visit: Admitting: Hematology

## 2023-10-26 ENCOUNTER — Inpatient Hospital Stay: Attending: Hematology

## 2023-10-26 ENCOUNTER — Inpatient Hospital Stay

## 2023-10-26 ENCOUNTER — Inpatient Hospital Stay: Admitting: Hematology

## 2023-10-26 VITALS — BP 120/84 | HR 95 | Temp 98.2°F | Resp 17 | Ht 63.5 in | Wt 143.8 lb

## 2023-10-26 DIAGNOSIS — N6314 Unspecified lump in the right breast, lower inner quadrant: Secondary | ICD-10-CM | POA: Insufficient documentation

## 2023-10-26 DIAGNOSIS — Z79811 Long term (current) use of aromatase inhibitors: Secondary | ICD-10-CM | POA: Insufficient documentation

## 2023-10-26 DIAGNOSIS — M818 Other osteoporosis without current pathological fracture: Secondary | ICD-10-CM | POA: Insufficient documentation

## 2023-10-26 DIAGNOSIS — Z170 Estrogen receptor positive status [ER+]: Secondary | ICD-10-CM | POA: Insufficient documentation

## 2023-10-26 DIAGNOSIS — Z79899 Other long term (current) drug therapy: Secondary | ICD-10-CM | POA: Diagnosis not present

## 2023-10-26 DIAGNOSIS — Z7983 Long term (current) use of bisphosphonates: Secondary | ICD-10-CM | POA: Diagnosis not present

## 2023-10-26 DIAGNOSIS — C50311 Malignant neoplasm of lower-inner quadrant of right female breast: Secondary | ICD-10-CM | POA: Diagnosis not present

## 2023-10-26 DIAGNOSIS — Z923 Personal history of irradiation: Secondary | ICD-10-CM | POA: Insufficient documentation

## 2023-10-26 DIAGNOSIS — Z888 Allergy status to other drugs, medicaments and biological substances status: Secondary | ICD-10-CM | POA: Insufficient documentation

## 2023-10-26 DIAGNOSIS — Z1732 Human epidermal growth factor receptor 2 negative status: Secondary | ICD-10-CM | POA: Diagnosis not present

## 2023-10-26 DIAGNOSIS — Z885 Allergy status to narcotic agent status: Secondary | ICD-10-CM | POA: Insufficient documentation

## 2023-10-26 DIAGNOSIS — M8580 Other specified disorders of bone density and structure, unspecified site: Secondary | ICD-10-CM

## 2023-10-26 LAB — CMP (CANCER CENTER ONLY)
ALT: 10 U/L (ref 0–44)
AST: 15 U/L (ref 15–41)
Albumin: 4.3 g/dL (ref 3.5–5.0)
Alkaline Phosphatase: 93 U/L (ref 38–126)
Anion gap: 4 — ABNORMAL LOW (ref 5–15)
BUN: 16 mg/dL (ref 8–23)
CO2: 30 mmol/L (ref 22–32)
Calcium: 10 mg/dL (ref 8.9–10.3)
Chloride: 106 mmol/L (ref 98–111)
Creatinine: 0.93 mg/dL (ref 0.44–1.00)
GFR, Estimated: >60 mL/min
Glucose, Bld: 99 mg/dL (ref 70–99)
Potassium: 4.2 mmol/L (ref 3.5–5.1)
Sodium: 140 mmol/L (ref 135–145)
Total Bilirubin: 0.3 mg/dL (ref 0.0–1.2)
Total Protein: 6.6 g/dL (ref 6.5–8.1)

## 2023-10-26 LAB — CBC WITH DIFFERENTIAL (CANCER CENTER ONLY)
Abs Immature Granulocytes: 0.02 10*3/uL (ref 0.00–0.07)
Basophils Absolute: 0.1 10*3/uL (ref 0.0–0.1)
Basophils Relative: 1 %
Eosinophils Absolute: 0.3 10*3/uL (ref 0.0–0.5)
Eosinophils Relative: 5 %
HCT: 34.5 % — ABNORMAL LOW (ref 36.0–46.0)
Hemoglobin: 12 g/dL (ref 12.0–15.0)
Immature Granulocytes: 0 %
Lymphocytes Relative: 27 %
Lymphs Abs: 1.5 10*3/uL (ref 0.7–4.0)
MCH: 32.3 pg (ref 26.0–34.0)
MCHC: 34.8 g/dL (ref 30.0–36.0)
MCV: 93 fL (ref 80.0–100.0)
Monocytes Absolute: 0.5 10*3/uL (ref 0.1–1.0)
Monocytes Relative: 9 %
Neutro Abs: 3.3 10*3/uL (ref 1.7–7.7)
Neutrophils Relative %: 58 %
Platelet Count: 264 10*3/uL (ref 150–400)
RBC: 3.71 MIL/uL — ABNORMAL LOW (ref 3.87–5.11)
RDW: 12.6 % (ref 11.5–15.5)
WBC Count: 5.7 10*3/uL (ref 4.0–10.5)
nRBC: 0 % (ref 0.0–0.2)

## 2023-10-26 MED ORDER — ANASTROZOLE 1 MG PO TABS: 1.0000 mg | ORAL_TABLET | Freq: Every day | ORAL | 3 refills | Status: AC

## 2023-10-26 MED ORDER — ZOLEDRONIC ACID 4 MG/100ML IV SOLN
4.0000 mg | Freq: Once | INTRAVENOUS | Status: AC
  Administered 2023-10-26: 4 mg via INTRAVENOUS
  Filled 2023-10-26: qty 100

## 2023-10-26 MED ORDER — SODIUM CHLORIDE 0.9 % IV SOLN: INTRAVENOUS | Status: DC

## 2023-10-26 NOTE — Assessment & Plan Note (Signed)
 invasive lobular carcinoma, Stage IA, p(mT1c, N0i+), ER+/PR+/HER2-, Grade 2  -found on screening MM. S/p right lumpectomy on 12/03/20 under Dr. Delane Fear revealed multifocal invasive lobular carcinoma, 0.9 cm and 0.5 cm. -Oncotype RS of 23, low risk -s/p radiation 01/29/21 - 03/09/21 under Dr. Eloise Hake -she started anastrozole in late 03/2021. Plan for 10 years due to lobular histology. She is tolerating well with no noticeable side effects.

## 2023-10-26 NOTE — Progress Notes (Signed)
 [facility]   [phone] [phone]) [phone]   Clinic Follow up Note   Patient Care Team: [name], PA-C as PCP - General (Family Medicine) [name], MD as Consulting Physician (Hematology) [name], MD as Consulting Physician (General Surgery) [name], MD as Consulting Physician (Radiation Oncology)  Date of Service:  10/26/2023  CHIEF COMPLAINT: f/u of breast  CURRENT THERAPY:  Adjuvant anastrozole   Oncology History   Malignant neoplasm of lower-inner quadrant of right breast of female, estrogen receptor positive (HCC) invasive lobular carcinoma, Stage IA, p(mT1c, N0i+), ER+/PR+/HER2-, Grade 2  -found on screening MM. S/p right lumpectomy on 12/03/20 under Dr. Ebbie revealed multifocal invasive lobular carcinoma, 0.9 cm and 0.5 cm. -Oncotype RS of 23, low risk -s/p radiation 01/29/21 - 03/09/21 under Dr. Shannon -she started anastrozole  in late 03/2021. Plan for 10 years due to lobular histology. She is tolerating well with no noticeable side effects.  Assessment & Plan Right breast cancer, post-surgical, on adjuvant therapy Right breast cancer, status post-surgery, currently on adjuvant therapy with anastrozole . She reports no significant issues with anastrozole , though she notes changes in her body, such as dryness, which are manageable. She uses Vagisil for comfort as advised by her gynecologist. She has completed three diagnostic mammograms since surgery and is now transitioning to regular screening mammograms. She is comfortable with this plan and understands the insurance coverage implications. - Refill anastrozole  prescription at [facility] on [address]. - Continue regular screening mammograms as per insurance coverage. - Use Vagisil for dryness as needed.  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 She  is scheduled for Zometa infusions every six months for four treatments. She has been advised to avoid invasive dental procedures one month before and after the infusion to prevent complications. She is aware of potential side effects such as fatigue, bone pain, and flu-like symptoms, though these are uncommon. She is compliant with calcium  and vitamin D supplementation to support bone health. - Administer Zometa infusion every six months for four treatments. - Avoid invasive dental procedures one month before and after Zometa infusion. - Continue calcium  and vitamin D supplementation.  Plan - She is clinically doing well, lab reviewed, will continue anastrozole  - Will proceed to first dose Zometa today, and continue every 6 months for a total of 4 doses - Follow-up in 6 months with lab and Zometa   SUMMARY OF ONCOLOGIC HISTORY: Oncology History Overview Note   Cancer Staging  Malignant neoplasm of lower-inner quadrant of right breast of female, estrogen receptor positive (HCC) Staging form: Breast, AJCC 8th Edition - Clinical stage from 10/16/2020: Stage IA (cT1b, cN0, cM0, G2, ER+, PR+, HER2-) - Signed by [name], MD on 10/29/2020 - Pathologic stage from 12/03/2020: Stage IA (pT1c, pN0(i+), cM0, G2, ER+, PR+, HER2-, Oncotype DX score: 23) - Signed by [name], MD on 03/05/2021     Malignant neoplasm of lower-inner quadrant of right breast of female, estrogen receptor positive (HCC)  10/08/2020 Mammogram   Right Diagnostic MM and Right Breast US   -Three irregular masses in lower-inner right breast:   -8 mm at 5-6 o'clock, 4 mm by US    -4 mm at 5 o'clock in anterior depth, 2 mm by US    -5 mm at 5 o'clock middle depth, 3 mm by US  -Stable oval mass in right breast at 4 o'clock is benign -US  of right axilla demonstrates mildly prominent right axillary lymph nodes  but no definite lymphadenopathy.    10/16/2020 Cancer Staging   Staging form: Breast, AJCC 8th Edition - Clinical stage from  10/16/2020: Stage IA (cT1b, cN0, cM0, G2, ER+, PR+, HER2-) - Signed by [name], MD on 10/29/2020 Stage prefix: Initial diagnosis Histologic grading system: 3 grade system   10/16/2020 Pathology Results   Diagnosis 1. Breast, right, needle core biopsy, 6 o'clock mass - INVASIVE MAMMARY CARCINOMA. SEE NOTE - MAMMARY CARCINOMA IN SITU 2. Breast, right, needle core biopsy, 4 o'clock mass - INVASIVE MAMMARY CARCINOMA. SEE NOTE 3. Breast, right, needle core biopsy, 5 o'clock mass - INVASIVE MAMMARY CARCINOMA. SEE NOTE - MAMMARY CARCINOMA IN SITU Diagnosis Note 1. , 2 and 3. Carcinoma measures 0.5 cm in part 1, 0.4 cm in part 2 and 0.2 cm in part 3; and appears grade 2.  ADDENDUM: 1. ,2 and 3. Immunohistochemical stain for E-cadherin is negative in the tumor cells, consistent with a lobular phenotype.  1. PROGNOSTIC INDICATORS Results: The tumor cells are NEGATIVE for Her2 (1+). Estrogen Receptor: 100%, POSITIVE, STRONG STAINING INTENSITY Progesterone Receptor: 50%, POSITIVE, MODERATE STAINING INTENSITY Proliferation Marker Ki67: 5%   10/24/2020 Initial Diagnosis   Malignant neoplasm of lower-inner quadrant of right breast of female, estrogen receptor positive (HCC)   12/03/2020 Cancer Staging   Staging form: Breast, AJCC 8th Edition - Pathologic stage from 12/03/2020: Stage IA (pT1c, pN0(i+), cM0, G2, ER+, PR+, HER2-, Oncotype DX score: 23) - Signed by [name], MD on 03/05/2021 Stage prefix: Initial diagnosis Multigene prognostic tests performed: Oncotype DX Recurrence score range: Greater than or equal to 11 Histologic grading system: 3 grade system   12/03/2020 Definitive Surgery   FINAL MICROSCOPIC DIAGNOSIS:   A. 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 BREAST, RIGHT MEDIAL MARGIN, EXCISION:  -  Focal residual invasive and in situ lobular carcinoma  -  Margins uninvolved by carcinoma (0.6 cm)   C. BREAST, RIGHT INFERIOR MARGIN, EXCISION:  -  Focal residual lobular carcinoma in situ  -  Margins uninvolved by carcinoma   D. BREAST, RIGHT LATERAL MARGIN, EXCISION:  -  No residual carcinoma identified   E. BREAST, RIGHT SUPERIOR MARGIN, EXCISION:  -  No residual carcinoma identified   F. BREAST, RIGHT POSTERIOR MARGIN, EXCISION:  -  No residual carcinoma identified   G. LYMPH NODE, RIGHT AXILLARY, SENTINEL, EXCISION:  -  No carcinoma identified in one lymph node (0/1)  -  See comment   H. LYMPH NODE, RIGHT AXILLARY, SENTINEL, EXCISION:  -  No carcinoma identified in one lymph node (0/1)  -  See comment   I. LYMPH NODE, RIGHT AXILLARY, SENTINEL, EXCISION:  -  No carcinoma identified in one lymph node (0/1)  -  See comment   J. LYMPH NODE, RIGHT AXILLARY, SENTINEL, EXCISION:  -  Isolated tumor cells present in one lymph node (0i/1)  -  See comment    12/03/2020 Oncotype testing   Oncotype DX was obtained on the final surgical sample and the recurrence score of 23 predicts a risk of recurrence outside the breast over the next 9 years of 9%, if the patient's only systemic therapy is an antiestrogen for 5 years.  It also predicts no benefit  from chemotherapy.    01/29/2021 - 03/09/2021 Anti-estrogen oral therapy   01/29/2021 through 03/09/2021 Site Technique Total Dose (Gy) Dose per Fx (Gy) Completed Fx Beam Energies  Breast, Right: Breast_R 3D 50.4/50.4 1.8 28/28 6X     04/2021 -  Anti-estrogen oral therapy   Anastrozole  daily      Discussed the use of AI scribe software for clinical note transcription with the patient, who gave verbal consent to proceed.  History of Present Illness [name] is a 73 year old female with breast cancer who presents for follow-up.  Diagnosed with breast cancer in  2022, she has undergone surgery and had three diagnostic mammograms in 2023, 2024, and 2025. She is considering whether to continue with diagnostic mammograms or switch to regular screening mammograms, as both are covered by her insurance.  She is on anastrozole , which causes her body to feel 'drier', managed with Vagisil without significant issues. She is scheduled for Zometa infusions every six months and avoids invasive dental procedures one month before and after the infusion. She has no current dental issues, but a tooth with a crown may need attention if the crown comes off again.     All other systems were reviewed with the patient and are negative.  MEDICAL HISTORY:  Past Medical History:  Diagnosis Date   Anxiety    takes Xanax  daily    Arthritis    Breast cancer (HCC)    Depression    takes Pristiq  and Wellbutrin  daily   GERD (gastroesophageal reflux disease)    History of radiation therapy    right breast  01/29/2021-03/09/2021  Dr Lynwood Nasuti   Joint pain    Joint swelling    Nocturia    Seasonal allergies    Takes Zyrtec  nightly along with Nasal Spray   Vitamin D deficiency    new script for Vit D to start 08/22/15    SURGICAL HISTORY: Past Surgical History:  Procedure Laterality Date   blephorplasty Bilateral    BREAST LUMPECTOMY WITH RADIOACTIVE SEED AND SENTINEL LYMPH NODE BIOPSY Right 12/03/2020   Procedure: RIGHT BREAST LUMPECTOMY WITH RADIOACTIVE SEEDS X3 AND AXILLARY SENTINEL LYMPH NODE BIOPSY;  Surgeon: [name], MD;  Location: [facility];  Service: General;  Laterality: Right;   BREAST RECONSTRUCTION Right 12/09/2020   Procedure: RIGHT ONCOPLASTIC BREAST RECONSTRUCTION;  Surgeon: Arelia Filippo, MD;  Location: [facility];  Service: Plastics;  Laterality: Right;   BREAST REDUCTION SURGERY Left 12/09/2020   Procedure: LEFT MAMMARY REDUCTION  (BREAST);  Surgeon: Arelia Filippo, MD;  Location: [facility];   Service: Plastics;  Laterality: Left;   COLONOSCOPY     left leg surgery     rod   TOTAL KNEE ARTHROPLASTY Right 09/01/2015   TOTAL KNEE ARTHROPLASTY Right 09/01/2015   Procedure: TOTAL KNEE ARTHROPLASTY;  Surgeon: Dempsey Sensor, MD;  Location: [facility];  Service: Orthopedics;  Laterality: Right;    I have reviewed the social history and family history with the patient and they are unchanged from previous note.  ALLERGIES:  is allergic to codeine and lisinopril.  MEDICATIONS:  Current Outpatient Medications  Medication Sig Dispense Refill   ALPRAZolam  (XANAX ) 0.5 MG tablet Take 0.5 mg by mouth 3 (three) times daily as needed for anxiety (pt reports taking TID).     azelastine  (ASTELIN ) 0.1 % nasal spray Place 2 sprays into both nostrils 2 (two) times daily. Use in each nostril as directed     buPROPion  (WELLBUTRIN   XL) 300 MG 24 hr tablet Take 1 tablet by mouth daily.     calcium  carbonate (OS-CAL - DOSED IN MG OF ELEMENTAL CALCIUM ) 1250 (500 Ca) MG tablet Take by mouth.     cetirizine  (ZYRTEC ) 10 MG tablet Take 10 mg by mouth at bedtime.      Cholecalciferol 50 MCG (2000 UT) TABS 1 tablet     desvenlafaxine  (PRISTIQ ) 50 MG 24 hr tablet Take 50 mg by mouth daily.      lansoprazole (PREVACID) 15 MG capsule Take 15 mg by mouth daily. At 6AM     Melatonin 10 MG TABS Take 10 mg by mouth at bedtime.     rosuvastatin  (CRESTOR ) 5 MG tablet TAKE 1 TABLET (5 MG TOTAL) BY MOUTH DAILY. 90 tablet 3   telmisartan (MICARDIS) 80 MG tablet Take 80 mg by mouth daily.     zolpidem  (AMBIEN ) 10 MG tablet Take 10 mg by mouth at bedtime.     anastrozole  (ARIMIDEX ) 1 MG tablet Take 1 tablet (1 mg total) by mouth daily. 90 tablet 3   No current facility-administered medications for this visit.   Facility-Administered Medications Ordered in Other Visits  Medication Dose Route Frequency Provider Last Rate Last Admin   0.9 %  sodium chloride  infusion   Intravenous Continuous [name], MD   Stopped at 10/26/23 1422     PHYSICAL EXAMINATION: ECOG PERFORMANCE STATUS: 0 - Asymptomatic  Vitals:   10/26/23 1302  BP: 120/84  Pulse: 95  Resp: 17  Temp: 98.2 F (36.8 C)  SpO2: 99%   Wt Readings from Last 3 Encounters:  10/26/23 143 lb 12.8 oz (65.2 kg)  05/02/23 137 lb 4 oz (62.3 kg)  04/19/23 141 lb 8 oz (64.2 kg)     GENERAL:alert, no distress and comfortable SKIN: skin color, texture, turgor are normal, no rashes or significant lesions EYES: normal, Conjunctiva are pink and non-injected, sclera clear NECK: supple, thyroid  normal size, non-tender, without nodularity LYMPH:  no palpable lymphadenopathy in the cervical, axillary  LUNGS: clear to auscultation and percussion with normal breathing effort HEART: regular rate & rhythm and no murmurs and no lower extremity edema ABDOMEN:abdomen soft, non-tender and normal bowel sounds Musculoskeletal:no cyanosis of digits and no clubbing  NEURO: alert & oriented x 3 with fluent speech, no focal motor/sensory deficits Breasts: Breast inspection showed them to be symmetrical with no nipple discharge. Palpation of the breasts and axilla revealed no obvious mass that I could appreciate.  Physical Exam   LABORATORY DATA:  I have reviewed the data as listed    Latest Ref Rng & Units 10/26/2023   12:40 PM 04/19/2023    1:31 PM 11/19/2022    1:32 PM  CBC  WBC 4.0 - 10.5 K/uL 5.7  4.3  5.7   Hemoglobin 12.0 - 15.0 g/dL 87.9  87.5  87.3   Hematocrit 36.0 - 46.0 % 34.5  35.3  36.5   Platelets 150 - 400 K/uL 264  276  335         Latest Ref Rng & Units 10/26/2023   12:40 PM 04/19/2023    1:31 PM 11/19/2022    1:32 PM  CMP  Glucose 70 - 99 mg/dL 99  95  876   BUN 8 - 23 mg/dL 16  15  15    Creatinine 0.44 - 1.00 mg/dL 9.06  9.00  9.14   Sodium 135 - 145 mmol/L 140  138  136   Potassium 3.5 - 5.1 mmol/L  4.2  4.5  4.1   Chloride 98 - 111 mmol/L 106  103  100   CO2 22 - 32 mmol/L 30  31  31    Calcium  8.9 - 10.3 mg/dL 89.9  9.9  89.8   Total  Protein 6.5 - 8.1 g/dL 6.6  7.1  7.5   Total Bilirubin 0.0 - 1.2 mg/dL 0.3  0.6  0.6   Alkaline Phos 38 - 126 U/L 93  68  84   AST 15 - 41 U/L 15  19  16    ALT 0 - 44 U/L 10  10  13        RADIOGRAPHIC STUDIES: I have personally reviewed the radiological images as listed and agreed with the findings in the report. No results found.    No orders of the defined types were placed in this encounter.  All questions were answered. The patient knows to call the clinic with any problems, questions or concerns. No barriers to learning was detected. The total time spent in the appointment was 30 minutes, including review of chart and various tests results, discussions about plan of care and coordination of care plan     Onita Mattock, MD 10/26/2023

## 2023-10-26 NOTE — Patient Instructions (Signed)

## 2023-10-31 DIAGNOSIS — R7301 Impaired fasting glucose: Secondary | ICD-10-CM | POA: Diagnosis not present

## 2023-10-31 DIAGNOSIS — H0011 Chalazion right upper eyelid: Secondary | ICD-10-CM | POA: Diagnosis not present

## 2023-10-31 DIAGNOSIS — C50911 Malignant neoplasm of unspecified site of right female breast: Secondary | ICD-10-CM | POA: Diagnosis not present

## 2023-10-31 DIAGNOSIS — Z23 Encounter for immunization: Secondary | ICD-10-CM | POA: Diagnosis not present

## 2023-10-31 DIAGNOSIS — E871 Hypo-osmolality and hyponatremia: Secondary | ICD-10-CM | POA: Diagnosis not present

## 2023-10-31 DIAGNOSIS — E7800 Pure hypercholesterolemia, unspecified: Secondary | ICD-10-CM | POA: Diagnosis not present

## 2023-10-31 DIAGNOSIS — Z1331 Encounter for screening for depression: Secondary | ICD-10-CM | POA: Diagnosis not present

## 2023-10-31 DIAGNOSIS — Z0000 Encounter for general adult medical examination without abnormal findings: Secondary | ICD-10-CM | POA: Diagnosis not present

## 2023-10-31 DIAGNOSIS — I10 Essential (primary) hypertension: Secondary | ICD-10-CM | POA: Diagnosis not present

## 2023-11-14 ENCOUNTER — Ambulatory Visit: Attending: General Surgery

## 2023-11-14 VITALS — Wt 140.4 lb

## 2023-11-14 DIAGNOSIS — Z483 Aftercare following surgery for neoplasm: Secondary | ICD-10-CM | POA: Insufficient documentation

## 2023-11-14 NOTE — Therapy (Signed)
 OUTPATIENT PHYSICAL THERAPY SOZO SCREENING NOTE   Patient Name: [name] MRN: [id number] DOB:[birth year], 73 y.o., female Today's Date: 11/14/2023  PCP: [name], PA-C REFERRING PROVIDER: [name], MD   PT End of Session - 11/14/23 1510     Visit Number 2   # unchanged due to screen only   PT Start Time 1506    PT Stop Time 1510    PT Time Calculation (min) 4 min    Activity Tolerance Patient tolerated treatment well    Behavior During Therapy [employer] for tasks assessed/performed          Past Medical History:  Diagnosis Date   Anxiety    takes Xanax  daily    Arthritis    Breast cancer (HCC)    Depression    takes Pristiq  and Wellbutrin  daily   GERD (gastroesophageal reflux disease)    History of radiation therapy    right breast  01/29/2021-03/09/2021  Dr Lynwood Nasuti   Joint pain    Joint swelling    Nocturia    Seasonal allergies    Takes Zyrtec  nightly along with Nasal Spray   Vitamin D deficiency    new script for Vit D to start 08/22/15   Past Surgical History:  Procedure Laterality Date   blephorplasty Bilateral    BREAST LUMPECTOMY WITH RADIOACTIVE SEED AND SENTINEL LYMPH NODE BIOPSY Right 12/03/2020   Procedure: RIGHT BREAST LUMPECTOMY WITH RADIOACTIVE SEEDS X3 AND AXILLARY SENTINEL LYMPH NODE BIOPSY;  Surgeon: [name], MD;  Location: [facility];  Service: General;  Laterality: Right;   BREAST RECONSTRUCTION Right 12/09/2020   Procedure: RIGHT ONCOPLASTIC BREAST RECONSTRUCTION;  Surgeon: Arelia Filippo, MD;  Location: [facility];  Service: Plastics;  Laterality: Right;   BREAST REDUCTION SURGERY Left 12/09/2020   Procedure: LEFT MAMMARY REDUCTION  (BREAST);  Surgeon: Arelia Filippo, MD;  Location: [facility];  Service: Plastics;  Laterality: Left;   COLONOSCOPY     left leg surgery     rod   TOTAL KNEE ARTHROPLASTY Right 09/01/2015   TOTAL KNEE ARTHROPLASTY Right 09/01/2015    Procedure: TOTAL KNEE ARTHROPLASTY;  Surgeon: Dempsey Sensor, MD;  Location: [facility];  Service: Orthopedics;  Laterality: Right;   Patient Active Problem List   Diagnosis Date Noted   Osteopenia after menopause 04/19/2023   Dyslipidemia, goal LDL below 70 07/30/2022   Malignant neoplasm of lower-inner quadrant of right breast of female, estrogen receptor positive (HCC) 10/24/2020   Anxiety 10/28/2016   Allergic rhinitis 10/28/2016   HTN (hypertension) 10/28/2016   Constipation 10/28/2016   Insomnia 10/28/2016   Vitamin D deficiency 10/28/2016   Hyponatremia 10/26/2016   Primary osteoarthritis of right knee 08/28/2015    REFERRING DIAG: right breast cancer at risk for lymphedema  THERAPY DIAG: Aftercare following surgery for neoplasm  PERTINENT HISTORY: Patient was diagnosed on 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 She underwent a right lumpectomy and sentinel node biopsy (4 nodes all negative except one with isolated tumor cells) on 12/03/2020. She then underwent a bilateral breast reduction on 12/09/2020.. It is ER/PR positive and HER2 negative with a Ki67 of 5%.   PRECAUTIONS: right UE Lymphedema risk, None  SUBJECTIVE: Pt returns for her 6 month L-Dex screen.   PAIN:  Are you having pain? No  SOZO SCREENING: Patient was assessed today using the SOZO machine to determine the lymphedema index score. This was compared to her baseline score. It was determined that she is  within the recommended range when compared to her baseline and no further action is needed at this time. She will continue SOZO screenings. These are done every 3 months for 2 years post operatively followed by every 6 months for 2 years, and then annually.   L-DEX FLOWSHEETS - 11/14/23 1500       L-DEX LYMPHEDEMA SCREENING   Measurement Type Unilateral    L-DEX MEASUREMENT EXTREMITY Upper Extremity    POSITION  Standing    DOMINANT SIDE Right    At Risk Side Right    BASELINE SCORE  (UNILATERAL) 0.7    L-DEX SCORE (UNILATERAL) 1    VALUE CHANGE (UNILAT) 0.3         P: Cont 6 month L-Dex screens until 11/2024 then can transition to annual.   Aden Berwyn Caldron, PTA 11/14/2023, 3:11 PM

## 2024-04-25 ENCOUNTER — Inpatient Hospital Stay

## 2024-04-25 ENCOUNTER — Inpatient Hospital Stay: Admitting: Hematology

## 2024-04-25 ENCOUNTER — Inpatient Hospital Stay: Attending: Hematology

## 2024-04-30 ENCOUNTER — Ambulatory Visit: Attending: General Surgery
